# Patient Record
Sex: Female | Born: 1955 | Race: Black or African American | Hispanic: No | Smoking: Former smoker
Health system: Southern US, Community
[De-identification: ages and names within clinical notes are randomized; demographics above are authoritative.]

## PROBLEM LIST (undated history)

## (undated) DIAGNOSIS — G250 Essential tremor: Secondary | ICD-10-CM

## (undated) DIAGNOSIS — M797 Fibromyalgia: Secondary | ICD-10-CM

## (undated) DIAGNOSIS — I10 Essential (primary) hypertension: Secondary | ICD-10-CM

## (undated) DIAGNOSIS — E119 Type 2 diabetes mellitus without complications: Secondary | ICD-10-CM

## (undated) HISTORY — PX: BREAST REDUCTION SURGERY: SHX8

## (undated) HISTORY — DX: Essential (primary) hypertension: I10

## (undated) HISTORY — DX: Fibromyalgia: M79.7

## (undated) HISTORY — DX: Type 2 diabetes mellitus without complications: E11.9

## (undated) HISTORY — DX: Essential tremor: G25.0

---

## 2002-08-27 ENCOUNTER — Encounter: Admission: RE | Admit: 2002-08-27 | Discharge: 2002-11-25 | Payer: Self-pay | Admitting: Family Medicine

## 2003-07-19 ENCOUNTER — Emergency Department (HOSPITAL_COMMUNITY): Admission: EM | Admit: 2003-07-19 | Discharge: 2003-07-20 | Payer: Self-pay | Admitting: Emergency Medicine

## 2003-10-24 ENCOUNTER — Emergency Department (HOSPITAL_COMMUNITY): Admission: EM | Admit: 2003-10-24 | Discharge: 2003-10-24 | Payer: Self-pay

## 2003-11-11 ENCOUNTER — Ambulatory Visit: Payer: Self-pay | Admitting: Family Medicine

## 2003-11-18 ENCOUNTER — Ambulatory Visit: Payer: Self-pay | Admitting: *Deleted

## 2003-11-18 ENCOUNTER — Ambulatory Visit: Payer: Self-pay | Admitting: Family Medicine

## 2003-12-30 ENCOUNTER — Ambulatory Visit: Payer: Self-pay | Admitting: Family Medicine

## 2004-01-08 ENCOUNTER — Ambulatory Visit: Payer: Self-pay | Admitting: Family Medicine

## 2004-01-13 ENCOUNTER — Ambulatory Visit (HOSPITAL_COMMUNITY): Admission: RE | Admit: 2004-01-13 | Discharge: 2004-01-13 | Payer: Self-pay | Admitting: Family Medicine

## 2004-01-28 ENCOUNTER — Ambulatory Visit: Payer: Self-pay | Admitting: Family Medicine

## 2004-02-11 ENCOUNTER — Ambulatory Visit: Payer: Self-pay | Admitting: Family Medicine

## 2004-02-24 ENCOUNTER — Emergency Department (HOSPITAL_COMMUNITY): Admission: EM | Admit: 2004-02-24 | Discharge: 2004-02-24 | Payer: Self-pay | Admitting: Family Medicine

## 2004-03-19 ENCOUNTER — Ambulatory Visit: Payer: Self-pay | Admitting: Family Medicine

## 2004-03-29 ENCOUNTER — Ambulatory Visit: Payer: Self-pay | Admitting: Family Medicine

## 2004-04-22 ENCOUNTER — Ambulatory Visit: Payer: Self-pay | Admitting: Family Medicine

## 2004-04-29 ENCOUNTER — Ambulatory Visit (HOSPITAL_COMMUNITY): Admission: RE | Admit: 2004-04-29 | Discharge: 2004-04-29 | Payer: Self-pay | Admitting: Family Medicine

## 2004-07-13 ENCOUNTER — Emergency Department (HOSPITAL_COMMUNITY): Admission: EM | Admit: 2004-07-13 | Discharge: 2004-07-13 | Payer: Self-pay | Admitting: Family Medicine

## 2004-08-17 ENCOUNTER — Ambulatory Visit: Payer: Self-pay | Admitting: Family Medicine

## 2004-09-28 ENCOUNTER — Ambulatory Visit: Payer: Self-pay | Admitting: Family Medicine

## 2004-12-03 ENCOUNTER — Emergency Department (HOSPITAL_COMMUNITY): Admission: EM | Admit: 2004-12-03 | Discharge: 2004-12-03 | Payer: Self-pay | Admitting: Emergency Medicine

## 2004-12-06 ENCOUNTER — Ambulatory Visit: Payer: Self-pay | Admitting: Family Medicine

## 2006-05-17 ENCOUNTER — Other Ambulatory Visit: Admission: RE | Admit: 2006-05-17 | Discharge: 2006-05-17 | Payer: Self-pay | Admitting: Family Medicine

## 2006-06-01 ENCOUNTER — Ambulatory Visit (HOSPITAL_COMMUNITY): Admission: RE | Admit: 2006-06-01 | Discharge: 2006-06-01 | Payer: Self-pay | Admitting: Gastroenterology

## 2006-06-06 ENCOUNTER — Ambulatory Visit (HOSPITAL_COMMUNITY): Admission: RE | Admit: 2006-06-06 | Discharge: 2006-06-06 | Payer: Self-pay | Admitting: Family Medicine

## 2007-08-29 ENCOUNTER — Inpatient Hospital Stay (HOSPITAL_BASED_OUTPATIENT_CLINIC_OR_DEPARTMENT_OTHER): Admission: RE | Admit: 2007-08-29 | Discharge: 2007-08-29 | Payer: Self-pay | Admitting: Interventional Cardiology

## 2007-09-11 ENCOUNTER — Ambulatory Visit (HOSPITAL_COMMUNITY): Admission: RE | Admit: 2007-09-11 | Discharge: 2007-09-11 | Payer: Self-pay | Admitting: Family Medicine

## 2008-01-01 ENCOUNTER — Other Ambulatory Visit: Admission: RE | Admit: 2008-01-01 | Discharge: 2008-01-01 | Payer: Self-pay | Admitting: Family Medicine

## 2010-03-14 ENCOUNTER — Encounter: Payer: Self-pay | Admitting: Gastroenterology

## 2010-04-07 ENCOUNTER — Other Ambulatory Visit: Payer: Self-pay | Admitting: Obstetrics and Gynecology

## 2010-04-07 ENCOUNTER — Other Ambulatory Visit (HOSPITAL_COMMUNITY): Payer: Self-pay | Admitting: Family Medicine

## 2010-04-07 ENCOUNTER — Other Ambulatory Visit (HOSPITAL_COMMUNITY)
Admission: RE | Admit: 2010-04-07 | Discharge: 2010-04-07 | Disposition: A | Discharge status: Home / Self Care | Payer: Medicare Other | Source: Ambulatory Visit | Attending: Obstetrics and Gynecology | Admitting: Obstetrics and Gynecology

## 2010-04-07 DIAGNOSIS — Z01419 Encounter for gynecological examination (general) (routine) without abnormal findings: Secondary | ICD-10-CM | POA: Insufficient documentation

## 2010-04-07 DIAGNOSIS — Z1231 Encounter for screening mammogram for malignant neoplasm of breast: Secondary | ICD-10-CM

## 2010-04-07 DIAGNOSIS — Z113 Encounter for screening for infections with a predominantly sexual mode of transmission: Secondary | ICD-10-CM | POA: Insufficient documentation

## 2010-04-12 ENCOUNTER — Ambulatory Visit (HOSPITAL_COMMUNITY)
Admission: RE | Admit: 2010-04-12 | Discharge: 2010-04-12 | Disposition: A | Discharge status: Home / Self Care | Payer: Medicare Other | Source: Ambulatory Visit | Attending: Family Medicine | Admitting: Family Medicine

## 2010-04-12 ENCOUNTER — Encounter (HOSPITAL_COMMUNITY): Payer: Self-pay

## 2010-04-12 DIAGNOSIS — Z1231 Encounter for screening mammogram for malignant neoplasm of breast: Secondary | ICD-10-CM | POA: Insufficient documentation

## 2010-07-06 NOTE — Cardiovascular Report (Signed)
Dominique Russell, Dominique Russell              ACCOUNT NO.:  [id number]   MEDICAL RECORD NO.:  [id number]          PATIENT TYPE:  OIB   LOCATION:  1963                         FACILITY:  [facility]   PHYSICIAN:  Corky Crafts, MDDATE OF BIRTH:  25-Feb-1955   DATE OF PROCEDURE:  08/29/2007  DATE OF DISCHARGE:  08/29/2007                            CARDIAC CATHETERIZATION   REFERRING PHYSICIAN:  Tally Joe, MD   REASON FOR VISIT:  Abnormal Cardiolite, diabetes, and chest pain.   PROCEDURES PERFORMED:  Left heart catheterization, left ventriculogram,  coronary angiogram, and abdominal aortogram.   PROCEDURE IN DETAIL:  The risks and benefits of cardiac catheterization  were explained to the patient and informed consent was obtained.  She  was brought to the cardiac cath lab and prepped and draped in the usual  sterile fashion.  Her right groin was infiltrated with 1% lidocaine.  A  4-French sheath was placed into her right femoral artery using the  modified Seldinger technique.  Left coronary artery angiography was  performed using a JL4.0 catheter.  The catheter was advanced to the  vessel ostium under fluoroscopic guidance.  Digital angiography was  performed in multiple projections using hand injection of contrast.  Right coronary artery angiography was performed using a 3DRC catheter.  Digital angiography was performed in multiple projections using hand  injection of contrast.  Pigtail catheter was then advanced to the  ascending aorta and across the aortic valve under fluoroscopic guidance.  Power injection of contrast was performed in the 30-degree RAO position  to image the left ventricle.  The catheter was pulled back under  continuous hemodynamic pressure monitoring.  The catheter was then  withdrawn to the abdominal aorta and power injection of contrast was  performed in the AP projection to image the infrarenal aorta.  The  sheath was removed using manual compression.   FINDINGS:   The left main was a long vessel and widely patent.  The left  circumflex was a large vessel proximally.  There is a medium-sized  branching OM1, which appeared angiographically normal.  The left  anterior descending was a large vessel.  There are mild irregularities  in the mid vessel.  The first and second diagonals were medium-sized and  both widely patent.  The right coronary artery was a large dominant  vessel and appeared angiographically normal.  This vessel branched into  a posterolateral artery and posterior descending artery, both of which  appeared angiographically normal.  The left ventriculogram showed normal  left ventricular function.  No significant mitral regurgitation.  Estimated ejection fraction of 55%.  The abdominal aortogram showed no  abdominal aortic aneurysm.  There were bilateral single renal arteries,  both of which were widely patent.   HEMODYNAMICS:  Left ventricular pressure of 121/24 with an LVEDP of 20  mmHg.  Aortic pressure of 122/77 with a mean aortic pressure of 98 mmHg.   IMPRESSION:  1. No significant coronary artery disease.  2. Normal left ventricular function.  3. No renal artery stenosis.  4. No abdominal aortic aneurysm.  5. Mildly increased left ventricular end-diastolic pressure.  RECOMMENDATIONS:  The patient continue aggressive risk factor  modification including continued diabetes management.  We will hold her  Avandamet for the next 48 hours given the catheterization.  I will  follow up with her in about a week.      Corky Crafts, MD  Electronically Signed     JSV/MEDQ  D:  08/29/2007  T:  08/29/2007  Job:  [id number]

## 2010-07-09 NOTE — Op Note (Signed)
Dominique Russell, Dominique Russell              ACCOUNT NO.:  [id number]   MEDICAL RECORD NO.:  [id number]          PATIENT TYPE:  AMB   LOCATION:  ENDO                         FACILITY:  [facility]   PHYSICIAN:  Shirley Friar, MDDATE OF BIRTH:  [birth year]   DATE OF PROCEDURE:  06/01/2006  DATE OF DISCHARGE:                               OPERATIVE REPORT   PROCEDURE:  Screening colonoscopy.   INDICATIONS:  Screening.   MEDICATIONS:  Fentanyl 50 mcg IV, Versed 2.5 mg IV, propofol infusion  180 mcg per anesthesia.   FINDINGS:  Rectal exam was normal.  Adult Pentax colonoscope was  inserted into a fair prepped colon and advanced to the cecum.  The cecum  had a large amount of semi-formed stool that prevented visualization of  appendiceal orifice.  The ileocecal valve was identified.  The cecum and  right side of the colon, mainly proximal ascending colon was poor prep  and unable to identify any polyps due to the poor prep.  The remaining  prep of the colon was fair and no mucosal abnormalities were seen.  Retroflexion was unremarkable.   ASSESSMENT:  1. Poor prep of cecum and the ascending colon, otherwise normal      colonoscopy.   PLAN:  Air contrast barium enema to view right side of colon.      Shirley Friar, MD  Electronically Signed     VCS/MEDQ  D:  06/01/2006  T:  06/01/2006  Job:  [id number]   cc:   Deatra James, M.D.  Fax: [phone]

## 2011-03-02 DIAGNOSIS — M797 Fibromyalgia: Secondary | ICD-10-CM | POA: Insufficient documentation

## 2011-03-02 DIAGNOSIS — M159 Polyosteoarthritis, unspecified: Secondary | ICD-10-CM | POA: Insufficient documentation

## 2011-03-02 DIAGNOSIS — M069 Rheumatoid arthritis, unspecified: Secondary | ICD-10-CM | POA: Insufficient documentation

## 2011-03-29 ENCOUNTER — Ambulatory Visit (HOSPITAL_COMMUNITY)
Admission: RE | Admit: 2011-03-29 | Discharge: 2011-03-29 | Disposition: A | Discharge status: Home / Self Care | Payer: Medicare Other | Source: Ambulatory Visit | Attending: Rheumatology | Admitting: Rheumatology

## 2011-03-29 ENCOUNTER — Other Ambulatory Visit (HOSPITAL_COMMUNITY): Payer: Self-pay | Admitting: Rheumatology

## 2011-03-29 DIAGNOSIS — E119 Type 2 diabetes mellitus without complications: Secondary | ICD-10-CM | POA: Insufficient documentation

## 2011-03-29 DIAGNOSIS — Z01818 Encounter for other preprocedural examination: Secondary | ICD-10-CM | POA: Insufficient documentation

## 2011-08-31 ENCOUNTER — Other Ambulatory Visit: Payer: Self-pay | Admitting: Diagnostic Neuroimaging

## 2011-08-31 DIAGNOSIS — R259 Unspecified abnormal involuntary movements: Secondary | ICD-10-CM

## 2011-09-07 ENCOUNTER — Ambulatory Visit
Admission: RE | Admit: 2011-09-07 | Discharge: 2011-09-07 | Disposition: A | Discharge status: Home / Self Care | Payer: Medicare Other | Source: Ambulatory Visit | Attending: Diagnostic Neuroimaging | Admitting: Diagnostic Neuroimaging

## 2011-09-07 DIAGNOSIS — R259 Unspecified abnormal involuntary movements: Secondary | ICD-10-CM

## 2012-08-21 ENCOUNTER — Other Ambulatory Visit: Payer: Self-pay | Admitting: Diagnostic Neuroimaging

## 2012-09-26 ENCOUNTER — Ambulatory Visit (INDEPENDENT_AMBULATORY_CARE_PROVIDER_SITE_OTHER): Payer: Medicare Other | Admitting: Diagnostic Neuroimaging

## 2012-09-26 ENCOUNTER — Encounter: Payer: Self-pay | Admitting: Diagnostic Neuroimaging

## 2012-09-26 VITALS — BP 135/83 | HR 95 | Ht 64.0 in | Wt 287.0 lb

## 2012-09-26 DIAGNOSIS — G250 Essential tremor: Secondary | ICD-10-CM | POA: Insufficient documentation

## 2012-09-26 DIAGNOSIS — G252 Other specified forms of tremor: Secondary | ICD-10-CM

## 2012-09-26 DIAGNOSIS — R51 Headache: Secondary | ICD-10-CM

## 2012-09-26 NOTE — Patient Instructions (Signed)
Try increasing primidone to 100mg  every morning and 50mg  every evening.  I will check MRI and labs for headache evaluation.  Use tylenol or ibuprofen as needed for headaches.

## 2012-09-26 NOTE — Progress Notes (Signed)
[facility]  PATIENT: Dominique Russell DOB: 01-08-1956  REFERRING CLINICIAN:  HISTORY FROM: patient and daughter REASON FOR VISIT: follow up    HISTORICAL  CHIEF COMPLAINT:  Chief Complaint  Patient presents with  . Neurologic Problem    RV # 7    HISTORY OF PRESENT ILLNESS:   UPDATE 09/26/12: Tremor slightly progressing, esp with eating food using a fork. Also with new onset severe left sided headache (09/19/12) and went to [facility] for evaluation. CT head unremarkable. Headache is not present today, but is intermittent. Using aspirin and tylenol at home. No N/V/photo/phonophobia.  No similar prior HA.  UPDATE 03/14/12: Doing well, tremor much better with primidone. Taking 50mg  BID. No side effects. Satisfied with current dosing.  UPDATE 10/31/11: Doing better, now that depakote has been stopped. Also on lower gabapentin. Still with action tremor and anxiety. Still with insomnia.   PRIOR HPI: 57 year old right-handed female with history of hypertension, diabetes, fibromyalgia, depression, anxiety, rheumatoid arthritis, here for evaluation of tremor.  In August 2012, patient was visiting out of town, forgetting to take her gabapentin with her and ran out of this medicine 3 days. She thinks that this led to her developing bilateral hand tremor. When she came back home and resume gabapentin her tremor continued. She notices tremor when she drinks from a glass, uses utensils or right with a pen. Tremor has been progressively getting worse over time. She thinks worse when she is more anxious.  REVIEW OF SYSTEMS: Full 14 system review of systems performed and notable only for memory loss headache numbness slurred speech sleepiness anxiety not asleep decreased energy.  ALLERGIES: No Known Allergies  HOME MEDICATIONS: Outpatient Prescriptions Prior to Visit  Medication Sig Dispense Refill  . atorvastatin (LIPITOR) 20 MG tablet Take 20 mg by mouth daily. 1/2  tab qd      . BUPROPION HCL ER, XL, PO Take 300 mg by mouth daily.       . Cholecalciferol (VITAMIN D-3) 1000 UNITS CAPS Take by mouth.      . citalopram (CELEXA) 20 MG tablet Take 20 mg by mouth daily.      . cyclobenzaprine (FLEXERIL) 5 MG tablet Take 5 mg by mouth at bedtime as needed for muscle spasms.      . folic acid (FOLVITE) 800 MCG tablet Take 400 mcg by mouth daily.      Marland Kitchen gabapentin (NEURONTIN) 600 MG tablet Take 600 mg by mouth 4 (four) times daily.      . Glucosamine-Chondroit-Vit C-Mn (GLUCOSAMINE 1500 COMPLEX) CAPS Take by mouth.      . hydrochlorothiazide (HYDRODIURIL) 25 MG tablet Take 25 mg by mouth daily.      Marland Kitchen HYDROcodone-acetaminophen (LORCET) 10-650 MG per tablet Take 1 tablet by mouth 3 (three) times daily.      . hydroxychloroquine (PLAQUENIL) 200 MG tablet Take 200 mg by mouth 2 (two) times daily.      . insulin NPH-regular (NOVOLIN 70/30) (70-30) 100 UNIT/ML injection Inject into the skin. 30 units      . losartan (COZAAR) 100 MG tablet Take 100 mg by mouth daily.      . mirtazapine (REMERON) 15 MG tablet Take 15 mg by mouth at bedtime.      . Omega-3 Fatty Acids (FISH OIL PEARLS) 300 MG CAPS Take 1 tablet by mouth 2 (two) times daily.      . pioglitazone-metformin (ACTOPLUS MET) 15-850 MG per tablet Take 1 tablet by mouth 2 (  two) times daily with a meal.      . potassium gluconate 595 MG TABS tablet Take 595 mg by mouth.      . primidone (MYSOLINE) 50 MG tablet Take 1 tablet (50 mg total) by mouth 2 (two) times daily.  180 tablet  2  . vitamin E 400 UNIT capsule Take 400 Units by mouth daily.       No facility-administered medications prior to visit.    PAST MEDICAL HISTORY: Past Medical History  Diagnosis Date  . Hypertension   . Diabetes   . Fibromyalgia   . Essential tremor     PAST SURGICAL HISTORY: No past surgical history on file.  FAMILY HISTORY: Family History  Problem Relation Age of Onset  . Tremor Neg Hx     SOCIAL HISTORY:  History     Social History  . Marital Status: Legally Separated    Spouse Name: N/A    Number of Children: N/A  . Years of Education: N/A   Occupational History  . Not on file.   Social History Main Topics  . Smoking status: Former Smoker    Quit date: 02/22/1995  . Smokeless tobacco: Never Used  . Alcohol Use: No  . Drug Use: No  . Sexually Active: Not on file   Other Topics Concern  . Not on file   Social History Narrative   Patient lives at home with her daughter.   Caffeine Use: drinks regurlarly     PHYSICAL EXAM  Filed Vitals:   09/26/12 1340  BP: 135/83  Pulse: 95  Height: 5\' 4"  (1.626 m)  Weight: 287 lb (130.182 kg)    Not recorded    Body mass index is 49.24 kg/(m^2).  GENERAL EXAM: General: Patient is awake. No acute distress.  Well developed and groomed. Neck: Neck is supple. Cardiovascular: No carotid artery bruits.  Heart is regular rate and rhythm with no murmurs.  Neurologic Exam  Mental Status: Awake, alert. Language is fluent and comprehension intact. Cranial Nerves: Pupils are equal and reactive to light.  Visual fields are full to confrontation.  Conjugate eye movements are full and symmetric.  Facial sensation and strength are symmetric.  Hearing is intact.  Palate elevated symmetrically and uvula is midline.  Shoulder shrug is symmetric.  Tongue is midline. Motor: NO POSTURAL OR REST TREMOR. MILD ACTION TREMOR (L > R; ESP WITH HAND WRITING). Normal bulk and tone.  Full strength in the upper and lower extremities.  No pronator drift. Sensory: Intact and symmetric to light touch. Coordination: No ataxia or dysmetria on finger-nose or rapid alternating movement testing. Gait and Station: CAUTIOUS GAIT. Romberg is negative. Reflexes: Deep tendon reflexes in the upper and lower extremity are TRACE and symmetric; ABSENT AT ANKLES.   DIAGNOSTIC DATA (LABS, IMAGING, TESTING) - I reviewed patient records, labs, notes, testing and imaging myself where  available.  No results found for this basename: WBC,  HGB,  HCT,  MCV,  PLT   No results found for this basename: na,  k,  cl,  co2,  glucose,  bun,  creatinine,  calcium,  prot,  albumin,  ast,  alt,  alkphos,  bilitot,  gfrnonaa,  gfraa   No results found for this basename: CHOL,  HDL,  LDLCALC,  LDLDIRECT,  TRIG,  CHOLHDL   No results found for this basename: HGBA1C   No results found for this basename: VITAMINB12   No results found for this basename: TSH  ASSESSMENT AND PLAN  57 y.o. female with progressive postural and action tremor. Tremor has improved somewhat since stoping depakote and reducing gabapentin. She likely has underlying essential tremor as well.  TSH 1.18  Ddx tremor: essential tremor + polypharmacy  Ddx headache: migraine, temporal arteritis, secondary headache   PLAN: 1. increase primidone to 100mg  qAM and 50mg  qPM 2. MRI brain , ESR, CRP for headache eval   Orders Placed This Encounter  Procedures  . MR Brain W Wo Contrast  . Sedimentation Rate  . C-reactive Protein     Suanne Marker, MD 09/26/2012, 2:07 PM Certified in Neurology, Neurophysiology and Neuroimaging  [facility] [address] [place], [place] [address] [phone]

## 2012-10-26 DIAGNOSIS — R51 Headache: Secondary | ICD-10-CM

## 2012-11-02 ENCOUNTER — Other Ambulatory Visit: Payer: Self-pay | Admitting: Diagnostic Neuroimaging

## 2012-11-02 DIAGNOSIS — G250 Essential tremor: Secondary | ICD-10-CM

## 2012-11-02 DIAGNOSIS — R51 Headache: Secondary | ICD-10-CM

## 2012-12-26 DIAGNOSIS — M25511 Pain in right shoulder: Secondary | ICD-10-CM | POA: Insufficient documentation

## 2012-12-26 DIAGNOSIS — M25561 Pain in right knee: Secondary | ICD-10-CM | POA: Insufficient documentation

## 2012-12-28 ENCOUNTER — Ambulatory Visit: Payer: Medicare Other | Admitting: Diagnostic Neuroimaging

## 2013-01-23 DIAGNOSIS — M545 Low back pain, unspecified: Secondary | ICD-10-CM | POA: Insufficient documentation

## 2013-01-23 DIAGNOSIS — E1142 Type 2 diabetes mellitus with diabetic polyneuropathy: Secondary | ICD-10-CM | POA: Insufficient documentation

## 2013-01-23 DIAGNOSIS — M25519 Pain in unspecified shoulder: Secondary | ICD-10-CM | POA: Insufficient documentation

## 2013-01-23 DIAGNOSIS — E119 Type 2 diabetes mellitus without complications: Secondary | ICD-10-CM | POA: Insufficient documentation

## 2013-01-31 ENCOUNTER — Ambulatory Visit (INDEPENDENT_AMBULATORY_CARE_PROVIDER_SITE_OTHER): Payer: Medicare Other | Admitting: Diagnostic Neuroimaging

## 2013-01-31 ENCOUNTER — Encounter: Payer: Self-pay | Admitting: Diagnostic Neuroimaging

## 2013-01-31 ENCOUNTER — Encounter (INDEPENDENT_AMBULATORY_CARE_PROVIDER_SITE_OTHER): Payer: Self-pay

## 2013-01-31 VITALS — BP 137/82 | HR 95 | Temp 98.0°F | Ht 64.0 in | Wt 287.0 lb

## 2013-01-31 DIAGNOSIS — G250 Essential tremor: Secondary | ICD-10-CM

## 2013-01-31 MED ORDER — PRIMIDONE 50 MG PO TABS: 100.0000 mg | ORAL_TABLET | Freq: Two times a day (BID) | ORAL | Status: DC

## 2013-01-31 NOTE — Patient Instructions (Signed)
-    Increase primidone to 100mg twice a day 

## 2013-01-31 NOTE — Progress Notes (Signed)
[facility]  PATIENT: Dominique Russell DOB: [birth year]  REFERRING CLINICIAN:  HISTORY FROM: patient and daughter REASON FOR VISIT: follow up    HISTORICAL  CHIEF COMPLAINT:  Chief Complaint  Patient presents with  . Follow-up    essential tremor    HISTORY OF PRESENT ILLNESS:   UPDATE 01/31/13: Patient's headache has resolved. MRI of the brain comment ESR and CRP were unremarkable. Patient's tremor has continued. She has particular difficulty holding up today, holding a cup of coffee a spoon or fork. Patient is taking primidone 50 mg morning 100 mg a day. Patient also complaining of acid reflux symptoms.  UPDATE 09/26/12: Tremor slightly progressing, esp with eating food using a fork. Also with new onset severe left sided headache (09/19/12) and went to [facility] for evaluation. CT head unremarkable. Headache is not present today, but is intermittent. Using aspirin and tylenol at home. No N/V/photo/phonophobia.  No similar prior HA.  UPDATE 03/14/12: Doing well, tremor much better with primidone. Taking 50mg  BID. No side effects. Satisfied with current dosing.  UPDATE 10/31/11: Doing better, now that depakote has been stopped. Also on lower gabapentin. Still with action tremor and anxiety. Still with insomnia.   PRIOR HPI: 57 year old right-handed female with history of hypertension, diabetes, fibromyalgia, depression, anxiety, rheumatoid arthritis, here for evaluation of tremor.  In August 2012, patient was visiting out of town, forgetting to take her gabapentin with her and ran out of this medicine 3 days. She thinks that this led to her developing bilateral hand tremor. When she came back home and resume gabapentin her tremor continued. She notices tremor when she drinks from a glass, uses utensils or right with a pen. Tremor has been progressively getting worse over time. She thinks worse when she is more anxious.  REVIEW OF SYSTEMS: Full 14 system  review of systems performed and notable only for aching muscles depression nervousness anxiety.   ALLERGIES: No Known Allergies  HOME MEDICATIONS: Outpatient Prescriptions Prior to Visit  Medication Sig Dispense Refill  . ALPRAZolam (XANAX) 0.5 MG tablet       . atorvastatin (LIPITOR) 20 MG tablet Take 20 mg by mouth daily. 1/2 tab qd      . BUPROPION HCL ER, XL, PO Take 300 mg by mouth daily.       . Cholecalciferol (VITAMIN D-3) 1000 UNITS CAPS Take by mouth.      . citalopram (CELEXA) 20 MG tablet Take 20 mg by mouth daily.      . cyclobenzaprine (FLEXERIL) 5 MG tablet Take 5 mg by mouth at bedtime as needed for muscle spasms.      . folic acid (FOLVITE) 800 MCG tablet Take 400 mcg by mouth daily.      Marland Kitchen gabapentin (NEURONTIN) 600 MG tablet Take 600 mg by mouth 4 (four) times daily.      . Glucosamine-Chondroit-Vit C-Mn (GLUCOSAMINE 1500 COMPLEX) CAPS Take by mouth.      . hydrochlorothiazide (HYDRODIURIL) 25 MG tablet Take 25 mg by mouth daily.      . insulin NPH-regular (NOVOLIN 70/30) (70-30) 100 UNIT/ML injection Inject into the skin. 30 units      . losartan (COZAAR) 100 MG tablet Take 100 mg by mouth daily.      . mirtazapine (REMERON) 15 MG tablet Take 15 mg by mouth at bedtime.      . Omega-3 Fatty Acids (FISH OIL PEARLS) 300 MG CAPS Take 1 tablet by mouth 2 (two) times  daily.      . pioglitazone-metformin (ACTOPLUS MET) 15-850 MG per tablet Take 1 tablet by mouth 2 (two) times daily with a meal.      . potassium gluconate 595 MG TABS tablet Take 595 mg by mouth.      . vitamin E 400 UNIT capsule Take 400 Units by mouth daily.      Marland Kitchen HYDROcodone-acetaminophen (LORCET) 10-650 MG per tablet Take 1 tablet by mouth 3 (three) times daily.      . primidone (MYSOLINE) 50 MG tablet Take 1 tablet (50 mg total) by mouth 2 (two) times daily.  180 tablet  2  . hydroxychloroquine (PLAQUENIL) 200 MG tablet Take 200 mg by mouth 2 (two) times daily.       No facility-administered medications  prior to visit.    PAST MEDICAL HISTORY: Past Medical History  Diagnosis Date  . Hypertension   . Diabetes   . Fibromyalgia   . Essential tremor     PAST SURGICAL HISTORY: History reviewed. No pertinent past surgical history.  FAMILY HISTORY: Family History  Problem Relation Age of Onset  . Tremor Neg Hx     SOCIAL HISTORY:  History   Social History  . Marital Status: Legally Separated    Spouse Name: N/A    Number of Children: 2  . Years of Education: HS   Occupational History  . Not on file.   Social History Main Topics  . Smoking status: Former Smoker    Quit date: 02/22/1995  . Smokeless tobacco: Never Used  . Alcohol Use: No  . Drug Use: No  . Sexual Activity: Not on file   Other Topics Concern  . Not on file   Social History Narrative   Patient lives at home with her daughter.   Caffeine Use: drinks regurlarly     PHYSICAL EXAM  Filed Vitals:   01/31/13 1444  BP: 137/82  Pulse: 95  Temp: 98 F (36.7 C)  TempSrc: Oral  Height: 5\' 4"  (1.626 m)  Weight: 287 lb (130.182 kg)    Not recorded    Body mass index is 49.24 kg/(m^2).  GENERAL EXAM: General: Patient is awake. No acute distress.  Well developed and groomed. Neck: Neck is supple. Cardiovascular: No carotid artery bruits.  Heart is regular rate and rhythm with no murmurs.  Neurologic Exam  Mental Status: Awake, alert. Language is fluent and comprehension intact. Cranial Nerves: Pupils are equal and reactive to light.  Visual fields are full to confrontation.  Conjugate eye movements are full and symmetric.  Facial sensation and strength are symmetric.  Hearing is intact.  Palate elevated symmetrically and uvula is midline.  Shoulder shrug is symmetric.  Tongue is midline. Motor: NO POSTURAL OR REST TREMOR. Normal bulk and tone.  Full strength in the upper and lower extremities.  No pronator drift. Sensory: Intact and symmetric to light touch. Coordination: MILD ACTION TREMOR (L >  R; ESP WITH HAND WRITING). No ataxia or dysmetria on finger-nose or rapid alternating movement testing. Gait and Station: CAUTIOUS GAIT. Romberg is negative. Reflexes: Deep tendon reflexes in the upper and lower extremity are TRACE and symmetric; ABSENT AT ANKLES.   DIAGNOSTIC DATA (LABS, IMAGING, TESTING) - I reviewed patient records, labs, notes, testing and imaging myself where available.  No results found for this basename: WBC,  HGB,  HCT,  MCV,  PLT   No results found for this basename: na,  k,  cl,  co2,  glucose,  bun,  creatinine,  calcium,  prot,  albumin,  ast,  alt,  alkphos,  bilitot,  gfrnonaa,  gfraa   No results found for this basename: CHOL,  HDL,  LDLCALC,  LDLDIRECT,  TRIG,  CHOLHDL   No results found for this basename: HGBA1C   No results found for this basename: VITAMINB12   No results found for this basename: TSH    Sed Rate  Date Value Range Status  09/26/2012 27  0 - 40 mm/hr Final   CRP  Date Value Range Status  09/26/2012 2.3  0.0 - 4.9 mg/L Final    I reviewed images myself.   11/02/12 MRI brain - normal   ASSESSMENT AND PLAN  57 y.o. female with progressive postural and action tremor. Tremor has improved somewhat since stoping depakote and reducing gabapentin. She likely has underlying essential tremor as well.  Dx tremor: essential tremor (worsening)  Dx headache: migraine vs tension HA (resolved)  PLAN: 1. increase primidone to 100mg  BID  Return in about 3 months (around 05/01/2013) for with Heide Guile or Penumalli.    Suanne Marker, MD 01/31/2013, 3:23 PM Certified in Neurology, Neurophysiology and Neuroimaging  [facility] [address] [place], [place] [address] [phone]

## 2013-03-06 ENCOUNTER — Telehealth: Payer: Self-pay | Admitting: Diagnostic Neuroimaging

## 2013-03-06 MED ORDER — PRIMIDONE 50 MG PO TABS: 100.0000 mg | ORAL_TABLET | Freq: Two times a day (BID) | ORAL | Status: DC

## 2013-03-06 NOTE — Telephone Encounter (Signed)
NEEDS RX CALLED IN FOR PREMIDONE--[facility] [address]-NEEDS 3 MONTHS SUPPLY

## 2013-03-06 NOTE — Telephone Encounter (Signed)
Rx has been sent  

## 2013-03-06 NOTE — Telephone Encounter (Signed)
NEEDS REFILL FOR PREMIDONE-[facility]-NEEDS 3 MONTHS SUPPLY

## 2013-05-01 ENCOUNTER — Telehealth: Payer: Self-pay | Admitting: Nurse Practitioner

## 2013-05-01 ENCOUNTER — Ambulatory Visit: Payer: Medicare Other | Admitting: Nurse Practitioner

## 2013-05-01 NOTE — Telephone Encounter (Signed)
Patient was no show for appointment

## 2013-07-31 ENCOUNTER — Telehealth: Payer: Self-pay | Admitting: Diagnostic Neuroimaging

## 2013-07-31 ENCOUNTER — Other Ambulatory Visit: Payer: Self-pay | Admitting: Diagnostic Neuroimaging

## 2013-07-31 MED ORDER — PRIMIDONE 50 MG PO TABS: 100.0000 mg | ORAL_TABLET | Freq: Two times a day (BID) | ORAL | Status: DC

## 2013-07-31 NOTE — Telephone Encounter (Signed)
Called pt and left message informing her that her Rx was sent over to her Pharmacy and if she has any other problems, questions or concerns to call the office.

## 2013-07-31 NOTE — Telephone Encounter (Signed)
Patient requesting refill for primidone (MYSOLINE) 50 MG tablet.  She stated Dr Marjory Lies instructed her to take 2 tabs in am and pm, and she has run out before next refill is due.  Please call and advise

## 2013-09-26 NOTE — Telephone Encounter (Signed)
Noted  

## 2014-01-03 ENCOUNTER — Other Ambulatory Visit: Payer: Self-pay | Admitting: Diagnostic Neuroimaging

## 2014-01-06 ENCOUNTER — Other Ambulatory Visit (HOSPITAL_COMMUNITY): Payer: Self-pay | Admitting: Family Medicine

## 2014-01-06 DIAGNOSIS — Z1231 Encounter for screening mammogram for malignant neoplasm of breast: Secondary | ICD-10-CM

## 2014-01-14 ENCOUNTER — Ambulatory Visit (HOSPITAL_COMMUNITY)
Admission: RE | Admit: 2014-01-14 | Discharge: 2014-01-14 | Disposition: A | Discharge status: Home / Self Care | Payer: Medicare Other | Source: Ambulatory Visit | Attending: Family Medicine | Admitting: Family Medicine

## 2014-01-14 DIAGNOSIS — Z1231 Encounter for screening mammogram for malignant neoplasm of breast: Secondary | ICD-10-CM

## 2014-01-27 ENCOUNTER — Other Ambulatory Visit: Payer: Self-pay | Admitting: Diagnostic Neuroimaging

## 2014-01-27 NOTE — Telephone Encounter (Signed)
Patient no showed last appt  

## 2014-01-28 ENCOUNTER — Other Ambulatory Visit: Payer: Self-pay | Admitting: Obstetrics & Gynecology

## 2014-01-28 ENCOUNTER — Other Ambulatory Visit (HOSPITAL_COMMUNITY)
Admission: RE | Admit: 2014-01-28 | Discharge: 2014-01-28 | Disposition: A | Discharge status: Home / Self Care | Payer: Medicare Other | Source: Ambulatory Visit | Attending: Obstetrics & Gynecology | Admitting: Obstetrics & Gynecology

## 2014-01-28 DIAGNOSIS — Z1151 Encounter for screening for human papillomavirus (HPV): Secondary | ICD-10-CM | POA: Insufficient documentation

## 2014-01-28 DIAGNOSIS — Z124 Encounter for screening for malignant neoplasm of cervix: Secondary | ICD-10-CM | POA: Insufficient documentation

## 2014-01-28 NOTE — Telephone Encounter (Signed)
I called th epatient, got no answer.  Left message.  

## 2014-01-29 ENCOUNTER — Telehealth: Payer: Self-pay | Admitting: Diagnostic Neuroimaging

## 2014-01-29 NOTE — Telephone Encounter (Signed)
Pharmacist, Annabelle Harman with [facility] @ [phone], checking status of Rx refill request for primidone (MYSOLINE) 50 MG tablet.  Please call and advise.

## 2014-01-29 NOTE — Telephone Encounter (Signed)
We have called the patient and left a message.  I called the pharmacy back.  They will try to reach out to the patient as well.

## 2014-01-30 ENCOUNTER — Encounter: Payer: Self-pay | Admitting: Diagnostic Neuroimaging

## 2014-01-30 ENCOUNTER — Ambulatory Visit (INDEPENDENT_AMBULATORY_CARE_PROVIDER_SITE_OTHER): Payer: Medicare Other | Admitting: Diagnostic Neuroimaging

## 2014-01-30 VITALS — BP 130/86 | HR 83 | Temp 97.8°F | Ht 64.0 in | Wt 270.4 lb

## 2014-01-30 DIAGNOSIS — G250 Essential tremor: Secondary | ICD-10-CM

## 2014-01-30 LAB — CYTOLOGY - PAP

## 2014-01-30 MED ORDER — TOPIRAMATE 50 MG PO TABS: 50.0000 mg | ORAL_TABLET | Freq: Two times a day (BID) | ORAL | Status: DC

## 2014-01-30 NOTE — Patient Instructions (Signed)
Continue primidone.  Add topiramate 50mg  twice a day.

## 2014-01-30 NOTE — Progress Notes (Signed)
[facility]  PATIENT: Dominique Russell DOB: 1955-11-01  REFERRING CLINICIAN:  HISTORY FROM: patient and daughter REASON FOR VISIT: follow up    HISTORICAL  CHIEF COMPLAINT:  Chief Complaint  Patient presents with  . Follow-up    tremor    HISTORY OF PRESENT ILLNESS:   UPDATE 12/101/15: Since last visit, doing about the same. Tremor stable, but not better since increasing primidone last year. Some sleepiness with primidone.  UPDATE 01/31/13: Patient's headache has resolved. MRI of the brain comment ESR and CRP were unremarkable. Patient's tremor has continued. She has particular difficulty holding up today, holding a cup of coffee a spoon or fork. Patient is taking primidone 50 mg morning 100 mg a day. Patient also complaining of acid reflux symptoms.  UPDATE 09/26/12: Tremor slightly progressing, esp with eating food using a fork. Also with new onset severe left sided headache (09/19/12) and went to [place] for evaluation. CT head unremarkable. Headache is not present today, but is intermittent. Using aspirin and tylenol at home. No N/V/photo/phonophobia.  No similar prior HA.  UPDATE 03/14/12: Doing well, tremor much better with primidone. Taking 30m BID. No side effects. Satisfied with current dosing.  UPDATE 10/31/11: Doing better, now that depakote has been stopped. Also on lower gabapentin. Still with action tremor and anxiety. Still with insomnia.   PRIOR HPI: 58year old right-handed female with history of hypertension, diabetes, fibromyalgia, depression, anxiety, rheumatoid arthritis, here for evaluation of tremor.  In August 2012, patient was visiting out of town, forgetting to take her gabapentin with her and ran out of this medicine 3 days. She thinks that this led to her developing bilateral hand tremor. When she came back home and resume gabapentin her tremor continued. She notices tremor when she drinks from a glass, uses utensils or right  with a pen. Tremor has been progressively getting worse over time. She thinks worse when she is more anxious.  REVIEW OF SYSTEMS: Full 14 system review of systems performed and notable only for headache numbness depression anxiety cramps joint pain back pain restless legs.   ALLERGIES: No Known Allergies  HOME MEDICATIONS: Outpatient Prescriptions Prior to Visit  Medication Sig Dispense Refill  . ALPRAZolam (XANAX) 0.5 MG tablet     . atorvastatin (LIPITOR) 20 MG tablet Take 20 mg by mouth daily. 1/2 tab qd    . BUPROPION HCL ER, XL, PO Take 300 mg by mouth daily.     . Cholecalciferol (VITAMIN D-3) 1000 UNITS CAPS Take by mouth.    . citalopram (CELEXA) 20 MG tablet Take 20 mg by mouth daily.    . cyclobenzaprine (FLEXERIL) 5 MG tablet Take 5 mg by mouth at bedtime as needed for muscle spasms.    . folic acid (FOLVITE) 8245MCG tablet Take 400 mcg by mouth daily.    .Marland Kitchengabapentin (NEURONTIN) 600 MG tablet Take 600 mg by mouth 4 (four) times daily.    . Glucosamine-Chondroit-Vit C-Mn (GLUCOSAMINE 1500 COMPLEX) CAPS Take by mouth.    . hydrochlorothiazide (HYDRODIURIL) 25 MG tablet Take 25 mg by mouth daily.    . insulin NPH-regular (NOVOLIN 70/30) (70-30) 100 UNIT/ML injection Inject into the skin. 30 units    . losartan (COZAAR) 100 MG tablet Take 100 mg by mouth daily.    . mirtazapine (REMERON) 15 MG tablet Take 15 mg by mouth at bedtime.    . Omega-3 Fatty Acids (FISH OIL PEARLS) 300 MG CAPS Take 1 tablet by mouth  2 (two) times daily.    . pioglitazone-metformin (ACTOPLUS MET) 15-850 MG per tablet Take 1 tablet by mouth 2 (two) times daily with a meal.    . potassium gluconate 595 MG TABS tablet Take 595 mg by mouth.    . primidone (MYSOLINE) 50 MG tablet Take 2 tablets (100 mg total) by mouth 2 (two) times daily. 60 tablet 0  . vitamin E 400 UNIT capsule Take 400 Units by mouth daily.     No facility-administered medications prior to visit.    PAST MEDICAL HISTORY: Past Medical  History  Diagnosis Date  . Hypertension   . Diabetes   . Fibromyalgia   . Essential tremor     PAST SURGICAL HISTORY: No past surgical history on file.  FAMILY HISTORY: Family History  Problem Relation Age of Onset  . Tremor Neg Hx     SOCIAL HISTORY:  History   Social History  . Marital Status: Legally Separated    Spouse Name: N/A    Number of Children: 2  . Years of Education: HS   Occupational History  . Not on file.   Social History Main Topics  . Smoking status: Former Smoker    Quit date: 02/22/1995  . Smokeless tobacco: Never Used  . Alcohol Use: No  . Drug Use: No  . Sexual Activity: Not on file   Other Topics Concern  . Not on file   Social History Narrative   Patient lives at home with her daughter.   Caffeine Use: drinks regurlarly     PHYSICAL EXAM  Filed Vitals:   01/30/14 1530  BP: 130/86  Pulse: 83  Temp: 97.8 F (36.6 C)  TempSrc: Oral  Height: 5' 4" (1.626 m)  Weight: 270 lb 6.4 oz (122.653 kg)    Not recorded      Body mass index is 46.39 kg/(m^2).  GENERAL EXAM: General: Patient is awake. No acute distress.  Well developed and groomed. Neck: Neck is supple. Cardiovascular: No carotid artery bruits.  Heart is regular rate and rhythm with no murmurs.  Neurologic Exam  Mental Status: Awake, alert. Language is fluent and comprehension intact. Cranial Nerves: Pupils are equal and reactive to light.  Visual fields are full to confrontation.  Conjugate eye movements are full and symmetric.  Facial sensation and strength are symmetric.  Hearing is intact.  Palate elevated symmetrically and uvula is midline.  Shoulder shrug is symmetric.  Tongue is midline. Motor: NO POSTURAL OR REST TREMOR. Normal bulk and tone.  Full strength in the upper and lower extremities.  No pronator drift. Sensory: Intact and symmetric to light touch. Coordination: MILD ACTION TREMOR (L > R; ESP WITH HAND WRITING). No ataxia or dysmetria on finger-nose or  rapid alternating movement testing. Gait and Station: CAUTIOUS GAIT. Romberg is negative. Reflexes: Deep tendon reflexes in the upper and lower extremity are TRACE and symmetric; ABSENT AT ANKLES.   DIAGNOSTIC DATA (LABS, IMAGING, TESTING) - I reviewed patient records, labs, notes, testing and imaging myself where available.  No results found for: WBC No results found for: NA No results found for: CHOL No results found for: HGBA1C No results found for: VITAMINB12 No results found for: TSH  SED RATE  Date Value Ref Range Status  09/26/2012 27 0 - 40 mm/hr Final   CRP  Date Value Ref Range Status  09/26/2012 2.3 0.0 - 4.9 mg/L Final    I reviewed images myself.   11/02/12 MRI brain - normal  ASSESSMENT AND PLAN  58 y.o. female with progressive postural and action tremor. Tremor has improved somewhat since stoping depakote and reducing gabapentin. She likely has underlying essential tremor as well.  Dx tremor: essential tremor (worsening)  Dx headache: migraine vs tension HA (resolved)  PLAN: 1. Continue primidone to 119m BID 2. Add on topiramate 568mBID for tremor and migraine control  Meds ordered this encounter  Medications  . topiramate (TOPAMAX) 50 MG tablet    Sig: Take 1 tablet (50 mg total) by mouth 2 (two) times daily.    Dispense:  60 tablet    Refill:  12   Return in about 6 months (around 08/01/2014).    VIPenni BombardMD [id number]:2:95M Certified in Neurology, Neurophysiology and Neuroimaging  [facility] [address]SuPrairie CityrWoodlawn[place] [id number]

## 2014-05-16 DIAGNOSIS — Z6841 Body Mass Index (BMI) 40.0 and over, adult: Secondary | ICD-10-CM

## 2014-07-01 ENCOUNTER — Telehealth: Payer: Self-pay

## 2014-07-01 NOTE — Telephone Encounter (Signed)
Pt was contacted to reschedule appointment from bump list. VM was left informing pt to call back.  When rescheduling please schedule with Dr. Marjory Lies in a 20 min slot.  Thanks

## 2014-07-31 ENCOUNTER — Ambulatory Visit: Payer: Medicare Other | Admitting: Diagnostic Neuroimaging

## 2014-08-06 ENCOUNTER — Ambulatory Visit (INDEPENDENT_AMBULATORY_CARE_PROVIDER_SITE_OTHER): Payer: Medicare Other | Admitting: Diagnostic Neuroimaging

## 2014-08-06 ENCOUNTER — Encounter: Payer: Self-pay | Admitting: Diagnostic Neuroimaging

## 2014-08-06 ENCOUNTER — Telehealth: Payer: Self-pay | Admitting: *Deleted

## 2014-08-06 VITALS — BP 126/88 | HR 92 | Ht 64.0 in | Wt 258.2 lb

## 2014-08-06 DIAGNOSIS — G250 Essential tremor: Secondary | ICD-10-CM | POA: Diagnosis not present

## 2014-08-06 DIAGNOSIS — R51 Headache: Secondary | ICD-10-CM

## 2014-08-06 DIAGNOSIS — R519 Headache, unspecified: Secondary | ICD-10-CM

## 2014-08-06 MED ORDER — TOPIRAMATE 100 MG PO TABS: 100.0000 mg | ORAL_TABLET | Freq: Two times a day (BID) | ORAL | Status: DC

## 2014-08-06 NOTE — Patient Instructions (Signed)
Increase topiramate to 100mg twice a day. 

## 2014-08-06 NOTE — Progress Notes (Signed)
[facility]  PATIENT: Dominique Russell DOB: [birth year]  REFERRING CLINICIAN:  HISTORY FROM: patient and daughter REASON FOR VISIT: follow up    HISTORICAL  CHIEF COMPLAINT:  Chief Complaint  Patient presents with  . Tremors    rm 7, daughter, "temors not getting better"  . Follow-up    HISTORY OF PRESENT ILLNESS:   UPDATE 08/06/14: Since last visit, tremor stable to slightly worse. HA much better. Having diff with ADLs, transportation.   UPDATE 12/101/15: Since last visit, doing about the same. Tremor stable, but not better since increasing primidone last year. Some sleepiness with primidone.  UPDATE 01/31/13: Patient's headache has resolved. MRI of the brain comment ESR and CRP were unremarkable. Patient's tremor has continued. She has particular difficulty holding up today, holding a cup of coffee a spoon or fork. Patient is taking primidone 50 mg morning 100 mg a day. Patient also complaining of acid reflux symptoms.  UPDATE 09/26/12: Tremor slightly progressing, esp with eating food using a fork. Also with new onset severe left sided headache (09/19/12) and went to [place] for evaluation. CT head unremarkable. Headache is not present today, but is intermittent. Using aspirin and tylenol at home. No N/V/photo/phonophobia.  No similar prior HA.  UPDATE 03/14/12: Doing well, tremor much better with primidone. Taking 57m BID. No side effects. Satisfied with current dosing.  UPDATE 10/31/11: Doing better, now that depakote has been stopped. Also on lower gabapentin. Still with action tremor and anxiety. Still with insomnia.   PRIOR HPI: 59year old right-handed female with history of hypertension, diabetes, fibromyalgia, depression, anxiety, rheumatoid arthritis, here for evaluation of tremor. In August 2012, patient was visiting out of town, forgetting to take her gabapentin with her and ran out of this medicine 3 days. She thinks that this led to her  developing bilateral hand tremor. When she came back home and resume gabapentin her tremor continued. She notices tremor when she drinks from a glass, uses utensils or right with a pen. Tremor has been progressively getting worse over time. She thinks worse when she is more anxious.  REVIEW OF SYSTEMS: Full 14 system review of systems performed and notable only for headache numbness depression anxiety cramps joint pain back pain restless legs.   ALLERGIES: No Known Allergies  HOME MEDICATIONS: Outpatient Prescriptions Prior to Visit  Medication Sig Dispense Refill  . ALPRAZolam (XANAX) 0.5 MG tablet     . atorvastatin (LIPITOR) 20 MG tablet Take 20 mg by mouth daily. 1/2 tab qd    . BUPROPION HCL ER, XL, PO Take 300 mg by mouth daily.     . Cholecalciferol (VITAMIN D-3) 1000 UNITS CAPS Take by mouth.    . citalopram (CELEXA) 20 MG tablet Take 20 mg by mouth daily.    . cyclobenzaprine (FLEXERIL) 5 MG tablet Take 5 mg by mouth at bedtime as needed for muscle spasms.    . folic acid (FOLVITE) 8329MCG tablet Take 400 mcg by mouth daily.    .Marland Kitchengabapentin (NEURONTIN) 600 MG tablet Take 600 mg by mouth 4 (four) times daily.    . Glucosamine-Chondroit-Vit C-Mn (GLUCOSAMINE 1500 COMPLEX) CAPS Take by mouth.    . hydrochlorothiazide (HYDRODIURIL) 25 MG tablet Take 25 mg by mouth daily.    .Marland Kitchenlosartan (COZAAR) 100 MG tablet Take 100 mg by mouth daily.    .Marland KitchenLYRICA 150 MG capsule Take 1 tablet by mouth 3 (three) times daily.    . mirtazapine (REMERON) 15  MG tablet Take 15 mg by mouth at bedtime.    . Omega-3 Fatty Acids (FISH OIL PEARLS) 300 MG CAPS Take 1 tablet by mouth 2 (two) times daily.    Marland Kitchen omeprazole (PRILOSEC) 40 MG capsule Take 1 capsule by mouth daily.    . pioglitazone-metformin (ACTOPLUS MET) 15-850 MG per tablet Take 1 tablet by mouth 2 (two) times daily with a meal.    . potassium gluconate 595 MG TABS tablet Take 595 mg by mouth.    . primidone (MYSOLINE) 50 MG tablet TAKE 2 TABLETS  (100 MG TOTAL) BY MOUTH 2 (TWO) TIMES DAILY. 60 tablet 12  . vitamin E 400 UNIT capsule Take 400 Units by mouth daily.    Marland Kitchen topiramate (TOPAMAX) 50 MG tablet Take 1 tablet (50 mg total) by mouth 2 (two) times daily. 60 tablet 12  . insulin NPH-regular (NOVOLIN 70/30) (70-30) 100 UNIT/ML injection Inject into the skin. 30 units     No facility-administered medications prior to visit.    PAST MEDICAL HISTORY: Past Medical History  Diagnosis Date  . Hypertension   . Diabetes   . Fibromyalgia   . Essential tremor     PAST SURGICAL HISTORY: No past surgical history on file.  FAMILY HISTORY: Family History  Problem Relation Age of Onset  . Tremor Neg Hx   . Depression Mother   . Hypertension Mother   . Kidney failure Brother     SOCIAL HISTORY:  History   Social History  . Marital Status: Legally Separated    Spouse Name: N/A  . Number of Children: 2  . Years of Education: HS   Occupational History  . Not on file.   Social History Main Topics  . Smoking status: Former Smoker    Quit date: 02/22/1995  . Smokeless tobacco: Never Used  . Alcohol Use: No  . Drug Use: No  . Sexual Activity: Not on file   Other Topics Concern  . Not on file   Social History Narrative   Patient lives at home with her daughter.   Caffeine Use: drinks regurlarly     PHYSICAL EXAM  Filed Vitals:   08/06/14 1507  BP: 126/88  Pulse: 92  Height: _0  (1.626 m)  Weight: 258 lb 3.2 oz (117.119 kg)    Not recorded      Body mass index is 44.3 kg/(m^2).  GENERAL EXAM: General: Patient is awake. No acute distress.  Well developed and groomed. HEAD AND VOICE TREMOR Neck: Neck is supple. Cardiovascular: No carotid artery bruits.  Heart is regular rate and rhythm with no murmurs.  Neurologic Exam  Mental Status: Awake, alert. Language is fluent and comprehension intact. Cranial Nerves: Pupils are equal and reactive to light.  Visual fields are full to confrontation.  Conjugate  eye movements are full and symmetric.  Facial sensation and strength are symmetric.  Hearing is intact.  Palate elevated symmetrically and uvula is midline.  Shoulder shrug is symmetric.  Tongue is midline. Motor: NO POSTURAL OR REST TREMOR. Normal bulk and tone.  Full strength in the upper and lower extremities.  No pronator drift. Sensory: Intact and symmetric to light touch. Coordination: MILD ACTION TREMOR (L > R; ESP WITH HAND WRITING). No ataxia or dysmetria on finger-nose or rapid alternating movement testing. Gait and Station: CAUTIOUS GAIT. Romberg is negative. Reflexes: Deep tendon reflexes in the upper and lower extremity are TRACE and symmetric; ABSENT AT ANKLES.    DIAGNOSTIC DATA (LABS, IMAGING, TESTING) -  I reviewed patient records, labs, notes, testing and imaging myself where available.  No results found for: WBC No results found for: NA No results found for: CHOL No results found for: HGBA1C No results found for: VITAMINB12 No results found for: TSH  SED RATE  Date Value Ref Range Status  09/26/2012 27 0 - 40 mm/hr Final   CRP  Date Value Ref Range Status  09/26/2012 2.3 0.0 - 4.9 mg/L Final    11/02/12 MRI brain - normal    ASSESSMENT AND PLAN  59 y.o. female with progressive postural and action tremor. Tremor has improved somewhat since stoping depakote and reducing gabapentin. She likely has underlying essential tremor as well.  Dx tremor: essential tremor (worsening)  Dx headache: migraine vs tension HA (resolved)  PLAN: I spent 25 minutes of face to face time with patient. Greater than 50% of time was spent in counseling and coordination of care with patient. In summary we discussed:  1. Continue primidone to 169m BID 2. Increase topiramate to 1028mBID for tremor and migraine control 3. I think patient would benefit from home health aid and home health agency evaluation; she will discuss with PCP 4. In [facility], [facility] for essential tremor  Return in about 1 year (around 08/06/2015).     VIPenni BombardMD 07/25/51/61444:3:15M Certified in Neurology, Neurophysiology and Neuroimaging  [facility] [address] [place] [phone]

## 2014-08-06 NOTE — Telephone Encounter (Signed)
error 

## 2014-09-01 ENCOUNTER — Other Ambulatory Visit: Payer: Self-pay | Admitting: Family Medicine

## 2014-09-01 ENCOUNTER — Ambulatory Visit
Admission: RE | Admit: 2014-09-01 | Discharge: 2014-09-01 | Disposition: A | Discharge status: Home / Self Care | Payer: Medicare Other | Source: Ambulatory Visit | Attending: Family Medicine | Admitting: Family Medicine

## 2014-09-01 DIAGNOSIS — J069 Acute upper respiratory infection, unspecified: Secondary | ICD-10-CM

## 2014-10-13 ENCOUNTER — Other Ambulatory Visit: Payer: Self-pay | Admitting: Diagnostic Neuroimaging

## 2015-02-09 DIAGNOSIS — Z794 Long term (current) use of insulin: Secondary | ICD-10-CM

## 2015-02-09 DIAGNOSIS — E119 Type 2 diabetes mellitus without complications: Secondary | ICD-10-CM | POA: Insufficient documentation

## 2015-04-27 ENCOUNTER — Telehealth: Payer: Self-pay | Admitting: *Deleted

## 2015-04-27 MED ORDER — PRIMIDONE 50 MG PO TABS: ORAL_TABLET | ORAL | Status: DC

## 2015-04-27 NOTE — Telephone Encounter (Signed)
Spoke with patient and informed her she has refills at [facility] [address] [address] through [facility]. She stated she is changing to [facility] to get 3 mos at a time. She requested this refill be sent there. She stated she would not call [facility] for further refills there. Informed her would refill to get her through her FU in Aug 2017. She verbalized understanding, appreciation.  [facility] for [facility] [address] [address] informing pharmacist pt is now getting primidone thru [facility] and refill has been sent there at her request. Left this caller's name, number for questions.

## 2015-06-16 ENCOUNTER — Other Ambulatory Visit: Payer: Self-pay | Admitting: Diagnostic Neuroimaging

## 2015-08-10 ENCOUNTER — Encounter: Payer: Self-pay | Admitting: Diagnostic Neuroimaging

## 2015-08-10 ENCOUNTER — Ambulatory Visit (INDEPENDENT_AMBULATORY_CARE_PROVIDER_SITE_OTHER): Payer: BLUE CROSS/BLUE SHIELD | Admitting: Diagnostic Neuroimaging

## 2015-08-10 VITALS — BP 126/76 | HR 99 | Ht 64.0 in | Wt 251.0 lb

## 2015-08-10 DIAGNOSIS — G250 Essential tremor: Secondary | ICD-10-CM

## 2015-08-10 MED ORDER — PRIMIDONE 50 MG PO TABS: 50.0000 mg | ORAL_TABLET | Freq: Two times a day (BID) | ORAL | Status: DC

## 2015-08-10 NOTE — Progress Notes (Signed)
[facility]  PATIENT: Dominique Russell DOB: [birth year]  REFERRING CLINICIAN:  HISTORY FROM: patient and grand-daughter REASON FOR VISIT: follow up    HISTORICAL  CHIEF COMPLAINT:  Chief Complaint  Patient presents with  . Essential tremor    rm 4, grand daughter- [place], "tremors are no better, seems like it gets worse sometimes; stopped Topiramate because it messed with my thought process, cut back on Primidone for the same reason""  . Follow-up    1 year    HISTORY OF PRESENT ILLNESS:   UPDATE 08/10/15: Since last visit, now having more memory loss problems. She thought that it was being caused by TPX and primidone, so therefore she self-discontinued TPX and reduced primidone without letting me know.   UPDATE 08/06/14: Since last visit, tremor stable to slightly worse. HA much better. Having diff with ADLs, transportation.   UPDATE 12/101/15: Since last visit, doing about the same. Tremor stable, but not better since increasing primidone last year. Some sleepiness with primidone.  UPDATE 01/31/13: Patient's headache has resolved. MRI of the brain comment ESR and CRP were unremarkable. Patient's tremor has continued. She has particular difficulty holding up today, holding a cup of coffee a spoon or fork. Patient is taking primidone 50 mg morning 100 mg a day. Patient also complaining of acid reflux symptoms.  UPDATE 09/26/12: Tremor slightly progressing, esp with eating food using a fork. Also with new onset severe left sided headache (09/19/12) and went to [place] for evaluation. CT head unremarkable. Headache is not present today, but is intermittent. Using aspirin and tylenol at home. No N/V/photo/phonophobia.  No similar prior HA.  UPDATE 03/14/12: Doing well, tremor much better with primidone. Taking 62m BID. No side effects. Satisfied with current dosing.  UPDATE 10/31/11: Doing better, now that depakote has been stopped. Also on lower  gabapentin. Still with action tremor and anxiety. Still with insomnia.   PRIOR HPI: 60year old right-handed female with history of hypertension, diabetes, fibromyalgia, depression, anxiety, rheumatoid arthritis, here for evaluation of tremor. In August 2012, patient was visiting out of town, forgetting to take her gabapentin with her and ran out of this medicine 3 days. She thinks that this led to her developing bilateral hand tremor. When she came back home and resume gabapentin her tremor continued. She notices tremor when she drinks from a glass, uses utensils or right with a pen. Tremor has been progressively getting worse over time. She thinks worse when she is more anxious.   REVIEW OF SYSTEMS: Full 14 system review of systems performed and negative except as per HPI.   ALLERGIES: No Known Allergies  HOME MEDICATIONS: Outpatient Prescriptions Prior to Visit  Medication Sig Dispense Refill  . atorvastatin (LIPITOR) 20 MG tablet Take 20 mg by mouth daily. 1/2 tab qd    . BUPROPION HCL ER, XL, PO Take 300 mg by mouth daily.     . Cholecalciferol (VITAMIN D-3) 1000 UNITS CAPS Take by mouth.    . citalopram (CELEXA) 20 MG tablet Take 20 mg by mouth daily.    . diclofenac sodium (VOLTAREN) 1 % GEL Place onto the skin.    .Marland KitchendiphenhydrAMINE (BENADRYL) 25 mg capsule Take by mouth.    . folic acid (FOLVITE) 8704MCG tablet Take 400 mcg by mouth daily.    . Glucosamine-Chondroit-Vit C-Mn (GLUCOSAMINE 1500 COMPLEX) CAPS Take by mouth.    .Marland KitchenHUMULIN 70/30 KWIKPEN (70-30) 100 UNIT/ML PEN     . hydrochlorothiazide (HYDRODIURIL)  25 MG tablet Take 25 mg by mouth daily.    Marland Kitchen losartan (COZAAR) 100 MG tablet Take 100 mg by mouth daily.    Marland Kitchen LYRICA 150 MG capsule Take 1 tablet by mouth 3 (three) times daily.    . mirtazapine (REMERON) 15 MG tablet Take 15 mg by mouth at bedtime.    . Omega-3 Fatty Acids (FISH OIL PEARLS) 300 MG CAPS Take 1 tablet by mouth 2 (two) times daily.    Marland Kitchen omeprazole (PRILOSEC)  40 MG capsule Take 1 capsule by mouth daily.    . pioglitazone-metformin (ACTOPLUS MET) 15-850 MG per tablet Take 1 tablet by mouth 2 (two) times daily with a meal.    . potassium gluconate 595 MG TABS tablet Take 595 mg by mouth.    . primidone (MYSOLINE) 50 MG tablet Take 2 tablets by mouth two times daily 360 tablet 0  . topiramate (TOPAMAX) 100 MG tablet Take 1 tablet (100 mg total) by mouth 2 (two) times daily. 60 tablet 12  . vitamin E 400 UNIT capsule Take 400 Units by mouth daily.    Marland Kitchen ALPRAZolam (XANAX) 0.5 MG tablet Reported on 08/10/2015    . cyclobenzaprine (FLEXERIL) 5 MG tablet Take 5 mg by mouth at bedtime as needed for muscle spasms. Reported on 08/10/2015    . DULoxetine (CYMBALTA) 60 MG capsule Take 60 mg by mouth.     No facility-administered medications prior to visit.    PAST MEDICAL HISTORY: Past Medical History  Diagnosis Date  . Hypertension   . Diabetes ([place])   . Fibromyalgia   . Essential tremor     PAST SURGICAL HISTORY: No past surgical history on file.  FAMILY HISTORY: Family History  Problem Relation Age of Onset  . Tremor Neg Hx   . Depression Mother   . Hypertension Mother   . Kidney failure Brother     SOCIAL HISTORY:  Social History   Social History  . Marital Status: Legally Separated    Spouse Name: N/A  . Number of Children: 2  . Years of Education: HS   Occupational History  . Not on file.   Social History Main Topics  . Smoking status: Former Smoker    Quit date: 02/22/1995  . Smokeless tobacco: Never Used  . Alcohol Use: No  . Drug Use: No  . Sexual Activity: Not on file   Other Topics Concern  . Not on file   Social History Narrative   Patient lives at home with her daughter.   Caffeine Use: drinks regurlarly     PHYSICAL EXAM  Filed Vitals:   08/10/15 1452  BP: 126/76  Pulse: 99  Height: 5' 4"  (1.626 m)  Weight: 251 lb (113.853 kg)    Not recorded      Body mass index is 43.06 kg/(m^2).  GENERAL  EXAM: General: Patient is awake. No acute distress.  Well developed and groomed. MILD HEAD AND VOICE TREMOR Neck: Neck is supple. Cardiovascular: No carotid artery bruits.  Heart is regular rate and rhythm with no murmurs.  Neurologic Exam  Mental Status: Awake, alert. Language is fluent and comprehension intact. Cranial Nerves: Pupils are equal and reactive to light.  Visual fields are full to confrontation.  Conjugate eye movements are full and symmetric.  Facial sensation and strength are symmetric.  Hearing is intact.  Palate elevated symmetrically and uvula is midline.  Shoulder shrug is symmetric.  Tongue is midline. Motor: NO POSTURAL OR REST TREMOR. ABLE TO  DRINK WATER FROM CUP WITHOUT SPILLING. Normal bulk and tone.  Full strength in the upper and lower extremities.  No pronator drift. Sensory: Intact and symmetric to light touch. Coordination: MILD ACTION TREMOR (L > R; ESP WITH HAND WRITING). No ataxia or dysmetria on finger-nose or rapid alternating movement testing. Gait and Station: CAUTIOUS GAIT. Romberg is negative. Reflexes: Deep tendon reflexes in the upper and lower extremity are TRACE and symmetric; ABSENT AT ANKLES.    DIAGNOSTIC DATA (LABS, IMAGING, TESTING) - I reviewed patient records, labs, notes, testing and imaging myself where available.  No results found for: WBC No results found for: NA No results found for: CHOL No results found for: HGBA1C No results found for: VITAMINB12 No results found for: TSH  SED RATE  Date Value Ref Range Status  09/26/2012 27 0 - 40 mm/hr Final   CRP  Date Value Ref Range Status  09/26/2012 2.3 0.0 - 4.9 mg/L Final    11/02/12 MRI brain - normal    ASSESSMENT AND PLAN  60 y.o. female with progressive postural and action tremor. Tremor has improved somewhat since stoping depakote and reducing gabapentin. She likely has underlying essential tremor as well. Also with mild memory loss issues, likely related to underlying  insomnia, fibromyalgia, depression issues.   Dx: essential tremor (stable)  Essential tremor     PLAN: I spent 15 minutes of face to face time with patient. Greater than 50% of time was spent in counseling and coordination of care with patient. In summary we discussed:  - continue primidone to 90m BID - encouraged patient to follow up with psychiatry  Meds ordered this encounter  Medications  . primidone (MYSOLINE) 50 MG tablet    Sig: Take 1 tablet (50 mg total) by mouth 2 (two) times daily.    Dispense:  180 tablet    Refill:  4   Return in about 1 year (around 08/09/2016).     VPenni Bombard MD 63/58/2518 39:84PM Certified in Neurology, Neurophysiology and Neuroimaging  [facility] [address] SCustarGJulian [place] [phone]

## 2015-08-10 NOTE — Patient Instructions (Signed)
-   stop topiramate  - continue primidone 50mg  twice a day

## 2016-03-09 ENCOUNTER — Ambulatory Visit: Payer: Medicare Other | Admitting: Osteopathic Medicine

## 2016-03-16 ENCOUNTER — Ambulatory Visit (INDEPENDENT_AMBULATORY_CARE_PROVIDER_SITE_OTHER): Payer: BLUE CROSS/BLUE SHIELD | Admitting: Osteopathic Medicine

## 2016-03-16 ENCOUNTER — Encounter: Payer: Self-pay | Admitting: Osteopathic Medicine

## 2016-03-16 VITALS — BP 135/85 | HR 82 | Ht 62.0 in | Wt 249.0 lb

## 2016-03-16 DIAGNOSIS — L409 Psoriasis, unspecified: Secondary | ICD-10-CM

## 2016-03-16 DIAGNOSIS — G250 Essential tremor: Secondary | ICD-10-CM | POA: Diagnosis not present

## 2016-03-16 DIAGNOSIS — M797 Fibromyalgia: Secondary | ICD-10-CM | POA: Diagnosis not present

## 2016-03-16 DIAGNOSIS — Z794 Long term (current) use of insulin: Secondary | ICD-10-CM

## 2016-03-16 DIAGNOSIS — M069 Rheumatoid arthritis, unspecified: Secondary | ICD-10-CM

## 2016-03-16 DIAGNOSIS — E118 Type 2 diabetes mellitus with unspecified complications: Secondary | ICD-10-CM | POA: Diagnosis not present

## 2016-03-16 DIAGNOSIS — L309 Dermatitis, unspecified: Secondary | ICD-10-CM

## 2016-03-16 DIAGNOSIS — I10 Essential (primary) hypertension: Secondary | ICD-10-CM

## 2016-03-16 MED ORDER — CLOBETASOL PROPIONATE 0.05 % EX OINT: 1.0000 "application " | TOPICAL_OINTMENT | Freq: Two times a day (BID) | CUTANEOUS | 1 refills | Status: AC

## 2016-03-16 NOTE — Progress Notes (Signed)
HPI: Dominique Russell is a 61 y.o. female  who presents to [facility] today, 03/16/16,  for chief complaint of:  Chief Complaint  Patient presents with  . Establish Care     CARDIOVASCULAR - HTN, on Losartan and HCT, is also on potasium. Also on atorvastatin.   RESPIRATORY - Flonase for seasonal allergies, stable. No Hc  NEUROLOGICAL - seeing Neuro for tremor, thinks may have been due to missed doses of Gabapentin awhile ago. Primidone currently. Also on Lyrica.   ENDOCRINE - seeing Endocrinologist for DM2 - we will not be checking A1C or managing meds.   MUSCULOSKELETAL/RHEUM - seeing pain management for RA and FM. They are managing Lyrica, Mobic, Duloxetine, Voltaren gel. Also taking Tylenol OTC. Stable problem at this time.   SKIN - new problem:  patch on L elbow, itching, few months, no patches anywhere else. Has tried Vaseline on it. No Rx. Other skin problem: very dry and cracking skin on palms/fingers. Same treatment has not helped.     Past medical, surgical, social and family history reviewed: Patient Active Problem List   Diagnosis Date Noted  . Essential tremor 09/26/2012  . Headache 09/26/2012   History reviewed. No pertinent surgical history. Social History  Substance Use Topics  . Smoking status: Former Smoker    Quit date: 02/22/1995  . Smokeless tobacco: Never Used  . Alcohol use No   Family History  Problem Relation Age of Onset  . Depression Mother   . Hypertension Mother   . Kidney failure Brother   . Tremor Neg Hx      Current medication list and allergy/intolerance information reviewed:   Current Outpatient Prescriptions  Medication Sig Dispense Refill  . acetaminophen (TYLENOL) 650 MG CR tablet Take 650 mg by mouth every 8 (eight) hours as needed for pain.    Marland Kitchen atorvastatin (LIPITOR) 20 MG tablet Take 20 mg by mouth daily. 1/2 tab qd    . Cholecalciferol (VITAMIN D-3) 1000 UNITS CAPS Take by mouth.    .  diclofenac sodium (VOLTAREN) 1 % GEL Place onto the skin.    . fluticasone (FLONASE) 50 MCG/ACT nasal spray     . folic acid (FOLVITE) 161 MCG tablet Take 400 mcg by mouth daily.    Marland Kitchen HUMULIN 70/30 KWIKPEN (70-30) 100 UNIT/ML PEN     . hydrochlorothiazide (HYDRODIURIL) 25 MG tablet Take 25 mg by mouth daily.    Marland Kitchen liraglutide (VICTOZA) 18 MG/3ML SOPN Inject 1.8 mg into the skin.    Marland Kitchen losartan (COZAAR) 100 MG tablet Take 100 mg by mouth daily.    Marland Kitchen LYRICA 150 MG capsule Take 1 tablet by mouth 3 (three) times daily.    . metFORMIN (GLUCOPHAGE) 850 MG tablet Take 850 mg by mouth.    . Omega-3 Fatty Acids (FISH OIL PEARLS) 300 MG CAPS Take 1 tablet by mouth 2 (two) times daily.    . pioglitazone-metformin (ACTOPLUS MET) 15-850 MG per tablet Take 1 tablet by mouth 2 (two) times daily with a meal.    . potassium gluconate 595 MG TABS tablet Take 595 mg by mouth.    . primidone (MYSOLINE) 50 MG tablet Take 1 tablet (50 mg total) by mouth 2 (two) times daily. 180 tablet 4  . vitamin E 400 UNIT capsule Take 400 Units by mouth daily.    Marland Kitchen aspirin (GOODSENSE ASPIRIN) 325 MG tablet Take 325 mg by mouth.    . B-D UF III MINI PEN NEEDLES  31G X 5 MM MISC USE AS DIRECTED WITH INSULIN AND VICTOZA.  11  . Cholecalciferol (VITAMIN D-3) 1000 units CAPS Take by mouth.    . clobetasol ointment (TEMOVATE) 6.22 % Apply 1 application topically 2 (two) times daily. To affected area(s) as needed, max 2-3 weeks to avoid whitening/thinning skin 45 g 1  . DULoxetine (CYMBALTA) 60 MG capsule Take 60 mg by mouth.    . meloxicam (MOBIC) 15 MG tablet     . Omega-3 Fatty Acids (FISH OIL) 1000 MG CAPS Take by mouth.    . ONE TOUCH ULTRA TEST test strip     . vitamin B-12 (CYANOCOBALAMIN) 1000 MCG tablet Take by mouth.     No current facility-administered medications for this visit.    No Known Allergies    Review of Systems:  Constitutional:  No  fever, no chills, No recent illness, No unintentional weight changes. No  significant fatigue.   HEENT: No  headache, no vision change, no hearing change, No sore throat, No  sinus pressure  Cardiac: No  chest pain, No  pressure, No palpitations, No  Orthopnea  Respiratory:  No  shortness of breath. No  Cough  Gastrointestinal: No  abdominal pain, No  nausea, No  vomiting,  No  blood in stool, No  diarrhea, No  constipation   Musculoskeletal: No new myalgia/arthralgia  Genitourinary: No  incontinence, No  abnormal genital bleeding, No abnormal genital discharge  Skin: +Rash, No other wounds/concerning lesions  Hem/Onc: No  easy bruising/bleeding, No  abnormal lymph node  Endocrine: No cold intolerance,  No heat intolerance. No polyuria/polydipsia/polyphagia   Neurologic: No  weakness, No  dizziness, No  slurred speech/focal weakness/facial droop  Psychiatric: No  concerns with depression, No  concerns with anxiety, No sleep problems, No mood problems  Exam:  BP 135/85   Pulse 82   Ht 5' 2" (1.575 m)   Wt 249 lb (112.9 kg)   BMI 45.54 kg/m   Constitutional: VS see above. General Appearance: alert, well-developed, well-nourished, NAD  Eyes: Normal lids and conjunctive, non-icteric sclera  Ears, Nose, Mouth, Throat: MMM, Normal external inspection ears/nares/mouth/lips/gums. TM normal bilaterally. Pharynx/tonsils no erythema, no exudate. Nasal mucosa normal.   Neck: No masses, trachea midline. No thyroid enlargement. No tenderness/mass appreciated. No lymphadenopathy  Respiratory: Normal respiratory effort. no wheeze, no rhonchi, no rales  Cardiovascular: S1/S2 normal, no murmur, no rub/gallop auscultated. RRR. No lower extremity edema. Pedal pulse II/IV bilaterally DP and PT. No carotid bruit or JVD. No abdominal aortic bruit.  Musculoskeletal: Gait normal. No clubbing/cyanosis of digits.   Neurological: Normal balance/coordination. No tremor. No cranial nerve deficit on limited exam. Motor and sensation intact and symmetric. Cerebellar reflexes  intact.   Skin: warm, dry, intact. (+)dry cracking skin on palmar surfaces hands. Hyperpigmented scaling patch on L elbow, no erythema/drainage  Psychiatric: Normal judgment/insight. Normal mood and affect. Oriented x3.    Results for orders placed or performed in visit on 03/16/16 (from the past 72 hour(s))  CBC with Differential/Platelet     Status: Abnormal   Collection Time: 03/16/16  4:13 PM  Result Value Ref Range   WBC 4.8 3.8 - 10.8 K/uL   RBC 4.16 3.80 - 5.10 MIL/uL   Hemoglobin 11.4 (L) 11.7 - 15.5 g/dL   HCT 36.3 35.0 - 45.0 %   MCV 87.3 80.0 - 100.0 fL   MCH 27.4 27.0 - 33.0 pg   MCHC 31.4 (L) 32.0 - 36.0 g/dL   RDW  13.4 11.0 - 15.0 %   Platelets 210 140 - 400 K/uL   MPV 10.7 7.5 - 12.5 fL   Neutro Abs 1,680 1,500 - 7,800 cells/uL   Lymphs Abs 2,544 850 - 3,900 cells/uL   Monocytes Absolute 432 200 - 950 cells/uL   Eosinophils Absolute 144 15 - 500 cells/uL   Basophils Absolute 0 0 - 200 cells/uL   Neutrophils Relative % 35 %   Lymphocytes Relative 53 %   Monocytes Relative 9 %   Eosinophils Relative 3 %   Basophils Relative 0 %   Smear Review Criteria for review not met   COMPLETE METABOLIC PANEL WITH GFR     Status: None   Collection Time: 03/16/16  4:13 PM  Result Value Ref Range   Sodium 142 135 - 146 mmol/L   Potassium 4.2 3.5 - 5.3 mmol/L   Chloride 103 98 - 110 mmol/L   CO2 30 20 - 31 mmol/L   Glucose, Bld 65 65 - 99 mg/dL   BUN 22 7 - 25 mg/dL   Creat 0.89 0.50 - 0.99 mg/dL    Comment:   For patients > or = 63 years of age: The upper reference limit for Creatinine is approximately 13% higher for people identified as African-American.      Total Bilirubin 0.3 0.2 - 1.2 mg/dL   Alkaline Phosphatase 52 33 - 130 U/L   AST 30 10 - 35 U/L   ALT 24 6 - 29 U/L   Total Protein 6.4 6.1 - 8.1 g/dL   Albumin 3.8 3.6 - 5.1 g/dL   Calcium 8.9 8.6 - 10.4 mg/dL   GFR, Est African American 80 >=60 mL/min   GFR, Est Non African American 70 >=60 mL/min  Lipid  panel     Status: Abnormal   Collection Time: 03/16/16  4:13 PM  Result Value Ref Range   Cholesterol 142 <200 mg/dL   Triglycerides 121 <150 mg/dL   HDL 48 (L) >50 mg/dL   Total CHOL/HDL Ratio 3.0 <5.0 Ratio   VLDL 24 <30 mg/dL   LDL Cholesterol 70 <100 mg/dL  TSH     Status: None   Collection Time: 03/16/16  4:13 PM  Result Value Ref Range   TSH 0.52 mIU/L    Comment:   Reference Range   > or = 20 Years  0.40-4.50   Pregnancy Range First trimester  0.26-2.66 Second trimester 0.55-2.73 Third trimester  0.43-2.91       No results found.   ASSESSMENT/PLAN:   Psoriasis - Patch on alone were consistent with psoriasis, trial hypodensity steroids, consider decreased potency if improved. Biopsy if no better - Plan: clobetasol ointment (TEMOVATE) 0.05 %  Type 2 diabetes mellitus with complication, with long-term current use of insulin (HCC) - Following with endocrinology, we will not be monitoring A1c or doing refills - Plan: CBC with Differential/Platelet, COMPLETE METABOLIC PANEL WITH GFR, Lipid panel, TSH  Benign essential tremor - Following with neurology.  Fibromyalgia - Alling with pain management  Rheumatoid arthritis, involving unspecified site, unspecified rheumatoid factor presence ([place]) - Following with pain management  Essential hypertension - Controlled on current medication regimen.  Eczema of both hands - high potency steroid to try      Visit summary with medication list and pertinent instructions was printed for patient to review. All questions at time of visit were answered - patient instructed to contact office with any additional concerns. ER/RTC precautions were reviewed with the patient. Follow-up plan:  Return in about 6 months (around 09/13/2016) for [place], sooner if needed, 1 month if rash no better or not gone .

## 2016-03-17 DIAGNOSIS — L409 Psoriasis, unspecified: Secondary | ICD-10-CM | POA: Insufficient documentation

## 2016-03-17 DIAGNOSIS — I10 Essential (primary) hypertension: Secondary | ICD-10-CM | POA: Insufficient documentation

## 2016-03-17 LAB — TSH: TSH: 0.52 m[IU]/L

## 2016-03-17 LAB — COMPLETE METABOLIC PANEL WITH GFR
ALBUMIN: 3.8 g/dL (ref 3.6–5.1)
ALK PHOS: 52 U/L (ref 33–130)
ALT: 24 U/L (ref 6–29)
AST: 30 U/L (ref 10–35)
BUN: 22 mg/dL (ref 7–25)
CO2: 30 mmol/L (ref 20–31)
Calcium: 8.9 mg/dL (ref 8.6–10.4)
Chloride: 103 mmol/L (ref 98–110)
Creat: 0.89 mg/dL (ref 0.50–0.99)
GFR, Est African American: 80 mL/min (ref >=60)
GFR, Est Non African American: 70 mL/min (ref >=60)
GLUCOSE: 65 mg/dL (ref 65–99)
Potassium: 4.2 mmol/L (ref 3.5–5.3)
SODIUM: 142 mmol/L (ref 135–146)
Total Bilirubin: 0.3 mg/dL (ref 0.2–1.2)
Total Protein: 6.4 g/dL (ref 6.1–8.1)

## 2016-03-17 LAB — CBC WITH DIFFERENTIAL/PLATELET
Basophils Absolute: 0 cells/uL (ref 0–200)
Basophils Relative: 0 %
EOS PCT: 3 %
Eosinophils Absolute: 144 cells/uL (ref 15–500)
HCT: 36.3 % (ref 35.0–45.0)
HEMOGLOBIN: 11.4 g/dL — AB (ref 11.7–15.5)
LYMPHS ABS: 2544 {cells}/uL (ref 850–3900)
Lymphocytes Relative: 53 %
MCH: 27.4 pg (ref 27.0–33.0)
MCHC: 31.4 g/dL — AB (ref 32.0–36.0)
MCV: 87.3 fL (ref 80.0–100.0)
MPV: 10.7 fL (ref 7.5–12.5)
Monocytes Absolute: 432 cells/uL (ref 200–950)
Monocytes Relative: 9 %
NEUTROS ABS: 1680 {cells}/uL (ref 1500–7800)
Neutrophils Relative %: 35 %
Platelets: 210 10*3/uL (ref 140–400)
RBC: 4.16 MIL/uL (ref 3.80–5.10)
RDW: 13.4 % (ref 11.0–15.0)
WBC: 4.8 10*3/uL (ref 3.8–10.8)

## 2016-03-17 LAB — LIPID PANEL
CHOL/HDL RATIO: 3 ratio (ref <5.0)
Cholesterol: 142 mg/dL (ref <200)
HDL: 48 mg/dL — ABNORMAL LOW (ref >50)
LDL CALC: 70 mg/dL (ref <100)
Triglycerides: 121 mg/dL (ref <150)
VLDL: 24 mg/dL (ref <30)

## 2016-03-28 ENCOUNTER — Telehealth: Payer: Self-pay

## 2016-03-28 MED ORDER — LOSARTAN POTASSIUM 100 MG PO TABS: 100.0000 mg | ORAL_TABLET | Freq: Every day | ORAL | 0 refills | Status: DC

## 2016-03-28 MED ORDER — HYDROCHLOROTHIAZIDE 25 MG PO TABS: 25.0000 mg | ORAL_TABLET | Freq: Every day | ORAL | 0 refills | Status: DC

## 2016-03-28 NOTE — Telephone Encounter (Signed)
Patient request refill for blood pressure medication.

## 2016-03-31 ENCOUNTER — Telehealth: Payer: Self-pay | Admitting: Osteopathic Medicine

## 2016-03-31 MED ORDER — ATORVASTATIN CALCIUM 20 MG PO TABS: 10.0000 mg | ORAL_TABLET | Freq: Every day | ORAL | 3 refills | Status: DC

## 2016-03-31 MED ORDER — HYDROCHLOROTHIAZIDE 25 MG PO TABS: 25.0000 mg | ORAL_TABLET | Freq: Every day | ORAL | 1 refills | Status: DC

## 2016-03-31 MED ORDER — LOSARTAN POTASSIUM 100 MG PO TABS: 100.0000 mg | ORAL_TABLET | Freq: Every day | ORAL | 1 refills | Status: DC

## 2016-03-31 NOTE — Telephone Encounter (Signed)
Needed refills, sent

## 2016-04-05 DIAGNOSIS — G894 Chronic pain syndrome: Secondary | ICD-10-CM | POA: Diagnosis not present

## 2016-04-05 DIAGNOSIS — M797 Fibromyalgia: Secondary | ICD-10-CM | POA: Diagnosis not present

## 2016-04-05 DIAGNOSIS — M170 Bilateral primary osteoarthritis of knee: Secondary | ICD-10-CM | POA: Diagnosis not present

## 2016-04-14 ENCOUNTER — Other Ambulatory Visit: Payer: Self-pay | Admitting: *Deleted

## 2016-04-14 MED ORDER — PRIMIDONE 50 MG PO TABS: 50.0000 mg | ORAL_TABLET | Freq: Two times a day (BID) | ORAL | 4 refills | Status: DC

## 2016-04-20 DIAGNOSIS — Z2821 Immunization not carried out because of patient refusal: Secondary | ICD-10-CM | POA: Diagnosis not present

## 2016-04-20 DIAGNOSIS — I10 Essential (primary) hypertension: Secondary | ICD-10-CM | POA: Diagnosis not present

## 2016-04-20 DIAGNOSIS — Z794 Long term (current) use of insulin: Secondary | ICD-10-CM | POA: Diagnosis not present

## 2016-04-20 DIAGNOSIS — E1142 Type 2 diabetes mellitus with diabetic polyneuropathy: Secondary | ICD-10-CM | POA: Diagnosis not present

## 2016-06-28 DIAGNOSIS — G894 Chronic pain syndrome: Secondary | ICD-10-CM | POA: Diagnosis not present

## 2016-06-28 DIAGNOSIS — M170 Bilateral primary osteoarthritis of knee: Secondary | ICD-10-CM | POA: Diagnosis not present

## 2016-06-28 DIAGNOSIS — M545 Low back pain: Secondary | ICD-10-CM | POA: Diagnosis not present

## 2016-07-08 ENCOUNTER — Encounter: Payer: Self-pay | Admitting: Osteopathic Medicine

## 2016-07-08 ENCOUNTER — Ambulatory Visit (INDEPENDENT_AMBULATORY_CARE_PROVIDER_SITE_OTHER): Payer: BLUE CROSS/BLUE SHIELD | Admitting: Osteopathic Medicine

## 2016-07-08 VITALS — BP 94/66 | HR 93 | Ht 64.0 in | Wt 246.0 lb

## 2016-07-08 DIAGNOSIS — I959 Hypotension, unspecified: Secondary | ICD-10-CM

## 2016-07-08 DIAGNOSIS — K219 Gastro-esophageal reflux disease without esophagitis: Secondary | ICD-10-CM

## 2016-07-08 DIAGNOSIS — R1013 Epigastric pain: Secondary | ICD-10-CM

## 2016-07-08 DIAGNOSIS — D72829 Elevated white blood cell count, unspecified: Secondary | ICD-10-CM | POA: Diagnosis not present

## 2016-07-08 DIAGNOSIS — I10 Essential (primary) hypertension: Secondary | ICD-10-CM

## 2016-07-08 DIAGNOSIS — R300 Dysuria: Secondary | ICD-10-CM | POA: Diagnosis not present

## 2016-07-08 DIAGNOSIS — Z79899 Other long term (current) drug therapy: Secondary | ICD-10-CM | POA: Diagnosis not present

## 2016-07-08 LAB — POCT URINALYSIS DIPSTICK
BILIRUBIN UA: NEGATIVE
Blood, UA: NEGATIVE
Glucose, UA: NEGATIVE
KETONES UA: NEGATIVE
Nitrite, UA: NEGATIVE
PH UA: 8 (ref 5.0–8.0)
Protein, UA: NEGATIVE
Spec Grav, UA: 1.015 (ref 1.010–1.025)
Urobilinogen, UA: 0.2 E.U./dL

## 2016-07-08 LAB — COMPLETE METABOLIC PANEL WITH GFR
ALK PHOS: 52 U/L (ref 33–130)
ALT: 34 U/L — ABNORMAL HIGH (ref 6–29)
AST: 35 U/L (ref 10–35)
Albumin: 4.1 g/dL (ref 3.6–5.1)
BUN: 19 mg/dL (ref 7–25)
CO2: 23 mmol/L (ref 20–31)
Calcium: 9.4 mg/dL (ref 8.6–10.4)
Chloride: 102 mmol/L (ref 98–110)
Creat: 1.08 mg/dL — ABNORMAL HIGH (ref 0.50–0.99)
GFR, EST AFRICAN AMERICAN: 64 mL/min (ref >=60)
GFR, Est Non African American: 55 mL/min — ABNORMAL LOW (ref >=60)
Glucose, Bld: 113 mg/dL — ABNORMAL HIGH (ref 65–99)
Potassium: 4.4 mmol/L (ref 3.5–5.3)
Sodium: 141 mmol/L (ref 135–146)
Total Bilirubin: 0.3 mg/dL (ref 0.2–1.2)
Total Protein: 7 g/dL (ref 6.1–8.1)

## 2016-07-08 LAB — CBC
HCT: 37.6 % (ref 35.0–45.0)
Hemoglobin: 12.1 g/dL (ref 11.7–15.5)
MCH: 28.5 pg (ref 27.0–33.0)
MCHC: 32.2 g/dL (ref 32.0–36.0)
MCV: 88.5 fL (ref 80.0–100.0)
MPV: 10 fL (ref 7.5–12.5)
PLATELETS: 210 10*3/uL (ref 140–400)
RBC: 4.25 MIL/uL (ref 3.80–5.10)
RDW: 14.2 % (ref 11.0–15.0)
WBC: 5.1 10*3/uL (ref 3.8–10.8)

## 2016-07-08 MED ORDER — OMEPRAZOLE 20 MG PO CPDR: 20.0000 mg | DELAYED_RELEASE_CAPSULE | Freq: Every day | ORAL | 0 refills | Status: DC

## 2016-07-08 NOTE — Patient Instructions (Signed)
Food Choices for Gastroesophageal Reflux Disease, Adult When you have gastroesophageal reflux disease (GERD), the foods you eat and your eating habits are very important. Choosing the right foods can help ease your discomfort. What guidelines do I need to follow?  Choose fruits, vegetables, whole grains, and low-fat dairy products.  Choose low-fat meat, fish, and poultry.  Limit fats such as oils, salad dressings, butter, nuts, and avocado.  Keep a food diary. This helps you identify foods that cause symptoms.  Avoid foods that cause symptoms. These may be different for everyone.  Eat small meals often instead of 3 large meals a day.  Eat your meals slowly, in a place where you are relaxed.  Limit fried foods.  Cook foods using methods other than frying.  Avoid drinking alcohol.  Avoid drinking large amounts of liquids with your meals.  Avoid bending over or lying down until 2-3 hours after eating. What foods are not recommended? These are some foods and drinks that may make your symptoms worse: Vegetables  Tomatoes. Tomato juice. Tomato and spaghetti sauce. Chili peppers. Onion and garlic. Horseradish. Fruits  Oranges, grapefruit, and lemon (fruit and juice). Meats  High-fat meats, fish, and poultry. This includes hot dogs, ribs, ham, sausage, salami, and bacon. Dairy  Whole milk and chocolate milk. Sour cream. Cream. Butter. Ice cream. Cream cheese. Drinks  Coffee and tea. Bubbly (carbonated) drinks or energy drinks. Condiments  Hot sauce. Barbecue sauce. Sweets/Desserts  Chocolate and cocoa. Donuts. Peppermint and spearmint. Fats and Oils  High-fat foods. This includes French fries and potato chips. Other  Vinegar. Strong spices. This includes black pepper, white pepper, red pepper, cayenne, curry powder, cloves, ginger, and chili powder. The items listed above may not be a complete list of foods and drinks to avoid. Contact your dietitian for more information.    This information is not intended to replace advice given to you by your health care provider. Make sure you discuss any questions you have with your health care provider. Document Released: 08/09/2011 Document Revised: 07/16/2015 Document Reviewed: 12/12/2012 Elsevier Interactive Patient Education  2017 [employer]  

## 2016-07-08 NOTE — Progress Notes (Signed)
HPI: Dominique Russell is a 61 y.o. female  who presents to [facility] today, 07/08/16,  for chief complaint of:  Chief Complaint  Patient presents with  . Abdominal Pain    Abd pain . Context: Hx acid reflux, no meds, hasn't bothered her in some time, hx gastric bypass . Location: upper abdomen/epigastric . Quality: burning . Duration: started few weeks ago after alcohol consumption on vacation . Timing: burning pain with carbonated drinks, heavy foods . Modifying factors: [place], aspirin, alka-seltzer  . Assoc signs/symptoms: no nausea/vomiting, no diarrhea/constipation/blood in stool, no chest pain/SOB  HTN: meds as below. BP low today, no dizziness/weakness, eating/drinking normally, no CP/SOB  Patient is accompanied by family member who assists with history-taking.   Past medical history, surgical history, social history and family history reviewed.  Patient Active Problem List   Diagnosis Date Noted  . Essential hypertension 03/17/2016  . Psoriasis 03/17/2016  . Long-term insulin use in type 2 diabetes ([place]) 02/09/2015  . Morbid obesity with BMI of 45.0-49.9, adult ([place]) 05/16/2014  . DM (diabetes mellitus) ([place]) 01/23/2013  . Low back pain 01/23/2013  . Shoulder pain 01/23/2013  . Bilateral knee pain 12/26/2012  . Bilateral shoulder pain 12/26/2012  . Essential tremor 09/26/2012  . Headache 09/26/2012  . Benign essential tremor 09/26/2012  . Fibromyalgia 03/02/2011  . Generalized osteoarthritis of multiple sites 03/02/2011  . Rheumatoid arthritis ([place]) 03/02/2011    Current medication list and allergy/intolerance information reviewed.   Current Outpatient Prescriptions on File Prior to Visit  Medication Sig Dispense Refill  . acetaminophen (TYLENOL) 650 MG CR tablet Take 650 mg by mouth every 8 (eight) hours as needed for pain.    Marland Kitchen aspirin (GOODSENSE ASPIRIN) 325 MG tablet Take 325 mg by mouth.    Marland Kitchen atorvastatin (LIPITOR) 20  MG tablet Take 0.5 tablets (10 mg total) by mouth daily. 45 tablet 3  . B-D UF III MINI PEN NEEDLES 31G X 5 MM MISC USE AS DIRECTED WITH INSULIN AND VICTOZA.  11  . Cholecalciferol (VITAMIN D-3) 1000 units CAPS Take by mouth.    . clobetasol ointment (TEMOVATE) 8.75 % Apply 1 application topically 2 (two) times daily. To affected area(s) as needed, max 2-3 weeks to avoid whitening/thinning skin 45 g 1  . diclofenac sodium (VOLTAREN) 1 % GEL Place onto the skin.    . DULoxetine (CYMBALTA) 60 MG capsule Take 60 mg by mouth.    . fluticasone (FLONASE) 50 MCG/ACT nasal spray     . folic acid (FOLVITE) 643 MCG tablet Take 400 mcg by mouth daily.    Marland Kitchen HUMULIN 70/30 KWIKPEN (70-30) 100 UNIT/ML PEN     . hydrochlorothiazide (HYDRODIURIL) 25 MG tablet Take 1 tablet (25 mg total) by mouth daily. 90 tablet 1  . liraglutide (VICTOZA) 18 MG/3ML SOPN Inject 1.8 mg into the skin.    Marland Kitchen losartan (COZAAR) 100 MG tablet Take 1 tablet (100 mg total) by mouth daily. 90 tablet 1  . LYRICA 150 MG capsule Take 1 tablet by mouth 3 (three) times daily.    . meloxicam (MOBIC) 15 MG tablet     . metFORMIN (GLUCOPHAGE) 850 MG tablet Take 850 mg by mouth.    . Omega-3 Fatty Acids (FISH OIL PEARLS) 300 MG CAPS Take 1 tablet by mouth 2 (two) times daily.    . Omega-3 Fatty Acids (FISH OIL) 1000 MG CAPS Take by mouth.    . ONE TOUCH ULTRA TEST test strip     .  pioglitazone-metformin (ACTOPLUS MET) 15-850 MG per tablet Take 1 tablet by mouth 2 (two) times daily with a meal.    . potassium gluconate 595 MG TABS tablet Take 595 mg by mouth.    . primidone (MYSOLINE) 50 MG tablet Take 1 tablet (50 mg total) by mouth 2 (two) times daily. 180 tablet 4  . vitamin B-12 (CYANOCOBALAMIN) 1000 MCG tablet Take by mouth.    . vitamin E 400 UNIT capsule Take 400 Units by mouth daily.     No current facility-administered medications on file prior to visit.    No Known Allergies    Review of Systems:  Constitutional: No recent  illness  Cardiac: No  chest pain, No  pressure,   Respiratory:  No  shortness of breath.   Gastrointestinal: +abdominal pain sa per HPI, no change on bowel habits  Musculoskeletal: No new myalgia/arthralgia  Neurologic: No  weakness, No  Dizziness   Exam:  BP 114/72   Pulse 96   Ht 5' 4"  (1.626 m)   Wt 246 lb (111.6 kg)   BMI 42.23 kg/m   Constitutional: VS see above. General Appearance: alert, well-developed, well-nourished, NAD  Ears, Nose, Mouth, Throat: MMM, Normal external inspection ears/nares/mouth/lips/gums.  Neck: No masses, trachea midline.   Respiratory: Normal respiratory effort. no wheeze, no rhonchi, no rales  Cardiovascular: S1/S2 normal, no murmur, no rub/gallop auscultated. RRR.   Musculoskeletal: Gait normal. Symmetric and independent movement of all extremities  GI: nontender, obesity limits exam, normal BS x4  Neurological: Normal balance/coordination. No tremor.  Skin: warm, dry, intact.   Psychiatric: Normal judgment/insight. Normal mood and affect.    Recent Results (from the past 2160 hour(s))  POCT Urinalysis Dipstick     Status: Abnormal   Collection Time: 07/08/16  9:30 AM  Result Value Ref Range   Color, UA YELLOW    Clarity, UA CLEAR    Glucose, UA NEGATIVE    Bilirubin, UA NEGATIVE    Ketones, UA NEGATIVE    Spec Grav, UA 1.015 1.010 - 1.025   Blood, UA NEGATIVE    pH, UA 8.0 5.0 - 8.0   Protein, UA NEGATIVE    Urobilinogen, UA 0.2 0.2 or 1.0 E.U./dL   Nitrite, UA NEGATIVE    Leukocytes, UA Small (1+) (A) Negative    ASSESSMENT/PLAN:   8 weeks PPI & lifestyle modifications then reevaluate   Call if no better or if worse despite medications, or if any other changes/concerns  Gastroesophageal reflux disease, esophagitis presence not specified - Plan: H. pylori breath test  Epigastric pain - Plan: POCT Urinalysis Dipstick  Leukocytosis, unspecified type - Plan: Urine Culture  Essential hypertension - BP on the low side  but asymptomatic, recheck next visit   Hypotension, unspecified hypotension type - hold HCTZ, recheck BP next week, no dizziness/SOB, no weakness - Plan: CBC, COMPLETE METABOLIC PANEL WITH GFR, TSH      Follow-up plan: Return in about 3 days (around 07/11/2016) for nurse visit - recheck blood pressure, and in 2 months to recheck stomach issues w/ Dr.  Visit summary with medication list and pertinent instructions was printed for patient to review, alert [place] if any changes needed. All questions at time of visit were answered - patient instructed to contact office with any additional concerns. ER/RTC precautions were reviewed with the patient and understanding verbalized.

## 2016-07-09 LAB — URINE CULTURE

## 2016-07-09 LAB — TSH: TSH: 0.96 mIU/L

## 2016-07-11 ENCOUNTER — Ambulatory Visit (INDEPENDENT_AMBULATORY_CARE_PROVIDER_SITE_OTHER): Payer: BLUE CROSS/BLUE SHIELD | Admitting: Osteopathic Medicine

## 2016-07-11 VITALS — BP 124/74 | HR 76

## 2016-07-11 DIAGNOSIS — I959 Hypotension, unspecified: Secondary | ICD-10-CM | POA: Diagnosis not present

## 2016-07-11 DIAGNOSIS — I10 Essential (primary) hypertension: Secondary | ICD-10-CM | POA: Diagnosis not present

## 2016-07-11 LAB — H. PYLORI BREATH TEST: H. PYLORI BREATH TEST: NOT DETECTED

## 2016-07-11 NOTE — Progress Notes (Signed)
Patient came into clinic today for repeat BP check. At last OV, her BP was low. PCP discontinued HCTZ and advised Pt to continue taking only the Losartan 100mg . Pt's BP in office today was at goal. Will route to PCP for review. Pt advised I would contact her if there were going to be any changes. Verbalized understanding. No further questions.

## 2016-07-11 NOTE — Progress Notes (Signed)
BP 124/74   Pulse 76   BP ok, continue current medications, continue to hold HCTZ

## 2016-07-12 NOTE — Progress Notes (Signed)
Left VM advising Pt of PCP recommendation.

## 2016-07-20 DIAGNOSIS — K219 Gastro-esophageal reflux disease without esophagitis: Secondary | ICD-10-CM | POA: Diagnosis not present

## 2016-07-20 DIAGNOSIS — M797 Fibromyalgia: Secondary | ICD-10-CM | POA: Diagnosis not present

## 2016-07-20 DIAGNOSIS — I10 Essential (primary) hypertension: Secondary | ICD-10-CM | POA: Diagnosis not present

## 2016-07-20 DIAGNOSIS — M25561 Pain in right knee: Secondary | ICD-10-CM | POA: Diagnosis not present

## 2016-07-20 DIAGNOSIS — E785 Hyperlipidemia, unspecified: Secondary | ICD-10-CM | POA: Diagnosis not present

## 2016-07-20 DIAGNOSIS — E11618 Type 2 diabetes mellitus with other diabetic arthropathy: Secondary | ICD-10-CM | POA: Diagnosis not present

## 2016-07-20 DIAGNOSIS — Z87891 Personal history of nicotine dependence: Secondary | ICD-10-CM | POA: Diagnosis not present

## 2016-07-20 DIAGNOSIS — M1711 Unilateral primary osteoarthritis, right knee: Secondary | ICD-10-CM | POA: Diagnosis not present

## 2016-07-25 ENCOUNTER — Ambulatory Visit: Payer: Self-pay | Admitting: Diagnostic Neuroimaging

## 2016-07-26 ENCOUNTER — Encounter: Payer: Self-pay | Admitting: Diagnostic Neuroimaging

## 2016-08-03 DIAGNOSIS — M25561 Pain in right knee: Secondary | ICD-10-CM | POA: Diagnosis not present

## 2016-08-03 DIAGNOSIS — Z79899 Other long term (current) drug therapy: Secondary | ICD-10-CM | POA: Diagnosis not present

## 2016-08-03 DIAGNOSIS — Z87891 Personal history of nicotine dependence: Secondary | ICD-10-CM | POA: Diagnosis not present

## 2016-08-03 DIAGNOSIS — E11618 Type 2 diabetes mellitus with other diabetic arthropathy: Secondary | ICD-10-CM | POA: Diagnosis not present

## 2016-08-03 DIAGNOSIS — Z7982 Long term (current) use of aspirin: Secondary | ICD-10-CM | POA: Diagnosis not present

## 2016-08-03 DIAGNOSIS — E785 Hyperlipidemia, unspecified: Secondary | ICD-10-CM | POA: Diagnosis not present

## 2016-08-03 DIAGNOSIS — M170 Bilateral primary osteoarthritis of knee: Secondary | ICD-10-CM | POA: Diagnosis not present

## 2016-08-03 DIAGNOSIS — Z794 Long term (current) use of insulin: Secondary | ICD-10-CM | POA: Diagnosis not present

## 2016-08-03 DIAGNOSIS — G8929 Other chronic pain: Secondary | ICD-10-CM | POA: Diagnosis not present

## 2016-08-03 DIAGNOSIS — I10 Essential (primary) hypertension: Secondary | ICD-10-CM | POA: Diagnosis not present

## 2016-08-03 DIAGNOSIS — Z791 Long term (current) use of non-steroidal anti-inflammatories (NSAID): Secondary | ICD-10-CM | POA: Diagnosis not present

## 2016-08-03 DIAGNOSIS — M1712 Unilateral primary osteoarthritis, left knee: Secondary | ICD-10-CM | POA: Diagnosis not present

## 2016-08-03 DIAGNOSIS — M25562 Pain in left knee: Secondary | ICD-10-CM | POA: Diagnosis not present

## 2016-08-17 DIAGNOSIS — Z791 Long term (current) use of non-steroidal anti-inflammatories (NSAID): Secondary | ICD-10-CM | POA: Diagnosis not present

## 2016-08-17 DIAGNOSIS — E119 Type 2 diabetes mellitus without complications: Secondary | ICD-10-CM | POA: Diagnosis not present

## 2016-08-17 DIAGNOSIS — M797 Fibromyalgia: Secondary | ICD-10-CM | POA: Diagnosis not present

## 2016-08-17 DIAGNOSIS — Z87891 Personal history of nicotine dependence: Secondary | ICD-10-CM | POA: Diagnosis not present

## 2016-08-17 DIAGNOSIS — K219 Gastro-esophageal reflux disease without esophagitis: Secondary | ICD-10-CM | POA: Diagnosis not present

## 2016-08-17 DIAGNOSIS — E785 Hyperlipidemia, unspecified: Secondary | ICD-10-CM | POA: Diagnosis not present

## 2016-08-17 DIAGNOSIS — Z7982 Long term (current) use of aspirin: Secondary | ICD-10-CM | POA: Diagnosis not present

## 2016-08-17 DIAGNOSIS — Z794 Long term (current) use of insulin: Secondary | ICD-10-CM | POA: Diagnosis not present

## 2016-08-17 DIAGNOSIS — M79604 Pain in right leg: Secondary | ICD-10-CM | POA: Diagnosis not present

## 2016-08-17 DIAGNOSIS — S76311A Strain of muscle, fascia and tendon of the posterior muscle group at thigh level, right thigh, initial encounter: Secondary | ICD-10-CM | POA: Diagnosis not present

## 2016-08-17 DIAGNOSIS — I10 Essential (primary) hypertension: Secondary | ICD-10-CM | POA: Diagnosis not present

## 2016-08-17 DIAGNOSIS — Z79899 Other long term (current) drug therapy: Secondary | ICD-10-CM | POA: Diagnosis not present

## 2016-08-22 ENCOUNTER — Encounter: Payer: Self-pay | Admitting: Osteopathic Medicine

## 2016-08-22 ENCOUNTER — Ambulatory Visit (INDEPENDENT_AMBULATORY_CARE_PROVIDER_SITE_OTHER): Payer: BLUE CROSS/BLUE SHIELD | Admitting: Osteopathic Medicine

## 2016-08-22 VITALS — BP 128/65 | HR 107 | Wt 246.0 lb

## 2016-08-22 DIAGNOSIS — M797 Fibromyalgia: Secondary | ICD-10-CM

## 2016-08-22 DIAGNOSIS — S76301A Unspecified injury of muscle, fascia and tendon of the posterior muscle group at thigh level, right thigh, initial encounter: Secondary | ICD-10-CM

## 2016-08-22 MED ORDER — TIZANIDINE HCL 2 MG PO CAPS: 2.0000 mg | ORAL_CAPSULE | Freq: Three times a day (TID) | ORAL | 0 refills | Status: DC | PRN

## 2016-08-22 NOTE — Progress Notes (Signed)
HPI: Dominique Russell is a 61 y.o. female  who presents to [place] today, 08/22/16,  for chief complaint of:  Chief Complaint  Patient presents with  . Follow-up    Hospital follow up. Pulled muscle trying to get out of a large/tall truck last Thursday. Right thigh pain. She states the pain is an 8/10. It is affecting her sleep.      . Context: pulled muscles at back of leg getting out of a tall truck  . Location: hamstring on R  . Quality: severe soreness, cramping pain  . Duration: 5 days ago went to ER for RLE pain x6 days, today makes day 11 without much improvement . Modifying factors: Tizanidine Rx by ER hsa been helpful but she is running low, pt is seeing pain management for other issues. Hamstring was wrapped in ER but patient states this made things worse.    Pain Mgt records not able to be viewed at this time for some reason (?Care Everywhere for [facility]?)    ER records reviewed - no concerns on labs, [place] as below r/o DVT  US Venous Lower Extremity Right6/27/2018 [facility] Result Impression  IMPRESSION:No evidence of deep venous thrombosis.  Result Narrative  TECHNIQUE: The veins of right lower extremity were interrogated from the visible common femoral vein to the distal popliteal vein.The junction of the greater saphenous with the common femoral vein as well as the posterior tibial veins were evaluated. Gray scale, color, spectral, and doppler sonography was utilized. COMPARISON: None. INDICATION: Pain in Limb  FINDINGS: Flow is present in all interrogated vessels and there are no internal echoes.Normal venous compressibility is demonstrated and there is augmentation of flow with appropriate maneuvers.  INCIDENTAL FINDINGS:None      Past medical history, surgical history, social history and family history reviewed.  Patient Active Problem List   Diagnosis Date Noted  . Essential hypertension 03/17/2016    . Psoriasis 03/17/2016  . Long-term insulin use in type 2 diabetes ([place]) 02/09/2015  . Morbid obesity with BMI of 45.0-49.9, adult ([place]) 05/16/2014  . DM (diabetes mellitus) ([place]) 01/23/2013  . Low back pain 01/23/2013  . Shoulder pain 01/23/2013  . Bilateral knee pain 12/26/2012  . Bilateral shoulder pain 12/26/2012  . Essential tremor 09/26/2012  . Headache 09/26/2012  . Benign essential tremor 09/26/2012  . Fibromyalgia 03/02/2011  . Generalized osteoarthritis of multiple sites 03/02/2011  . Rheumatoid arthritis ([place]) 03/02/2011    Current medication list and allergy/intolerance information reviewed.   Current Outpatient Prescriptions on File Prior to Visit  Medication Sig Dispense Refill  . acetaminophen (TYLENOL) 650 MG CR tablet Take 650 mg by mouth every 8 (eight) hours as needed for pain.    Marland Kitchen atorvastatin (LIPITOR) 20 MG tablet Take 0.5 tablets (10 mg total) by mouth daily. 45 tablet 3  . B-D UF III MINI PEN NEEDLES 31G X 5 MM MISC USE AS DIRECTED WITH INSULIN AND VICTOZA.  11  . Cholecalciferol (VITAMIN D-3) 1000 units CAPS Take by mouth.    . clobetasol ointment (TEMOVATE) 1.19 % Apply 1 application topically 2 (two) times daily. To affected area(s) as needed, max 2-3 weeks to avoid whitening/thinning skin 45 g 1  . diclofenac sodium (VOLTAREN) 1 % GEL Place onto the skin.    . fluticasone (FLONASE) 50 MCG/ACT nasal spray     . folic acid (FOLVITE) 147 MCG tablet Take 400 mcg by mouth daily.    Marland Kitchen HUMULIN 70/30 KWIKPEN (  70-30) 100 UNIT/ML PEN     . liraglutide (VICTOZA) 18 MG/3ML SOPN Inject 1.8 mg into the skin.    Marland Kitchen losartan (COZAAR) 100 MG tablet Take 1 tablet (100 mg total) by mouth daily. 90 tablet 1  . LYRICA 150 MG capsule Take 1 tablet by mouth 3 (three) times daily.    . meloxicam (MOBIC) 15 MG tablet     . metFORMIN (GLUCOPHAGE) 850 MG tablet Take 850 mg by mouth.    . Omega-3 Fatty Acids (FISH OIL PEARLS) 300 MG CAPS Take 1 tablet by mouth 2 (two) times daily.     . Omega-3 Fatty Acids (FISH OIL) 1000 MG CAPS Take by mouth.    Marland Kitchen omeprazole (PRILOSEC) 20 MG capsule Take 1 capsule (20 mg total) by mouth daily. For 2 months 60 capsule 0  . ONE TOUCH ULTRA TEST test strip     . pioglitazone-metformin (ACTOPLUS MET) 15-850 MG per tablet Take 1 tablet by mouth 2 (two) times daily with a meal.    . potassium gluconate 595 MG TABS tablet Take 595 mg by mouth.    . primidone (MYSOLINE) 50 MG tablet Take 1 tablet (50 mg total) by mouth 2 (two) times daily. 180 tablet 4  . vitamin B-12 (CYANOCOBALAMIN) 1000 MCG tablet Take by mouth.    . vitamin E 400 UNIT capsule Take 400 Units by mouth daily.    Marland Kitchen aspirin (GOODSENSE ASPIRIN) 325 MG tablet Take 325 mg by mouth.    . DULoxetine (CYMBALTA) 60 MG capsule Take 60 mg by mouth.     No current facility-administered medications on file prior to visit.    No Known Allergies    Review of Systems:  Constitutional: No recent illness  HEENT: No  headache, no vision change  Cardiac: No  chest pain, No  pressure, No palpitations  Respiratory:  No  shortness of breath. No  Cough  Gastrointestinal: No  abdominal pain, no change on bowel habits  Musculoskeletal: +new myalgia/arthralgia  Skin: No  Rash  Neurologic: No weakness- hurts to move leg b/c of pain, No  Dizziness  Psychiatric: No  concerns with depression, No  concerns with anxiety  Exam:  BP 128/65   Pulse (!) 107   Wt 246 lb (111.6 kg)   SpO2 98%   BMI 42.23 kg/m   Constitutional: VS see above. General Appearance: alert, well-developed, well-nourished, NAD  Eyes: Normal lids and conjunctive, non-icteric sclera  Ears, Nose, Mouth, Throat: MMM, Normal external inspection ears/nares/mouth/lips/gums.  Neck: No masses, trachea midline.   Respiratory: Normal respiratory effort. no wheeze, no rhonchi, no rales  Cardiovascular: S1/S2 normal, no murmur, no rub/gallop auscultated. RRR.   Musculoskeletal: Gait antalgic favoring RLE. Symmetric and  independent movement of all extremities but notably favoring RLE, strength 5/5 all extremities, sensation intact legs bilaterally, no LBP/tenderness   Neurological: Normal balance/coordination. No tremor.  Skin: warm, dry, intact.   Psychiatric: Normal judgment/insight. Normal mood and affect. Oriented x3.       ASSESSMENT/PLAN:   Right hamstring injury, initial encounter - movement limitations d/t pain not weakness, no neuro concerning signs, get PT on board and consider early f/u w/ pain mgt  - Plan: Ambulatory referral to Physical Therapy  Fibromyalgia - comorbid - continue Lyrica    Patient Instructions  Plan:  Physical therapy  Continue current medications with addition of muscle relaxers  If worse/change, may need Xray or MRI  May be a good idea to talk to your pain management  doctor if this isn't better     Follow-up plan: Return in about 4 weeks (around 09/19/2016) for follow-up hamstring pain, sooner if needed .  Visit summary with medication list and pertinent instructions was printed for patient to review, alert [place] if any changes needed. All questions at time of visit were answered - patient instructed to contact office with any additional concerns. ER/RTC precautions were reviewed with the patient and understanding verbalized.

## 2016-08-22 NOTE — Patient Instructions (Addendum)
Plan:  Physical therapy  Continue current medications with addition of muscle relaxers  If worse/change, may need Xray or MRI  May be a good idea to talk to your pain management doctor if this isn't better     Hamstring Strain Rehab Ask your health care provider which exercises are safe for you. Do exercises exactly as told by your health care provider and adjust them as directed. It is normal to feel mild stretching, pulling, tightness, or discomfort as you do these exercises, but you should stop right away if you feel sudden pain or your pain gets worse.Do not begin these exercises until told by your health care provider. Strengthening exercises These exercises build strength and endurance in your thighs. Endurance is the ability to use your muscles for a long time, even after your muscles get tired. Exercise A: Straight leg raises ( hip extensors) 1. Lie on your belly on a firm surface. 2. Tense the muscles in your buttocks to lift your left / right leg about 4 inches (10 cm). Keep your knee straight. 3. If you cannot lift your leg this high without arching your back, place a pillow under your hips. 4. Hold the position for 10-15 seconds. 5. Slowly lower your leg to the starting position and allow it to relax completely before you start the next repetition. Repeat 5 times. Complete this exercise 3-5 times a day. Exercise B: Bridge ( hip extensors) 1. Lie on your back on a firm surface with your knees bent and your feet flat on the floor. 2. Tighten your buttocks muscles and lift your bottom off the floor until your trunk is level with your thighs. ? You should feel the muscles working in your buttocks and the back of your thighs. ? Do not arch your back. 3. Hold this position for 5-10 seconds. 4. Slowly lower your hips to the starting position. 5. Let your buttocks muscles relax completely between repetitions. Repeat 5 times. Complete this exercise 3-5 times a day. Exercise C:  Lateral walking with band ( hip abductors) 1. Stand in a long hallway. 2. Wrap a loop of exercise band around your legs, just above your knees. 3. Bend your knees gently and drop your hips down and back so your weight is over your heels. 4. Step to the side to move down the length of the hallway, keeping your toes pointed ahead of you and keeping tension in the band. 5. Repeat, leading with your other leg. Repeat 3-5 times. Complete this exercise 1-2 times a day. Exercise D: Single leg stand with reaching ( eccentric hamstring) 1. Stand on your left / right foot. Keep your big toe down on the floor and try to keep your arch lifted. 2. Slowly reach down toward the floor as far as you can while keeping your balance. 3. Hold this position for 10-15 seconds. Repeat 5 times. Complete this exercise 3-5 times a day.  Document Released: 02/07/2005 Document Revised: 10/15/2015 Document Reviewed: 11/06/2014 Elsevier Interactive Patient Education  [employer].

## 2016-09-03 ENCOUNTER — Other Ambulatory Visit: Payer: Self-pay | Admitting: Osteopathic Medicine

## 2016-09-07 ENCOUNTER — Ambulatory Visit: Payer: Medicare Other | Admitting: Osteopathic Medicine

## 2016-09-07 DIAGNOSIS — Z0189 Encounter for other specified special examinations: Secondary | ICD-10-CM

## 2016-09-07 LAB — HM MAMMOGRAPHY

## 2016-09-14 ENCOUNTER — Ambulatory Visit (INDEPENDENT_AMBULATORY_CARE_PROVIDER_SITE_OTHER): Payer: BLUE CROSS/BLUE SHIELD | Admitting: Osteopathic Medicine

## 2016-09-14 ENCOUNTER — Encounter: Payer: Self-pay | Admitting: Osteopathic Medicine

## 2016-09-14 VITALS — BP 137/80 | HR 88 | Ht 64.0 in | Wt 252.0 lb

## 2016-09-14 DIAGNOSIS — Z0000 Encounter for general adult medical examination without abnormal findings: Secondary | ICD-10-CM

## 2016-09-14 DIAGNOSIS — F339 Major depressive disorder, recurrent, unspecified: Secondary | ICD-10-CM

## 2016-09-14 DIAGNOSIS — K219 Gastro-esophageal reflux disease without esophagitis: Secondary | ICD-10-CM | POA: Diagnosis not present

## 2016-09-14 DIAGNOSIS — Z23 Encounter for immunization: Secondary | ICD-10-CM | POA: Diagnosis not present

## 2016-09-14 DIAGNOSIS — M797 Fibromyalgia: Secondary | ICD-10-CM | POA: Diagnosis not present

## 2016-09-14 DIAGNOSIS — E118 Type 2 diabetes mellitus with unspecified complications: Secondary | ICD-10-CM

## 2016-09-14 DIAGNOSIS — Z794 Long term (current) use of insulin: Secondary | ICD-10-CM

## 2016-09-14 DIAGNOSIS — M069 Rheumatoid arthritis, unspecified: Secondary | ICD-10-CM

## 2016-09-14 DIAGNOSIS — I10 Essential (primary) hypertension: Secondary | ICD-10-CM

## 2016-09-14 MED ORDER — TRAZODONE HCL 50 MG PO TABS: 50.0000 mg | ORAL_TABLET | Freq: Every day | ORAL | 1 refills | Status: DC

## 2016-09-14 NOTE — Patient Instructions (Addendum)
Preventive care discussed:  Recent mammogram reviewed, plan to repeat in one year   Pap smear recommended in 2020  Advance directive recommended - see extra information printed  Recommend eye exam every year   Recommend Tetanus booster and Pneumonia shots   Will try new medication, Trazodone, for depression and sleep  Recommend Calcium 1300 mg daily and Vitamin D 1000 units daily  Will try to get colonoscopy results

## 2016-09-14 NOTE — Addendum Note (Signed)
Addended by: Donne Anon L on: 09/14/2016 02:21 PM   Modules accepted: Orders

## 2016-09-14 NOTE — Progress Notes (Signed)
HPI: Dominique Russell is a 61 y.o. female  who presents to [facility] today, 09/14/16,  for Medicare Annual Wellness Exam  Patient presents for annual physical/Medicare wellness exam. No complaints today.   Past medical, surgical, social and family history reviewed:  Patient Active Problem List   Diagnosis Date Noted  . Essential hypertension 03/17/2016  . Psoriasis 03/17/2016  . Long-term insulin use in type 2 diabetes ([place]) 02/09/2015  . Morbid obesity with BMI of 45.0-49.9, adult ([place]) 05/16/2014  . DM (diabetes mellitus) ([place]) 01/23/2013  . Low back pain 01/23/2013  . Shoulder pain 01/23/2013  . Bilateral knee pain 12/26/2012  . Bilateral shoulder pain 12/26/2012  . Essential tremor 09/26/2012  . Headache 09/26/2012  . Benign essential tremor 09/26/2012  . Fibromyalgia 03/02/2011  . Generalized osteoarthritis of multiple sites 03/02/2011  . Rheumatoid arthritis ([place]) 03/02/2011    No past surgical history on file.  Social History   Social History  . Marital status: Legally Separated    Spouse name: N/A  . Number of children: 2  . Years of education: HS   Occupational History  . Not on file.   Social History Main Topics  . Smoking status: Former Smoker    Quit date: 02/22/1995  . Smokeless tobacco: Never Used  . Alcohol use No  . Drug use: No  . Sexual activity: Not on file   Other Topics Concern  . Not on file   Social History Narrative   Patient lives at home with her daughter.   Caffeine Use: drinks regurlarly    Family History  Problem Relation Age of Onset  . Depression Mother   . Hypertension Mother   . Kidney failure Brother   . Tremor Neg Hx      Current medication list and allergy/intolerance information reviewed:    Outpatient Encounter Prescriptions as of 09/14/2016  Medication Sig Note  . acetaminophen (TYLENOL) 650 MG CR tablet Take 650 mg by mouth every 8 (eight) hours as needed for pain.   Marland Kitchen  aspirin (GOODSENSE ASPIRIN) 325 MG tablet Take 325 mg by mouth.   Marland Kitchen atorvastatin (LIPITOR) 20 MG tablet Take 0.5 tablets (10 mg total) by mouth daily.   . B-D UF III MINI PEN NEEDLES 31G X 5 MM MISC USE AS DIRECTED WITH INSULIN AND VICTOZA.   Marland Kitchen Cholecalciferol (VITAMIN D-3) 1000 units CAPS Take by mouth.   . clobetasol ointment (TEMOVATE) 1.01 % Apply 1 application topically 2 (two) times daily. To affected area(s) as needed, max 2-3 weeks to avoid whitening/thinning skin   . diclofenac sodium (VOLTAREN) 1 % GEL Place onto the skin. 08/06/2014: Received from: [facility]  . DULoxetine (CYMBALTA) 60 MG capsule Take 60 mg by mouth. 08/06/2014: Received from: [facility]  . fluticasone (FLONASE) 50 MCG/ACT nasal spray  08/10/2015: As needed  . folic acid (FOLVITE) 751 MCG tablet Take 400 mcg by mouth daily.   Marland Kitchen HUMULIN 70/30 KWIKPEN (70-30) 100 UNIT/ML PEN  08/06/2014: Received from: External Pharmacy  . liraglutide (VICTOZA) 18 MG/3ML SOPN Inject 1.8 mg into the skin.   Marland Kitchen losartan (COZAAR) 100 MG tablet Take 1 tablet (100 mg total) by mouth daily.   Marland Kitchen LYRICA 150 MG capsule Take 1 tablet by mouth 3 (three) times daily. 01/30/2014: Received Sig:   . meloxicam (MOBIC) 15 MG tablet    . metFORMIN (GLUCOPHAGE) 850 MG tablet Take 850 mg by mouth. 08/10/2015: Received from: [facility]  .  Omega-3 Fatty Acids (FISH OIL PEARLS) 300 MG CAPS Take 1 tablet by mouth 2 (two) times daily.   . Omega-3 Fatty Acids (FISH OIL) 1000 MG CAPS Take by mouth.   Marland Kitchen omeprazole (PRILOSEC) 20 MG capsule Take 1 capsule (20 mg total) by mouth daily. For 2 months   . ONE TOUCH ULTRA TEST test strip    . pioglitazone-metformin (ACTOPLUS MET) 15-850 MG per tablet Take 1 tablet by mouth 2 (two) times daily with a meal.   . potassium gluconate 595 MG TABS tablet Take 595 mg by mouth.   . primidone (MYSOLINE) 50 MG tablet Take 1 tablet (50 mg total) by mouth 2 (two) times daily.   . tizanidine (ZANAFLEX) 2 MG  capsule Take 1 capsule (2 mg total) by mouth 3 (three) times daily as needed.   . vitamin B-12 (CYANOCOBALAMIN) 1000 MCG tablet Take by mouth.   . vitamin E 400 UNIT capsule Take 400 Units by mouth daily.   . [DISCONTINUED] omeprazole (PRILOSEC) 20 MG capsule TAKE 1 CAPSULE (20 MG TOTAL) BY MOUTH DAILY. FOR 2 MONTHS    No facility-administered encounter medications on file as of 09/14/2016.     No Known Allergies     Review of Systems: Review of Systems - General ROS: negative Psychological ROS: positive for - depression Respiratory ROS: no cough, shortness of breath, or wheezing Cardiovascular ROS: no chest pain or dyspnea on exertion Gastrointestinal ROS: no abdominal pain, change in bowel habits, or black or bloody stools   Medicare Wellness Questionnaire  Are there smokers in your home (other than you)? no  Depression Screen (Note: if answer to either of the following is "Yes", a more complete depression screening is indicated)   Q1: Over the past two weeks, have you felt down, depressed or hopeless? yes  Q2: Over the past two weeks, have you felt little interest or pleasure in doing things? yes  Have you lost interest or pleasure in daily life? yes  Do you often feel hopeless? yes  Do you cry easily over simple problems? Yes (+)depression screen discussed: pt has been struggling with getting to see psychiatrist due to expense of dental costs. Used to be on Xanax few times a day and this helped with sleeping. On Cymbalta now. Hx fibromyalgia. Has bee non Elavil which didn't work.   Activities of Daily Living In your present state of health, do you have any difficulty performing the following activities?:  Driving? no Managing money?  yes Feeding yourself? no Getting from bed to chair? no Climbing a flight of stairs? no Preparing food and eating?: yes Bathing or showering? no Getting dressed: no Getting to the toilet? no Using the toilet: no Moving around from place to  place: yes In the past year have you fallen or had a near fall?: no  Hearing Difficulties:  Do you often ask people to speak up or repeat themselves? yes Do you experience ringing or noises in your ears? no  Do you have difficulty understanding soft or whispered voices? yes  Memory Difficulties:  Do you feel that you have a problem with memory? yes  Do you often misplace items? yes  Do you feel safe at home?  yes  Sexual Health:   Are you sexually active?  Yes  Do you have more than one partner?  No  Advanced Directives:   Advanced directives discussed: has NO advanced directive  - add't info requested. Referral to SW: no  Additional information provided: yes  Risk Factors  Current exercise habits: None  Dietary issues discussed:Low Carb  Cardiac risk factors: known cardiac disease, Diabetes Mellitus, hypertension, generalized debilitation, positive family history  Other physicians/providers:   Endocrine: Dr Ladonna Snide   Pain: Dr Darral Dash   Neurology: Dr [name]   Optomery: Dr. Diona Foley   Psychiatry - not sure name       Exam:  BP 137/80   Pulse 88   Ht 5' 4"  (1.626 m)   Wt 252 lb (114.3 kg)   BMI 43.26 kg/m   Constitutional: VS see above. General Appearance: alert, well-developed, well-nourished, NAD  Ears, Nose, Mouth, Throat: MMM  Neck: No masses, trachea midline.   Respiratory: Normal respiratory effort. no wheeze, no rhonchi, no rales  Cardiovascular:No lower extremity edema.   Musculoskeletal: Gait normal. No clubbing/cyanosis of digits.   Neurological: Normal balance/coordination. No tremor. Recalls 3 objects and able to read face of watch with correct time.   Skin: warm, dry, intact. No rash/ulcer.   Psychiatric: Normal judgment/insight. Normal mood and affect. Oriented x3.     ASSESSMENT/PLAN: The primary encounter diagnosis was Annual physical exam. Diagnoses of Essential hypertension, Type 2 diabetes mellitus with complication, with long-term current  use of insulin ([place]), Gastroesophageal reflux disease, esophagitis presence not specified, Fibromyalgia, Rheumatoid arthritis, involving unspecified site, unspecified rheumatoid factor presence ([place]), and Depression, recurrent ([place]) were also pertinent to this visit.    CANCER SCREENING  Lung - does not need  Colon - does not need - we need records though   Breast - does not need - recently done  Cervical - does not need - normal results 2015 OTHER DISEASE SCREENING  Lipid - does not need  DM2 - does not need  Osteoporosis - does not need INFECTIOUS DISEASE SCREENING  HIV - needs - declined  GC/CT - does not need  HepC -needs - declined  TB - does not need ADULT VACCINATION  Influenza - annual vaccine recommended  Td - booster every 10 years - needs  Zoster - option at 53, yes at 31+ - needs  PCV13 - does not need  PPSV23 - needs Immunization History  Administered Date(s) Administered  . Influenza-Unspecified 11/22/2015   OTHER  Fall - exercise and Vit D age 59+ - needs  Advanced Directives -  Discussed as above   During the course of the visit the patient was educated and counseled about appropriate screening and preventive services as noted above.   Patient Instructions (the written plan) was given to the patient.  Medicare Attestation I have personally reviewed: The patient's medical and social history Their use of alcohol, tobacco or illicit drugs Their current medications and supplements The patient's functional ability including ADLs,fall risks, home safety risks, cognitive, and hearing and visual impairment Diet and physical activities Evidence for depression or mood disorders  The patient's weight, height, BMI, and visual acuity have been recorded in the chart.  I have made referrals, counseling, and provided education to the patient based on review of the above and I have provided the patient with a written personalized care plan for preventive  services.    Patient Instructions  Preventive care discussed:  Recent mammogram reviewed, plan to repeat in one year   Pap smear recommended in 2020  Advance directive recommended - see extra information printed  Recommend eye exam every year   Recommend Tetanus booster and Pneumonia shots   Will try new medication, Trazodone, for depression and sleep  Recommend Calcium 1300 mg daily and  Vitamin D 1000 units daily  Will try to get colonoscopy results         [name], DO   09/14/16   Visit summary with medication list and pertinent instructions was printed for patient to review. All questions at time of visit were answered - patient instructed to contact office with any additional concerns. ER/RTC precautions were reviewed with the patient. Follow-up plan: No Follow-up on file.

## 2016-09-18 ENCOUNTER — Other Ambulatory Visit: Payer: Self-pay | Admitting: Osteopathic Medicine

## 2016-09-21 DIAGNOSIS — M170 Bilateral primary osteoarthritis of knee: Secondary | ICD-10-CM | POA: Diagnosis not present

## 2016-09-21 DIAGNOSIS — G894 Chronic pain syndrome: Secondary | ICD-10-CM | POA: Diagnosis not present

## 2016-09-21 DIAGNOSIS — M545 Low back pain: Secondary | ICD-10-CM | POA: Diagnosis not present

## 2016-09-21 DIAGNOSIS — M19011 Primary osteoarthritis, right shoulder: Secondary | ICD-10-CM | POA: Diagnosis not present

## 2016-10-19 ENCOUNTER — Other Ambulatory Visit: Payer: Self-pay | Admitting: Osteopathic Medicine

## 2016-10-21 ENCOUNTER — Other Ambulatory Visit: Payer: Self-pay | Admitting: Osteopathic Medicine

## 2016-10-22 ENCOUNTER — Other Ambulatory Visit: Payer: Self-pay | Admitting: Osteopathic Medicine

## 2016-10-22 DIAGNOSIS — F339 Major depressive disorder, recurrent, unspecified: Secondary | ICD-10-CM

## 2016-10-26 ENCOUNTER — Encounter: Payer: Self-pay | Admitting: Osteopathic Medicine

## 2016-10-26 ENCOUNTER — Ambulatory Visit (INDEPENDENT_AMBULATORY_CARE_PROVIDER_SITE_OTHER): Payer: Managed Care, Other (non HMO) | Admitting: Osteopathic Medicine

## 2016-10-26 VITALS — BP 141/83 | HR 92 | Ht 63.0 in | Wt 246.0 lb

## 2016-10-26 DIAGNOSIS — Z23 Encounter for immunization: Secondary | ICD-10-CM | POA: Diagnosis not present

## 2016-10-26 DIAGNOSIS — T753XXA Motion sickness, initial encounter: Secondary | ICD-10-CM | POA: Diagnosis not present

## 2016-10-26 DIAGNOSIS — B351 Tinea unguium: Secondary | ICD-10-CM

## 2016-10-26 DIAGNOSIS — E162 Hypoglycemia, unspecified: Secondary | ICD-10-CM | POA: Diagnosis not present

## 2016-10-26 DIAGNOSIS — F339 Major depressive disorder, recurrent, unspecified: Secondary | ICD-10-CM | POA: Diagnosis not present

## 2016-10-26 DIAGNOSIS — R251 Tremor, unspecified: Secondary | ICD-10-CM

## 2016-10-26 DIAGNOSIS — R350 Frequency of micturition: Secondary | ICD-10-CM | POA: Diagnosis not present

## 2016-10-26 DIAGNOSIS — R600 Localized edema: Secondary | ICD-10-CM | POA: Diagnosis not present

## 2016-10-26 MED ORDER — SCOPOLAMINE 1 MG/3DAYS TD PT72: 1.0000 | MEDICATED_PATCH | TRANSDERMAL | 1 refills | Status: DC

## 2016-10-26 NOTE — Patient Instructions (Signed)
If you decide you want to go up on the Trazodone, we can try 100 mg per night (2 tablets of the 50 mgs that you have been taking)

## 2016-10-26 NOTE — Progress Notes (Signed)
HPI: Dominique Russell is a 61 y.o. female  who presents to [facility] today, 10/26/16,  for chief complaint of:  Chief Complaint  Patient presents with  . Follow-up    depression  . Edema  . Urinary Frequency  . Tremors    Briefly discussed depression issues last visit at [facility]. We started Trazodone to help with this and with sleep. She found it hasn't made too much of a difference, she is reluctant to go up on the dose of the trazodone. She notes that depression/anxiety is not much different, she gets very nervous coming to doctor's appointments.  Urinary frequency: No associated dysuria or lower abdominal pain. No unusual vaginal discharge. Notes that she is going maybe a few more times in the night than she used to.  Requests medication for seasickness, will be going on cruise soon.  History of tremor: Requests referral to other neurologist for second opinion, she states previous neurologist told her that she had basically tried everything and there was nothing more that they could really offer her.  She is concerned about lower extremity edema. No history of heart failure, she is not wearing compression stockings. She notes that edema resolves after periods of leg elevation.  Requests referral to foot doctor for toenail concern    Past medical history, surgical history, social history and family history reviewed.  Patient Active Problem List   Diagnosis Date Noted  . Essential hypertension 03/17/2016  . Psoriasis 03/17/2016  . Long-term insulin use in type 2 diabetes ([place]) 02/09/2015  . Morbid obesity with BMI of 45.0-49.9, adult ([place]) 05/16/2014  . DM (diabetes mellitus) ([place]) 01/23/2013  . Low back pain 01/23/2013  . Shoulder pain 01/23/2013  . Bilateral knee pain 12/26/2012  . Bilateral shoulder pain 12/26/2012  . Essential tremor 09/26/2012  . Headache 09/26/2012  . Benign essential tremor 09/26/2012  . Fibromyalgia 03/02/2011  .  Generalized osteoarthritis of multiple sites 03/02/2011  . Rheumatoid arthritis ([place]) 03/02/2011    Current medication list and allergy/intolerance information reviewed.   Current Outpatient Prescriptions on File Prior to Visit  Medication Sig Dispense Refill  . acetaminophen (TYLENOL) 650 MG CR tablet Take 650 mg by mouth every 8 (eight) hours as needed for pain.    Marland Kitchen aspirin (GOODSENSE ASPIRIN) 325 MG tablet Take 325 mg by mouth.    Marland Kitchen atorvastatin (LIPITOR) 20 MG tablet Take 0.5 tablets (10 mg total) by mouth daily. 45 tablet 3  . B-D UF III MINI PEN NEEDLES 31G X 5 MM MISC USE AS DIRECTED WITH INSULIN AND VICTOZA.  11  . Cholecalciferol (VITAMIN D-3) 1000 units CAPS Take by mouth.    . clobetasol ointment (TEMOVATE) 4.08 % Apply 1 application topically 2 (two) times daily. To affected area(s) as needed, max 2-3 weeks to avoid whitening/thinning skin 45 g 1  . diclofenac sodium (VOLTAREN) 1 % GEL Place onto the skin.    . fluticasone (FLONASE) 50 MCG/ACT nasal spray     . folic acid (FOLVITE) 144 MCG tablet Take 400 mcg by mouth daily.    Marland Kitchen HUMULIN 70/30 KWIKPEN (70-30) 100 UNIT/ML PEN     . liraglutide (VICTOZA) 18 MG/3ML SOPN Inject 1.8 mg into the skin.    Marland Kitchen losartan (COZAAR) 100 MG tablet Take 1 tablet (100 mg total) by mouth daily. 90 tablet 1  . LYRICA 150 MG capsule Take 1 tablet by mouth 3 (three) times daily.    . meloxicam (MOBIC) 15 MG  tablet     . metFORMIN (GLUCOPHAGE) 850 MG tablet Take 850 mg by mouth.    . Omega-3 Fatty Acids (FISH OIL) 1000 MG CAPS Take by mouth.    Marland Kitchen omeprazole (PRILOSEC) 20 MG capsule TAKE 1 CAPSULE (20 MG TOTAL) BY MOUTH DAILY. FOR 2 MONTHS 60 capsule 0  . ONE TOUCH ULTRA TEST test strip     . pioglitazone-metformin (ACTOPLUS MET) 15-850 MG per tablet Take 1 tablet by mouth 2 (two) times daily with a meal.    . potassium gluconate 595 MG TABS tablet Take 595 mg by mouth.    . primidone (MYSOLINE) 50 MG tablet Take 1 tablet (50 mg total) by mouth 2  (two) times daily. 180 tablet 4  . tiZANidine (ZANAFLEX) 2 MG tablet TAKE 1 TABLET (2 MG TOTAL) BY MOUTH 3 (THREE) TIMES DAILY AS NEEDED-CAPSULES ARE NOT COVERED. 90 tablet 0  . traZODone (DESYREL) 50 MG tablet TAKE 1 TABLET BY MOUTH EVERYDAY AT BEDTIME 90 tablet 1  . vitamin B-12 (CYANOCOBALAMIN) 1000 MCG tablet Take by mouth.    . vitamin E 400 UNIT capsule Take 400 Units by mouth daily.    . DULoxetine (CYMBALTA) 60 MG capsule Take 60 mg by mouth.     No current facility-administered medications on file prior to visit.    No Known Allergies    Review of Systems:  Constitutional: No recent illness  HEENT: No  headache, no vision change  Cardiac: No  chest pain, No  pressure, No palpitations, +LE edema  Respiratory:  No  shortness of breath. No  Cough  Gastrointestinal: No  abdominal pain, no change on bowel habits  Musculoskeletal: No new myalgia/arthralgia  Skin: No  Rash, +toenail concern   Hem/Onc: No  easy bruising/bleeding, No  abnormal lumps/bumps  Neurologic: No  weakness, No  Dizziness  Psychiatric: +concerns with depression, +concerns with anxiety  Exam:  BP (!) 141/83   Pulse 92   Ht 5' 3" (1.6 m)   Wt 246 lb (111.6 kg)   BMI 43.58 kg/m   Constitutional: VS see above. General Appearance: alert, well-developed, well-nourished, NAD  Eyes: Normal lids and conjunctive, non-icteric sclera  Ears, Nose, Mouth, Throat: MMM, Normal external inspection ears/nares/mouth/lips/gums.  Neck: No masses, trachea midline.   Respiratory: Normal respiratory effort. no wheeze, no rhonchi, no rales  Cardiovascular: S1/S2 normal, no murmur, no rub/gallop auscultated. RRR. Trace LE edema  Musculoskeletal: Gait normal. Symmetric and independent movement of all extremities  Neurological: Normal balance/coordination. No tremor.  Skin: warm, dry, intact.   Psychiatric: Normal judgment/insight. Normal mood and affect. Oriented x3.    Recent Results (from the past 2160  hour(s))  CBC     Status: None   Collection Time: 10/26/16  2:06 PM  Result Value Ref Range   WBC 5.7 3.8 - 10.8 Thousand/uL   RBC 4.30 3.80 - 5.10 Million/uL   Hemoglobin 11.9 11.7 - 15.5 g/dL   HCT 36.6 35.0 - 45.0 %   MCV 85.1 80.0 - 100.0 fL   MCH 27.7 27.0 - 33.0 pg   MCHC 32.5 32.0 - 36.0 g/dL   RDW 12.8 11.0 - 15.0 %   Platelets 198 140 - 400 Thousand/uL   MPV 10.7 7.5 - 12.5 fL  COMPLETE METABOLIC PANEL WITH GFR     Status: Abnormal   Collection Time: 10/26/16  2:06 PM  Result Value Ref Range   Glucose, Bld 42 (L) 65 - 99 mg/dL    Comment: Verified by  repeat analysis. Marland Kitchen .            Fasting reference interval .    BUN 19 7 - 25 mg/dL   Creat 1.10 (H) 0.50 - 0.99 mg/dL    Comment: For patients >15 years of age, the reference limit for Creatinine is approximately 13% higher for people identified as African-American. .    GFR, Est Non African American 53 (L) > OR = 60 mL/min/1.19m   GFR, Est African American 62 > OR = 60 mL/min/1.718m  BUN/Creatinine Ratio 17 6 - 22 (calc)   Sodium 141 135 - 146 mmol/L   Potassium 4.2 3.5 - 5.3 mmol/L   Chloride 103 98 - 110 mmol/L   CO2 27 20 - 32 mmol/L   Calcium 9.4 8.6 - 10.4 mg/dL   Total Protein 7.2 6.1 - 8.1 g/dL   Albumin 4.2 3.6 - 5.1 g/dL   Globulin 3.0 1.9 - 3.7 g/dL (calc)   AG Ratio 1.4 1.0 - 2.5 (calc)   Total Bilirubin 0.3 0.2 - 1.2 mg/dL   Alkaline phosphatase (APISO) 57 33 - 130 U/L   AST 26 10 - 35 U/L   ALT 19 6 - 29 U/L  TSH     Status: None   Collection Time: 10/26/16  2:06 PM  Result Value Ref Range   TSH 1.02 0.40 - 4.50 mIU/L  B Nat Peptide     Status: None   Collection Time: 10/26/16  2:06 PM  Result Value Ref Range   Brain Natriuretic Peptide 6 <100 pg/mL    Comment: . BNP levels increase with age in the general population with the highest values seen in individuals greater than 7568ears of age. Reference: J. Am. [place] ArCardiol. 2002; ; 68:088-110. Marland Kitchen Urinalysis, Routine w reflex microscopic      Status: Abnormal   Collection Time: 10/26/16  2:08 PM  Result Value Ref Range   Color, Urine DARK YELLOW YELLOW   APPearance CLEAR CLEAR   Specific Gravity, Urine 1.022 1.001 - 1.03   pH 6.0 5.0 - 8.0   Glucose, UA NEGATIVE NEGATIVE   Bilirubin Urine NEGATIVE NEGATIVE   Ketones, ur NEGATIVE NEGATIVE   Hgb urine dipstick NEGATIVE NEGATIVE   Protein, ur NEGATIVE NEGATIVE   Nitrite NEGATIVE NEGATIVE   Leukocytes, UA TRACE (A) NEGATIVE   WBC, UA NONE SEEN 0 - 5 /HPF   RBC / HPF 0-2 0 - 2 /HPF   Squamous Epithelial / LPF 0-5 < OR = 5 /HPF   Bacteria, UA NONE SEEN NONE SEEN /HPF   Hyaline Cast NONE SEEN NONE SEEN /LPF     [facility] 10/26/2016 09/14/2016  Decreased Interest 2 2  Down, Depressed, Hopeless 3 3  PHQ - 2 Score 5 5  Altered sleeping 2 3  Tired, decreased energy 3 3  Change in appetite 3 3  Feeling bad or failure about yourself  2 1  Trouble concentrating 3 3  Moving slowly or fidgety/restless 2 3  Suicidal thoughts 0 0  PHQ-9 Score 20 21      ASSESSMENT/PLAN: Overall stable chronic medical issues, see below for details. She is following up with endocrinology later today or tomorrow.  Depression, recurrent ([place]- Patient would like to keep medications same for now, declines counseling referral, consider psychiatry - Plan: CBC, COMPLETE METABOLIC PANEL WITH GFR, TSH  Need for immunization against influenza - Plan: Flu Vaccine QUAD 36+ mos IM  Urinary frequency - Plan:  Urinalysis, Routine w reflex microscopic  Onychomycosis - Plan: Ambulatory referral to [facility] sickness, initial encounter - Plan: scopolamine (TRANSDERM-SCOP, 1.5 MG,) 1 MG/3DAYS  Tremor - Plan: Ambulatory referral to Neurology  Lower extremity edema - Plan: CBC, COMPLETE METABOLIC PANEL WITH GFR, TSH, B Nat Peptide  Hypoglycemia - Incidental finding on labs, patient is following with endocrinology, see lab result note    Patient Instructions  If you decide you want  to go up on the Trazodone, we can try 100 mg per night (2 tablets of the 50 mgs that you have been taking)     Follow-up plan: Return in about 3 months (around 01/25/2017) for recheck problems (feet, swelling, depression/anxiety), sooner if needed.  Visit summary with medication list and pertinent instructions was printed for patient to review, alert [place] if any changes needed. All questions at time of visit were answered - patient instructed to contact office with any additional concerns. ER/RTC precautions were reviewed with the patient and understanding verbalized.   Note: Total time spent 40 minutes, greater than 50% of the visit was spent face-to-face counseling and coordinating care for the following: The primary encounter diagnosis was Depression, recurrent ([place]). Diagnoses of Need for immunization against influenza, Urinary frequency, Onychomycosis, Sea sickness, initial encounter, Tremor, Lower extremity edema, and Hypoglycemia were also pertinent to this visit.Marland Kitchen

## 2016-10-27 LAB — URINALYSIS, ROUTINE W REFLEX MICROSCOPIC
BILIRUBIN URINE: NEGATIVE
Bacteria, UA: NONE SEEN /HPF
Glucose, UA: NEGATIVE
Hgb urine dipstick: NEGATIVE
Hyaline Cast: NONE SEEN /LPF
Ketones, ur: NEGATIVE
NITRITE: NEGATIVE
PROTEIN: NEGATIVE
Specific Gravity, Urine: 1.022 (ref 1.001–1.03)
WBC UA: NONE SEEN /HPF (ref 0–5)
pH: 6 (ref 5.0–8.0)

## 2016-10-27 LAB — COMPLETE METABOLIC PANEL WITH GFR
AG Ratio: 1.4 (calc) (ref 1.0–2.5)
ALT: 19 U/L (ref 6–29)
AST: 26 U/L (ref 10–35)
Albumin: 4.2 g/dL (ref 3.6–5.1)
Alkaline phosphatase (APISO): 57 U/L (ref 33–130)
BILIRUBIN TOTAL: 0.3 mg/dL (ref 0.2–1.2)
BUN/Creatinine Ratio: 17 (calc) (ref 6–22)
BUN: 19 mg/dL (ref 7–25)
CHLORIDE: 103 mmol/L (ref 98–110)
CO2: 27 mmol/L (ref 20–32)
Calcium: 9.4 mg/dL (ref 8.6–10.4)
Creat: 1.1 mg/dL — ABNORMAL HIGH (ref 0.50–0.99)
GFR, EST AFRICAN AMERICAN: 62 mL/min/{1.73_m2} (ref >=60)
GFR, Est Non African American: 53 mL/min/{1.73_m2} — ABNORMAL LOW (ref >=60)
GLUCOSE: 42 mg/dL — AB (ref 65–99)
Globulin: 3 g/dL (calc) (ref 1.9–3.7)
Potassium: 4.2 mmol/L (ref 3.5–5.3)
Sodium: 141 mmol/L (ref 135–146)
TOTAL PROTEIN: 7.2 g/dL (ref 6.1–8.1)

## 2016-10-27 LAB — CBC
HEMATOCRIT: 36.6 % (ref 35.0–45.0)
HEMOGLOBIN: 11.9 g/dL (ref 11.7–15.5)
MCH: 27.7 pg (ref 27.0–33.0)
MCHC: 32.5 g/dL (ref 32.0–36.0)
MCV: 85.1 fL (ref 80.0–100.0)
MPV: 10.7 fL (ref 7.5–12.5)
Platelets: 198 10*3/uL (ref 140–400)
RBC: 4.3 10*6/uL (ref 3.80–5.10)
RDW: 12.8 % (ref 11.0–15.0)
WBC: 5.7 10*3/uL (ref 3.8–10.8)

## 2016-10-27 LAB — TSH: TSH: 1.02 m[IU]/L (ref 0.40–4.50)

## 2016-10-27 LAB — BRAIN NATRIURETIC PEPTIDE: Brain Natriuretic Peptide: 6 pg/mL (ref <100)

## 2016-11-07 ENCOUNTER — Ambulatory Visit (INDEPENDENT_AMBULATORY_CARE_PROVIDER_SITE_OTHER): Payer: Managed Care, Other (non HMO) | Admitting: Podiatry

## 2016-11-07 ENCOUNTER — Encounter: Payer: Self-pay | Admitting: Podiatry

## 2016-11-07 VITALS — BP 130/83 | HR 87 | Ht 64.0 in | Wt 231.0 lb

## 2016-11-07 DIAGNOSIS — L600 Ingrowing nail: Secondary | ICD-10-CM | POA: Diagnosis not present

## 2016-11-07 DIAGNOSIS — M79674 Pain in right toe(s): Secondary | ICD-10-CM | POA: Diagnosis not present

## 2016-11-07 DIAGNOSIS — B351 Tinea unguium: Secondary | ICD-10-CM

## 2016-11-07 DIAGNOSIS — M79672 Pain in left foot: Secondary | ICD-10-CM | POA: Diagnosis not present

## 2016-11-07 DIAGNOSIS — M79671 Pain in right foot: Secondary | ICD-10-CM

## 2016-11-07 DIAGNOSIS — M7989 Other specified soft tissue disorders: Secondary | ICD-10-CM

## 2016-11-07 NOTE — Progress Notes (Signed)
SUBJECTIVE: 61 y.o. year old female presents for diabetic foot care.Patient is ambulatory without assistance.  Been having painful ingrown nails off and on for several years. Been treated by other podiatrist. Patient is referred by Dr. Lyn Hollingshead.  Goes to [facility] for Fibromyalgia and pinch nerve pain. Taking Lyrica also.  Blood sugar was 134 this morning.  REVIEW OF SYSTEMS: A comprehensive reviewed was positive for IDDM, Fibromyalgia, Psoriasis, bilateral knee pain, lower back pain, shoulder pain, Rheumatoid and Osteoarthritis, Hypertension.    OBJECTIVE: DERMATOLOGIC EXAMINATION: Nails: Thick dystrophic ingrown nail without active infection right great toe, painful with closed in shoes.  VASCULAR EXAMINATION OF LOWER LIMBS: All pedal pulses are palpable with normal pulsation.  Capillary Filling times within 3 seconds in all digits.  Left lower limb edema from knee problem. Temperature gradient from tibial crest to dorsum of foot is within normal bilateral.  NEUROLOGIC EXAMINATION OF THE LOWER LIMBS: Achilles DTR is present and within normal. Monofilament (Semmes-Weinstein 10-gm) sensory testing positive 6 out of 6, bilateral. Vibratory sensations(128Hz  turning fork) intact at medial and lateral forefoot bilateral.  Sharp and Dull discriminatory sensations at the plantar ball of hallux is intact bilateral.   MUSCULOSKELETAL EXAMINATION: Positive for hyperextended great toe bilateral.  ASSESSMENT: Onychomycosis with nail deformity and pain right great toe. Lower limb edema left. Pain in lower limb. IDDM.   PLAN: All nails debrided. May benefit from diabetic shoe to accommodate swollen left foot.  Return in 3 months for palliation.

## 2016-11-07 NOTE — Patient Instructions (Signed)
Seen for painful nails. Noted of thick dystrophic nail on right great toe. All nails debrided. May benefit from diabetic shoes.

## 2016-11-15 ENCOUNTER — Encounter: Payer: Self-pay | Admitting: Neurology

## 2016-11-16 ENCOUNTER — Other Ambulatory Visit: Payer: Self-pay | Admitting: Osteopathic Medicine

## 2016-12-06 ENCOUNTER — Other Ambulatory Visit: Payer: Self-pay | Admitting: Osteopathic Medicine

## 2016-12-26 ENCOUNTER — Ambulatory Visit: Payer: Managed Care, Other (non HMO) | Admitting: Neurology

## 2017-01-02 ENCOUNTER — Ambulatory Visit (INDEPENDENT_AMBULATORY_CARE_PROVIDER_SITE_OTHER): Payer: Managed Care, Other (non HMO) | Admitting: Podiatry

## 2017-01-02 ENCOUNTER — Ambulatory Visit: Payer: Managed Care, Other (non HMO) | Admitting: Physician Assistant

## 2017-01-02 ENCOUNTER — Encounter: Payer: Self-pay | Admitting: Podiatry

## 2017-01-02 DIAGNOSIS — M216X2 Other acquired deformities of left foot: Secondary | ICD-10-CM | POA: Diagnosis not present

## 2017-01-02 DIAGNOSIS — M79672 Pain in left foot: Secondary | ICD-10-CM

## 2017-01-02 DIAGNOSIS — M722 Plantar fascial fibromatosis: Secondary | ICD-10-CM | POA: Diagnosis not present

## 2017-01-02 NOTE — Progress Notes (Signed)
Subjective: 61 y.o. year old female patient presents complaining of left heel pain since yesterday. Hurting all night and could not sleep due to pain. Patient is using a cane due to extreme pain that is radiating to her spine. Patient is wearing well supported diabetic shoes. Blood sugar was 130 this morning. Pain is left heel plantar lateral.  Objective: Dermatologic: No abnormal open lesions. Vascular: Pedal pulses are all palpable. No edema or erythema noted. Orthopedic: Hyperextended great toe bilateral. Forefoot varus with elevated first ray left. Normal forefoot and rearfoot range of motion with STJ hyperpronation left. Slightly tight Achilles tendon left. Neurologic: All epicritic and tactile sensations grossly intact.  Assessment: Plantar fasciitis left lateral heel. STJ hyperpronation left. Achilles tendon contracture left. Pain in left foot.  Treatment: Reviewed findings and available treatment options.  Left plantar lateral heel injected with mixture of 4 mg Dexamethasone, 4 mg Triamcinolone, and 1 cc of 0.5% Marcaine plain. Patient tolerated well without difficulty.  Return in 2 weeks if pain continues.

## 2017-01-02 NOTE — Patient Instructions (Signed)
Seen for left heel pain. Cortisone injection given. Return in 2 weeks if pain continues.

## 2017-01-04 NOTE — Progress Notes (Signed)
Subjective:   Dominique Russell was seen in consultation in the movement disorder clinic at the request of Dominique Reeve, DO.  The evaluation is for tremor.  This patient is accompanied in the office by her granddaughter who supplements the history.  Patient has previously seen Dr. Leta Russell for the same and I have reviewed those records in detail.  Patient last saw him in 2017.  Tremor started in the bilateral upper extremities in approximately 2012.  She was on gabapentin at the time.  Records indicate that she had forgotten to take the medication and she thought that this was the source of the tremor initially.  However, when she got back on that, she still had tremor.  She then was started on primidone.  She got up to 100 mg twice daily of primidone and then Topamax was started in 2015.  Topamax was being used for the treatment of headache and tremor.  Topamax was progressively increased to 100 mg twice per day, but she thought that that, in combination with the primidone was causing memory change and she self decreased both of these medications, so that she ended up on primidone, 50 mg twice per day and stopped the Topamax altogether.  She states that she could just tell that it was the primidone that was messing with her memory.    Tremor is most noticeable when she goes to reach for something, especially a full glass of liquid.   There is no family hx of tremor.    Affected by caffeine:  Doesn't drink enough to know (1 cup coffee per day) Affected by alcohol:  No. (drinks 1-2 times per month) Affected by stress:  No. Affected by fatigue:  No. Spills soup if on spoon:  "it will spill all over if my blood sugar is low" Spills glass of liquid if full:  Yes.   Affects ADL's (tying shoes, brushing teeth, etc):  No.  Current/Previously tried tremor medications: primidone, topamax, gabapentin (was on it for neuropathy); now on lyrica  Current medications that may exacerbate tremor:   n/a  Outside reports reviewed: office notes and referral letter/letters.  MRI of the brain is done with and without gadolinium in 2014.  There are no linked images.  It was reported to be normal.  There was an MRI of the brain done in 2013 that I did have the opportunity to review.  This was normal.  No Known Allergies  Outpatient Encounter Medications as of 01/06/2017  Medication Sig  . acetaminophen (TYLENOL) 650 MG CR tablet Take 650 mg by mouth every 8 (eight) hours as needed for pain.  Marland Kitchen aspirin (GOODSENSE ASPIRIN) 325 MG tablet Take 325 mg by mouth.  Marland Kitchen atorvastatin (LIPITOR) 20 MG tablet Take 0.5 tablets (10 mg total) by mouth daily.  . B-D UF III MINI PEN NEEDLES 31G X 5 MM MISC USE AS DIRECTED WITH INSULIN AND VICTOZA.  Marland Kitchen Cholecalciferol (VITAMIN D-3) 1000 units CAPS Take by mouth.  . clobetasol ointment (TEMOVATE) 2.86 % Apply 1 application topically 2 (two) times daily. To affected area(s) as needed, max 2-3 weeks to avoid whitening/thinning skin  . diclofenac sodium (VOLTAREN) 1 % GEL Place onto the skin.  . DULoxetine (CYMBALTA) 60 MG capsule Take 60 mg by mouth.  . fluticasone (FLONASE) 50 MCG/ACT nasal spray   . folic acid (FOLVITE) 381 MCG tablet Take 400 mcg by mouth daily.  Marland Kitchen HUMULIN 70/30 KWIKPEN (70-30) 100 UNIT/ML PEN   . liraglutide (VICTOZA) 18 MG/3ML  SOPN Inject 1.8 mg into the skin.  Marland Kitchen losartan (COZAAR) 100 MG tablet TAKE 1 TABLET (100 MG TOTAL) BY MOUTH DAILY.  Marland Kitchen LYRICA 150 MG capsule Take 1 tablet by mouth 3 (three) times daily.  . meloxicam (MOBIC) 15 MG tablet   . Omega-3 Fatty Acids (FISH OIL) 1000 MG CAPS Take by mouth.  Marland Kitchen omeprazole (PRILOSEC) 20 MG capsule TAKE 1 CAPSULE (20 MG TOTAL) BY MOUTH DAILY. FOR 2 MONTHS  . ONE TOUCH ULTRA TEST test strip   . pioglitazone-metformin (ACTOPLUS MET) 15-850 MG per tablet Take 1 tablet by mouth 2 (two) times daily with a meal.  . potassium gluconate 595 MG TABS tablet Take 595 mg by mouth.  . primidone (MYSOLINE) 50 MG  tablet Take 1 tablet (50 mg total) by mouth 2 (two) times daily.  Marland Kitchen tiZANidine (ZANAFLEX) 2 MG tablet TAKE 1 TABLET (2 MG TOTAL) BY MOUTH 3 (THREE) TIMES DAILY AS NEEDED-CAPSULES ARE NOT COVERED.  Marland Kitchen traZODone (DESYREL) 50 MG tablet TAKE 1 TABLET BY MOUTH EVERYDAY AT BEDTIME  . vitamin B-12 (CYANOCOBALAMIN) 1000 MCG tablet Take by mouth.  . vitamin E 400 UNIT capsule Take 400 Units by mouth daily.  . [DISCONTINUED] metFORMIN (GLUCOPHAGE) 850 MG tablet Take 850 mg by mouth.  . [DISCONTINUED] scopolamine (TRANSDERM-SCOP, 1.5 MG,) 1 MG/3DAYS Place 1 patch (1.5 mg total) onto the skin every 3 (three) days.   No facility-administered encounter medications on file as of 01/06/2017.     Past Medical History:  Diagnosis Date  . Diabetes ([place])   . Essential tremor   . Fibromyalgia   . Hypertension     Past Surgical History:  Procedure Laterality Date  . BREAST REDUCTION SURGERY      Social History   Socioeconomic History  . Marital status: Legally Separated    Spouse name: Not on file  . Number of children: 2  . Years of education: HS  . Highest education level: Not on file  Social Needs  . Financial resource strain: Not on file  . Food insecurity - worry: Not on file  . Food insecurity - inability: Not on file  . Transportation needs - medical: Not on file  . Transportation needs - non-medical: Not on file  Occupational History  . Not on file  Tobacco Use  . Smoking status: Former Smoker    Last attempt to quit: 02/22/1995    Years since quitting: 21.8  . Smokeless tobacco: Never Used  Substance and Sexual Activity  . Alcohol use: No  . Drug use: No  . Sexual activity: Not on file  Other Topics Concern  . Not on file  Social History Narrative   Patient lives at home with her daughter.   Caffeine Use: drinks regurlarly    Family Status  Relation Name Status  . Mother  Deceased at age 38  . Father  Deceased  . Brother  (Not Specified)  . Sister  Deceased  . Daughter  x2 Alive  . Neg Hx  (Not Specified)    Review of Systems Has large joint pain.  Chronic muscle pain with fibromyalgia.  States that she has R arm and leg weakness x 2 years.  Has paresthesias in the feet - better with the lyrica.  A complete 10 system ROS was obtained and was negative apart from what is mentioned.   Objective:   VITALS:   Vitals:   01/06/17 1349  BP: 120/76  Pulse: (!) 104  SpO2: 93%  Weight:  246 lb (111.6 kg)  Height: 5' 4"  (1.626 m)   Gen:  Appears stated age and in NAD. HEENT:  Normocephalic, atraumatic. The mucous membranes are moist. The superficial temporal arteries are without ropiness or tenderness. Cardiovascular: tachycardic.  Regular.   Lungs: Clear to auscultation bilaterally. Neck: There are no carotid bruits noted bilaterally.  NEUROLOGICAL:  Orientation:  The patient is alert and oriented x 3.  Recent and remote memory are intact.  Attention span and concentration are normal.  Able to name objects and repeat without trouble.  Fund of knowledge is appropriate Cranial nerves: There is good facial symmetry. The pupils are equal round and reactive to light bilaterally. Fundoscopic exam reveals clear disc margins bilaterally. Extraocular muscles are intact and visual fields are full to confrontational testing. Speech is fluent and clear. Soft palate rises symmetrically and there is no tongue deviation. Hearing is intact to conversational tone. Tone: Tone is good throughout. Sensation: Sensation is intact to light touch and pinprick throughout (facial, trunk, extremities). Vibration is intact at the bilateral big toe. There is no extinction with double simultaneous stimulation. There is no sensory dermatomal level identified. Coordination:  The patient has no dysdiadichokinesia or dysmetria. Motor: Strength is 5/5 in the bilateral upper and lower extremities.  Shoulder shrug is equal bilaterally.  There is no pronator drift.  There are no fasciculations  noted. DTR's: Deep tendon reflexes are 2/4 at the bilateral biceps, triceps, brachioradialis, patella and achilles.  Plantar responses are downgoing bilaterally. Gait and Station: The patient is able to ambulate without difficulty. The patient is able to heel toe walk without any difficulty. The patient is able to ambulate in a tandem fashion. The patient is able to stand in the Romberg position.   MOVEMENT EXAM: Tremor:  There is no tremor in the UE with posture but there is with intention.  She has no tremor when given a weight.    The patient is not able to draw Archimedes spirals without significant difficulty; the L is worse than the right.  There is no tremor at rest.  The patient is not able to pour water from one glass to another without spilling it.  The water only spills a little when the water is in the L hand.     Assessment/Plan:   1.  Essential Tremor.  -This is evidenced by the symmetrical nature and longstanding hx of gradually getting worse.  We discussed nature and pathophysiology.  We discussed that this can continue to gradually get worse with time.  We discussed that some medications can worsen this, as can caffeine use.  We discussed medication therapy as well as surgical therapy.  Ultimately, the patient decided to add propranolol to her primidone.  We will start with 20 mg bid.  She is to monitor for any side effects, which we discussed in detail.  She is to keep a log of BP's.  Propranolol may have added benefit of helping her tachycardia.  Risks, benefits, side effects and alternative therapies were discussed.  The opportunity to ask questions was given and they were answered to the best of my ability.  The patient expressed understanding and willingness to follow the outlined treatment protocols.  2. Follow up is anticipated in the next few months, sooner should new neurologic issues arise.  This was a moderate complexity visit.    CC:  Dominique Reeve, DO

## 2017-01-06 ENCOUNTER — Encounter: Payer: Self-pay | Admitting: Neurology

## 2017-01-06 ENCOUNTER — Ambulatory Visit (INDEPENDENT_AMBULATORY_CARE_PROVIDER_SITE_OTHER): Payer: Managed Care, Other (non HMO) | Admitting: Neurology

## 2017-01-06 VITALS — BP 120/76 | HR 104 | Ht 64.0 in | Wt 246.0 lb

## 2017-01-06 DIAGNOSIS — G250 Essential tremor: Secondary | ICD-10-CM

## 2017-01-06 DIAGNOSIS — R000 Tachycardia, unspecified: Secondary | ICD-10-CM | POA: Diagnosis not present

## 2017-01-06 MED ORDER — PROPRANOLOL HCL 20 MG PO TABS: 20.0000 mg | ORAL_TABLET | Freq: Two times a day (BID) | ORAL | 1 refills | Status: DC

## 2017-01-06 NOTE — Patient Instructions (Signed)
1.  Start propranolol 20 mg twice per day 2.  Keep a log of blood pressures - record once or twice per week 3.  See you in 3-4 months 4.  Happy thanksgiving and merry christmas!  Good to see you!

## 2017-01-18 ENCOUNTER — Other Ambulatory Visit: Payer: Self-pay | Admitting: Osteopathic Medicine

## 2017-01-18 MED ORDER — HYDROCHLOROTHIAZIDE 25 MG PO TABS: 25.0000 mg | ORAL_TABLET | Freq: Every day | ORAL | 1 refills | Status: DC

## 2017-01-20 DIAGNOSIS — G894 Chronic pain syndrome: Secondary | ICD-10-CM | POA: Diagnosis not present

## 2017-01-20 DIAGNOSIS — M170 Bilateral primary osteoarthritis of knee: Secondary | ICD-10-CM | POA: Diagnosis not present

## 2017-01-20 DIAGNOSIS — M19011 Primary osteoarthritis, right shoulder: Secondary | ICD-10-CM | POA: Diagnosis not present

## 2017-01-20 DIAGNOSIS — M797 Fibromyalgia: Secondary | ICD-10-CM | POA: Diagnosis not present

## 2017-01-24 ENCOUNTER — Other Ambulatory Visit: Payer: Self-pay

## 2017-01-24 DIAGNOSIS — Z794 Long term (current) use of insulin: Secondary | ICD-10-CM | POA: Diagnosis not present

## 2017-01-24 DIAGNOSIS — I10 Essential (primary) hypertension: Secondary | ICD-10-CM | POA: Diagnosis not present

## 2017-01-24 DIAGNOSIS — E119 Type 2 diabetes mellitus without complications: Secondary | ICD-10-CM | POA: Diagnosis not present

## 2017-01-24 DIAGNOSIS — Z7982 Long term (current) use of aspirin: Secondary | ICD-10-CM | POA: Diagnosis not present

## 2017-01-24 DIAGNOSIS — Z79899 Other long term (current) drug therapy: Secondary | ICD-10-CM | POA: Diagnosis not present

## 2017-01-24 DIAGNOSIS — E11649 Type 2 diabetes mellitus with hypoglycemia without coma: Secondary | ICD-10-CM | POA: Diagnosis not present

## 2017-01-24 MED ORDER — ATORVASTATIN CALCIUM 20 MG PO TABS: 10.0000 mg | ORAL_TABLET | Freq: Every day | ORAL | 3 refills | Status: DC

## 2017-01-25 ENCOUNTER — Ambulatory Visit (INDEPENDENT_AMBULATORY_CARE_PROVIDER_SITE_OTHER): Payer: Managed Care, Other (non HMO) | Admitting: Osteopathic Medicine

## 2017-01-25 ENCOUNTER — Encounter: Payer: Self-pay | Admitting: Osteopathic Medicine

## 2017-01-25 VITALS — BP 132/81 | HR 82 | Temp 98.6°F | Resp 16 | Ht 64.0 in | Wt 250.1 lb

## 2017-01-25 DIAGNOSIS — R0789 Other chest pain: Secondary | ICD-10-CM | POA: Diagnosis not present

## 2017-01-25 DIAGNOSIS — F339 Major depressive disorder, recurrent, unspecified: Secondary | ICD-10-CM

## 2017-01-25 DIAGNOSIS — E118 Type 2 diabetes mellitus with unspecified complications: Secondary | ICD-10-CM | POA: Diagnosis not present

## 2017-01-25 DIAGNOSIS — Z794 Long term (current) use of insulin: Secondary | ICD-10-CM

## 2017-01-25 DIAGNOSIS — G4700 Insomnia, unspecified: Secondary | ICD-10-CM

## 2017-01-25 DIAGNOSIS — R600 Localized edema: Secondary | ICD-10-CM | POA: Diagnosis not present

## 2017-01-25 MED ORDER — TRAZODONE HCL 100 MG PO TABS: 100.0000 mg | ORAL_TABLET | Freq: Every day | ORAL | 1 refills | Status: DC

## 2017-01-25 NOTE — Patient Instructions (Signed)
Insomnia Insomnia is a sleep disorder that makes it difficult to fall asleep or to stay asleep. Insomnia can cause tiredness (fatigue), low energy, difficulty concentrating, mood swings, and poor performance at work or school. There are three different ways to classify insomnia:  Difficulty falling asleep.  Difficulty staying asleep.  Waking up too early in the morning.  Any type of insomnia can be long-term (chronic) or short-term (acute). Both are common. Short-term insomnia usually lasts for three months or less. Chronic insomnia occurs at least three times a week for longer than three months. What are the causes? Insomnia may be caused by another condition, situation, or substance, such as:  Anxiety.  Certain medicines.  Gastroesophageal reflux disease (GERD) or other gastrointestinal conditions.  Asthma or other breathing conditions.  Restless legs syndrome, sleep apnea, or other sleep disorders.  Chronic pain.  Menopause. This may include hot flashes.  Stroke.  Abuse of alcohol, tobacco, or illegal drugs.  Depression.  Caffeine.  Neurological disorders, such as Alzheimer disease.  An overactive thyroid (hyperthyroidism).  The cause of insomnia may not be known. What increases the risk? Risk factors for insomnia include:  Gender. Women are more commonly affected than men.  Age. Insomnia is more common as you get older.  Stress. This may involve your professional or personal life.  Income. Insomnia is more common in people with lower income.  Lack of exercise.  Irregular work schedule or night shifts.  Traveling between different time zones.  What are the signs or symptoms? If you have insomnia, trouble falling asleep or trouble staying asleep is the main symptom. This may lead to other symptoms, such as:  Feeling fatigued.  Feeling nervous about going to sleep.  Not feeling rested in the morning.  Having trouble concentrating.  Feeling  irritable, anxious, or depressed.  How is this treated? Treatment for insomnia depends on the cause. If your insomnia is caused by an underlying condition, treatment will focus on addressing the condition. Treatment may also include:  Medicines to help you sleep.  Counseling or therapy.  Lifestyle adjustments.  Follow these instructions at home:  Take medicines only as directed by your health care provider.  Keep regular sleeping and waking hours. Avoid naps.  Keep a sleep diary to help you and your health care provider figure out what could be causing your insomnia. Include: ? When you sleep. ? When you wake up during the night. ? How well you sleep. ? How rested you feel the next day. ? Any side effects of medicines you are taking. ? What you eat and drink.  Make your bedroom a comfortable place where it is easy to fall asleep: ? Put up shades or special blackout curtains to block light from outside. ? Use a white noise machine to block noise. ? Keep the temperature cool.  Exercise regularly as directed by your health care provider. Avoid exercising right before bedtime.  Use relaxation techniques to manage stress. Ask your health care provider to suggest some techniques that may work well for you. These may include: ? Breathing exercises. ? Routines to release muscle tension. ? Visualizing peaceful scenes.  Cut back on alcohol, caffeinated beverages, and cigarettes, especially close to bedtime. These can disrupt your sleep.  Do not overeat or eat spicy foods right before bedtime. This can lead to digestive discomfort that can make it hard for you to sleep.  Limit screen use before bedtime. This includes: ? Watching TV. ? Using your smartphone, tablet, and   computer.  Stick to a routine. This can help you fall asleep faster. Try to do a quiet activity, brush your teeth, and go to bed at the same time each night.  Get out of bed if you are still awake after 15 minutes  of trying to sleep. Keep the lights down, but try reading or doing a quiet activity. When you feel sleepy, go back to bed.  Make sure that you drive carefully. Avoid driving if you feel very sleepy.  Keep all follow-up appointments as directed by your health care provider. This is important. Contact a health care provider if:  You are tired throughout the day or have trouble in your daily routine due to sleepiness.  You continue to have sleep problems or your sleep problems get worse. Get help right away if:  You have serious thoughts about hurting yourself or someone else. This information is not intended to replace advice given to you by your health care provider. Make sure you discuss any questions you have with your health care provider. Document Released: 02/05/2000 Document Revised: 07/10/2015 Document Reviewed: 11/08/2013 Elsevier Interactive Patient Education  2018 [employer]    Chest Wall Pain Chest wall pain is pain in or around the bones and muscles of your chest. Sometimes, an injury causes this pain. Sometimes, the cause may not be known. This pain may take several weeks or longer to get better. Follow these instructions at home: Pay attention to any changes in your symptoms. Take these actions to help with your pain:  Rest as told by your health care provider.  Avoid activities that cause pain. These include any activities that use your chest muscles or your abdominal and side muscles to lift heavy items.  If directed, apply ice to the painful area: ? Put ice in a plastic bag. ? Place a towel between your skin and the bag. ? Leave the ice on for 20 minutes, 2-3 times per day.  Take over-the-counter and prescription medicines only as told by your health care provider.  Do not use tobacco products, including cigarettes, chewing tobacco, and e-cigarettes. If you need help quitting, ask your health care provider.  Keep all follow-up visits as told by your health  care provider. This is important.  Contact a health care provider if:  You have a fever.  Your chest pain becomes worse.  You have new symptoms. Get help right away if:  You have nausea or vomiting.  You feel sweaty or light-headed.  You have a cough with phlegm (sputum) or you cough up blood.  You develop shortness of breath. This information is not intended to replace advice given to you by your health care provider. Make sure you discuss any questions you have with your health care provider. Document Released: 02/07/2005 Document Revised: 06/18/2015 Document Reviewed: 05/05/2014 Elsevier Interactive Patient Education  2017 [employer].

## 2017-01-25 NOTE — Progress Notes (Signed)
HPI: Dominique Russell is a 61 y.o. female  who presents to [facility] today, 01/25/17,  for chief complaint of:  Chief Complaint  Patient presents with  . Follow-up    ANXIETY, DEPRESSION, INSOMNIA     Discussed depression issues last visit. We started Trazodone to help with this and with sleep few visits ago. She found it hasn't made too much of a difference, she is reluctant to go up on the dose of the trazodone. She notes that depression/anxiety is not much different, she gets very nervous coming to doctor's appointments.  Chest pains yesterday, sharp on L side, came and went, feeling ok today. Thinks may have been due to raking leaves day prior. No SOB.   She is less concerned today about about lower extremity edema. No history of heart failure, she is not wearing compression stockings. She notes that edema resolves after periods of leg elevation.       Past medical history, surgical history, social history and family history reviewed.  Patient Active Problem List   Diagnosis Date Noted  . Essential hypertension 03/17/2016  . Psoriasis 03/17/2016  . Long-term insulin use in type 2 diabetes ([place]) 02/09/2015  . Morbid obesity with BMI of 45.0-49.9, adult ([place]) 05/16/2014  . DM (diabetes mellitus) ([place]) 01/23/2013  . Low back pain 01/23/2013  . Shoulder pain 01/23/2013  . Bilateral knee pain 12/26/2012  . Bilateral shoulder pain 12/26/2012  . Essential tremor 09/26/2012  . Headache 09/26/2012  . Benign essential tremor 09/26/2012  . Fibromyalgia 03/02/2011  . Generalized osteoarthritis of multiple sites 03/02/2011  . Rheumatoid arthritis ([place]) 03/02/2011    Current medication list and allergy/intolerance information reviewed.   Current Outpatient Medications on File Prior to Visit  Medication Sig Dispense Refill  . acetaminophen (TYLENOL) 650 MG CR tablet Take 650 mg by mouth every 8 (eight) hours as needed for pain.    Marland Kitchen aspirin  (GOODSENSE ASPIRIN) 325 MG tablet Take 325 mg by mouth.    Marland Kitchen atorvastatin (LIPITOR) 20 MG tablet Take 0.5 tablets (10 mg total) by mouth daily. 45 tablet 3  . B-D UF III MINI PEN NEEDLES 31G X 5 MM MISC USE AS DIRECTED WITH INSULIN AND VICTOZA.  11  . Cholecalciferol (VITAMIN D-3) 1000 units CAPS Take by mouth.    . clobetasol ointment (TEMOVATE) 2.42 % Apply 1 application topically 2 (two) times daily. To affected area(s) as needed, max 2-3 weeks to avoid whitening/thinning skin 45 g 1  . diclofenac sodium (VOLTAREN) 1 % GEL Place onto the skin.    . DULoxetine (CYMBALTA) 60 MG capsule Take 60 mg by mouth.    . esomeprazole (NEXIUM) 40 MG capsule Take 40 mg daily at 12 noon by mouth.    . fluticasone (FLONASE) 50 MCG/ACT nasal spray     . folic acid (FOLVITE) 353 MCG tablet Take 400 mcg by mouth daily.    . hydrochlorothiazide (HYDRODIURIL) 25 MG tablet Take 1 tablet (25 mg total) by mouth daily. 90 tablet 1  . insulin aspart protamine- aspart (NOVOLOG MIX 70/30) (70-30) 100 UNIT/ML injection Inject into the skin.    Marland Kitchen liraglutide (VICTOZA) 18 MG/3ML SOPN Inject 1.8 mg into the skin.    Marland Kitchen losartan (COZAAR) 100 MG tablet TAKE 1 TABLET (100 MG TOTAL) BY MOUTH DAILY. 90 tablet 1  . LYRICA 150 MG capsule Take 1 tablet by mouth 3 (three) times daily.    . meloxicam (MOBIC) 15 MG tablet     .  Omega-3 Fatty Acids (FISH OIL) 1000 MG CAPS Take by mouth.    Marland Kitchen omeprazole (PRILOSEC) 20 MG capsule TAKE 1 CAPSULE (20 MG TOTAL) BY MOUTH DAILY. FOR 2 MONTHS 60 capsule 0  . ONE TOUCH ULTRA TEST test strip     . pioglitazone-metformin (ACTOPLUS MET) 15-850 MG per tablet Take 1 tablet by mouth 2 (two) times daily with a meal.    . potassium gluconate 595 MG TABS tablet Take 595 mg by mouth.    . primidone (MYSOLINE) 50 MG tablet Take 1 tablet (50 mg total) by mouth 2 (two) times daily. 180 tablet 4  . propranolol (INDERAL) 20 MG tablet Take 1 tablet (20 mg total) 2 (two) times daily by mouth. 180 tablet 1  .  simethicone (MYLICON) 80 MG chewable tablet Chew 80 mg every 6 (six) hours as needed by mouth for flatulence.    Marland Kitchen tiZANidine (ZANAFLEX) 2 MG tablet TAKE 1 TABLET (2 MG TOTAL) BY MOUTH 3 (THREE) TIMES DAILY AS NEEDED-CAPSULES ARE NOT COVERED. 90 tablet 0  . traZODone (DESYREL) 50 MG tablet TAKE 1 TABLET BY MOUTH EVERYDAY AT BEDTIME 90 tablet 1  . vitamin B-12 (CYANOCOBALAMIN) 1000 MCG tablet Take by mouth.    . vitamin E 400 UNIT capsule Take 400 Units by mouth daily.     No current facility-administered medications on file prior to visit.    No Known Allergies    Review of Systems:  Constitutional: No recent illness  HEENT: No  headache, no vision change  Cardiac: +chest pain as per HPI, No  pressure, No palpitations, +LE edema  Respiratory:  No  shortness of breath. No  Cough  Gastrointestinal: No  abdominal pain,  Musculoskeletal: No new myalgia/arthralgia  Skin: No  Rash,   Neurologic: No  weakness, No  Dizziness  Psychiatric: +concerns with depression, +concerns with anxiety, +insomnia  Exam:  BP 132/81   Pulse 82   Temp 98.6 F (37 C) (Oral)   Resp 16   Ht 5' 4"  (1.626 m)   Wt 250 lb 1.6 oz (113.4 kg)   SpO2 94%   BMI 42.93 kg/m   Constitutional: VS see above. General Appearance: alert, well-developed, well-nourished, NAD  Eyes: Normal lids and conjunctive, non-icteric sclera  Ears, Nose, Mouth, Throat: MMM, Normal external inspection ears/nares/mouth/lips/gums.  Neck: No masses, trachea midline.   Respiratory: Normal respiratory effort. no wheeze, no rhonchi, no rales  Cardiovascular: S1/S2 normal, no murmur, no rub/gallop auscultated. RRR. Trace LE edema  Musculoskeletal: Gait normal. Symmetric and independent movement of all extremities  Neurological: Normal balance/coordination. No tremor.  Skin: warm, dry, intact.   Psychiatric: Normal judgment/insight. Normal mood and affect. Oriented x3.    EKG interpretation: Rate: 73 Rhythm:  sinus Non-specific ST changes, no STEMI No ST/T changes concerning for acute ischemia/infarct  Previous EKG no tracings available.  Stress test w/ nucl med: no inducible ischemia, this was in 2015     ASSESSMENT/PLAN:   Overall stable chronic medical issues, see below for details.   Insomnia: increase trazodone. May hopefully help with depression/anxiety, she is also on Cymbalta.   Patient did not seek medical care yesterday for chest pain since she was not having any left arm pain she was under the impression that it could not be heart attack. She is advised that women and diabetics commonly will have heart attacks with atypical presentation. Any time she might be concerned about cardiac issues, do not wait, seek attention immediately.  Other chest pain -  Seems likely musculoskeletal/spasm. Patient is advised that any chest pain should be evaluated immediately. - Plan: EKG  Lower extremity edema - Likely dependent edema, no rales, advised she needs to be wearing her compression socks  Depression, recurrent (HCC) - On Cymbalta and trazodone. Increasing trazodone for insomnia issues. Consider psychiatry referral - Plan: traZODone (DESYREL) 100 MG tablet  Type 2 diabetes mellitus with complication, with long-term current use of insulin ([place]) - Following with endocrinology, looks like A1c is improved on record review  Insomnia, unspecified type  Depression, recurrent ([place]) - Plan: traZODone (DESYREL) 100 MG tablet       Follow-up plan: Return in about 3 months (around 04/25/2017) for recheck insomnia and blood pressure, sooner if needed.  Visit summary with medication list and pertinent instructions was printed for patient to review, alert [place] if any changes needed. All questions at time of visit were answered - patient instructed to contact office with any additional concerns. ER/RTC precautions were reviewed with the patient and understanding verbalized.   Note: Total time spent 25  minutes, greater than 50% of the visit was spent face-to-face counseling and coordinating care for the following: The primary encounter diagnosis was Other chest pain. Diagnoses of Lower extremity edema, Depression, recurrent ([place]), Type 2 diabetes mellitus with complication, with long-term current use of insulin ([place]), Insomnia, unspecified type, and Depression, recurrent ([place]) were also pertinent to this visit.Marland Kitchen

## 2017-03-03 DIAGNOSIS — Z01411 Encounter for gynecological examination (general) (routine) with abnormal findings: Secondary | ICD-10-CM | POA: Diagnosis not present

## 2017-03-03 DIAGNOSIS — I10 Essential (primary) hypertension: Secondary | ICD-10-CM | POA: Diagnosis not present

## 2017-03-03 DIAGNOSIS — E1149 Type 2 diabetes mellitus with other diabetic neurological complication: Secondary | ICD-10-CM | POA: Diagnosis not present

## 2017-04-04 DIAGNOSIS — M170 Bilateral primary osteoarthritis of knee: Secondary | ICD-10-CM | POA: Diagnosis not present

## 2017-04-04 DIAGNOSIS — G894 Chronic pain syndrome: Secondary | ICD-10-CM | POA: Diagnosis not present

## 2017-04-04 DIAGNOSIS — M19011 Primary osteoarthritis, right shoulder: Secondary | ICD-10-CM | POA: Diagnosis not present

## 2017-04-04 DIAGNOSIS — M797 Fibromyalgia: Secondary | ICD-10-CM | POA: Diagnosis not present

## 2017-04-25 ENCOUNTER — Ambulatory Visit: Payer: Managed Care, Other (non HMO) | Admitting: Osteopathic Medicine

## 2017-04-25 DIAGNOSIS — Z0189 Encounter for other specified special examinations: Secondary | ICD-10-CM

## 2017-05-04 NOTE — Progress Notes (Signed)
Subjective:   Dominique Russell was seen in consultation in the movement disorder clinic at the request of [name], DO.  The evaluation is for tremor.  This patient is accompanied in the office by her granddaughter who supplements the history.  Patient has previously seen Dr. [name] for the same and I have reviewed those records in detail.  Patient last saw him in 2017.  Tremor started in the bilateral upper extremities in approximately 2012.  She was on gabapentin at the time.  Records indicate that she had forgotten to take the medication and she thought that this was the source of the tremor initially.  However, when she got back on that, she still had tremor.  She then was started on primidone.  She got up to 100 mg twice daily of primidone and then Topamax was started in 2015.  Topamax was being used for the treatment of headache and tremor.  Topamax was progressively increased to 100 mg twice per day, but she thought that that, in combination with the primidone was causing memory change and she self decreased both of these medications, so that she ended up on primidone, 50 mg twice per day and stopped the Topamax altogether.  She states that she could just tell that it was the primidone that was messing with her memory.    Tremor is most noticeable when she goes to reach for something, especially a full glass of liquid.   There is no family hx of tremor.    Affected by caffeine:  Doesn't drink enough to know (1 cup coffee per day) Affected by alcohol:  No. (drinks 1-2 times per month) Affected by stress:  No. Affected by fatigue:  No. Spills soup if on spoon:  "it will spill all over if my blood sugar is low" Spills glass of liquid if full:  Yes.   Affects ADL's (tying shoes, brushing teeth, etc):  No.  Current/Previously tried tremor medications: primidone, topamax, gabapentin (was on it for neuropathy); now on lyrica  Current medications that may exacerbate tremor:   N/a  05/05/17 update: Patient is seen today in follow-up.  She is accompanied by her daughter who supplements the history.  Patient is on primidone, 50 mg twice a day and then, last visit propranolol, 20 mg twice per day was added for tremor control.  Today, she reports that she went off of the propranolol because it made tremor worse. She took it for about 3 weeks and then d/c.   She did see her primary care physician on January 25, 2017.  Those records are reviewed.  An increase in her trazodone was recommended to help with depression/anxiety along with her Cymbalta. She does not think that helped with her sleep.  Pt reports that memory is getting worse.  Daughter notes that it is short term memory.      No Known Allergies  Outpatient Encounter Medications as of 05/05/2017  Medication Sig  . acetaminophen (TYLENOL) 650 MG CR tablet Take 650 mg by mouth every 8 (eight) hours as needed for pain.  Marland Kitchen aspirin (GOODSENSE ASPIRIN) 325 MG tablet Take 325 mg by mouth.  Marland Kitchen atorvastatin (LIPITOR) 20 MG tablet Take 0.5 tablets (10 mg total) by mouth daily. (Patient taking differently: Take 20 mg by mouth every other day. )  . B-D UF III MINI PEN NEEDLES 31G X 5 MM MISC USE AS DIRECTED WITH INSULIN AND VICTOZA.  Marland Kitchen Cholecalciferol (VITAMIN D-3) 1000 units CAPS Take by mouth.  Marland Kitchen  clobetasol ointment (TEMOVATE) 0.05 % Apply 1 application topically 2 (two) times daily. To affected area(s) as needed, max 2-3 weeks to avoid whitening/thinning skin  . diclofenac sodium (VOLTAREN) 1 % GEL Place onto the skin.  . fluticasone (FLONASE) 50 MCG/ACT nasal spray   . folic acid (FOLVITE) 800 MCG tablet Take 400 mcg by mouth daily.  . hydrochlorothiazide (HYDRODIURIL) 25 MG tablet Take 1 tablet (25 mg total) by mouth daily.  . insulin aspart protamine- aspart (NOVOLOG MIX 70/30) (70-30) 100 UNIT/ML injection Inject into the skin.  . liraglutide (VICTOZA) 18 MG/3ML SOPN Inject 1.8 mg into the skin.  . losartan (COZAAR) 100 MG  tablet TAKE 1 TABLET (100 MG TOTAL) BY MOUTH DAILY.  . LYRICA 150 MG capsule Take 1 tablet by mouth 2 (two) times daily.   . meloxicam (MOBIC) 15 MG tablet   . Omega-3 Fatty Acids (FISH OIL) 1000 MG CAPS Take by mouth.  . omeprazole (PRILOSEC) 20 MG capsule TAKE 1 CAPSULE (20 MG TOTAL) BY MOUTH DAILY. FOR 2 MONTHS  . ONE TOUCH ULTRA TEST test strip   . pioglitazone-metformin (ACTOPLUS MET) 15-850 MG per tablet Take 1 tablet by mouth 2 (two) times daily with a meal.  . primidone (MYSOLINE) 50 MG tablet Take 1 tablet (50 mg total) by mouth 2 (two) times daily.  . simethicone (MYLICON) 80 MG chewable tablet Chew 80 mg every 6 (six) hours as needed by mouth for flatulence.  . tiZANidine (ZANAFLEX) 2 MG tablet TAKE 1 TABLET (2 MG TOTAL) BY MOUTH 3 (THREE) TIMES DAILY AS NEEDED-CAPSULES ARE NOT COVERED.  . traZODone (DESYREL) 100 MG tablet Take 1 tablet (100 mg total) by mouth at bedtime.  . vitamin B-12 (CYANOCOBALAMIN) 1000 MCG tablet Take by mouth.  . vitamin E 400 UNIT capsule Take 400 Units by mouth daily.  . DULoxetine (CYMBALTA) 60 MG capsule Take 60 mg by mouth.  . [DISCONTINUED] esomeprazole (NEXIUM) 40 MG capsule Take 40 mg daily at 12 noon by mouth.  . [DISCONTINUED] potassium gluconate 595 MG TABS tablet Take 595 mg by mouth.  . [DISCONTINUED] propranolol (INDERAL) 20 MG tablet Take 1 tablet (20 mg total) 2 (two) times daily by mouth.   No facility-administered encounter medications on file as of 05/05/2017.     Past Medical History:  Diagnosis Date  . Diabetes (HCC)   . Essential tremor   . Fibromyalgia   . Hypertension     Past Surgical History:  Procedure Laterality Date  . BREAST REDUCTION SURGERY      Social History   Socioeconomic History  . Marital status: Legally Separated    Spouse name: Not on file  . Number of children: 2  . Years of education: HS  . Highest education level: Not on file  Social Needs  . Financial resource strain: Not on file  . Food  insecurity - worry: Not on file  . Food insecurity - inability: Not on file  . Transportation needs - medical: Not on file  . Transportation needs - non-medical: Not on file  Occupational History  . Not on file  Tobacco Use  . Smoking status: Former Smoker    Last attempt to quit: 02/22/1995    Years since quitting: 22.2  . Smokeless tobacco: Never Used  Substance and Sexual Activity  . Alcohol use: No    Comment: 1-2 times per month  . Drug use: No  . Sexual activity: Not on file  Other Topics Concern  . Not on   file  Social History Narrative   Patient lives at home with her daughter.   Caffeine Use: drinks regurlarly    Family Status  Relation Name Status  . Mother  Deceased at age 42  . Father  Deceased  . Brother  Deceased  . Sister  Deceased       MVA  . Daughter x2 Alive  . Brother  Deceased       drowning  . Neg Hx  (Not Specified)    Review of Systems  A complete 10 system ROS was obtained and was negative apart from what is mentioned.   Objective:   VITALS:   Vitals:   05/05/17 1510  BP: 124/80  Pulse: (!) 104  SpO2: 94%  Weight: 248 lb (112.5 kg)  Height: 5' 4.5" (1.638 m)   Gen:  Appears stated age and in NAD. HEENT:  Normocephalic, atraumatic. The mucous membranes are moist. The superficial temporal arteries are without ropiness or tenderness. Cardiovascular: tachycardic.  Regular.   Lungs: Clear to auscultation bilaterally. Neck: There are no carotid bruits noted bilaterally.  NEUROLOGICAL:  Orientation:  The patient is alert and oriented x 3.   Cranial nerves: There is good facial symmetry.  Speech is fluent and clear. Soft palate rises symmetrically and there is no tongue deviation. Hearing is intact to conversational tone. Tone: Tone is good throughout. Sensation: Sensation is intact to light touch throughout. Coordination:  The patient has no dysdiadichokinesia or dysmetria. Motor: Strength is 5/5 in the upper and lower  extremities.   MOVEMENT EXAM: Tremor:  There is no tremor of the outstretched hands.  She does have intention tremor on the left.  She has trouble with Archimedes spirals, especially on the left.     Assessment/Plan:   1.  Essential Tremor.  -This is evidenced by the symmetrical nature and longstanding hx of gradually getting worse.  We discussed nature and pathophysiology.  We discussed that this can continue to gradually get worse with time.  We discussed that some medications can worsen this, as can caffeine use.  We discussed medication therapy as well as surgical therapy.  The patient felt that propranolol made her tremor worse.  She is on higher doses of primidone caused side effects.  She is currently on 50 mg twice per day.  She will stay on that.  We discussed trihexyphenidyl.  We discussed the risk benefits associated with this, including the risk of cognitive change and dry eyes/dry mouth.  It can also potentially worsen balance issues.  She understands.  We will only start 2 mg in the morning and may need to increase from there. 2.  Memory change  -I saw no evidence of a dementia.  Patient is very worried about this.  I think that medications likely contribute.  She would like to schedule neurocognitive testing.  We will get this scheduled. 3.  Follow up is anticipated in the next few months, sooner should new neurologic issues arise.  CC:  Alexander, Natalie, DO  

## 2017-05-05 ENCOUNTER — Encounter: Payer: Self-pay | Admitting: Neurology

## 2017-05-05 ENCOUNTER — Ambulatory Visit (INDEPENDENT_AMBULATORY_CARE_PROVIDER_SITE_OTHER): Payer: Managed Care, Other (non HMO) | Admitting: Neurology

## 2017-05-05 VITALS — BP 124/80 | HR 104 | Ht 64.5 in | Wt 248.0 lb

## 2017-05-05 DIAGNOSIS — R413 Other amnesia: Secondary | ICD-10-CM | POA: Diagnosis not present

## 2017-05-05 DIAGNOSIS — R251 Tremor, unspecified: Secondary | ICD-10-CM | POA: Diagnosis not present

## 2017-05-05 MED ORDER — TRIHEXYPHENIDYL HCL 2 MG PO TABS: 2.0000 mg | ORAL_TABLET | Freq: Every day | ORAL | 1 refills | Status: DC

## 2017-05-05 MED ORDER — PRIMIDONE 50 MG PO TABS: 50.0000 mg | ORAL_TABLET | Freq: Two times a day (BID) | ORAL | 1 refills | Status: DC

## 2017-05-05 NOTE — Patient Instructions (Signed)
1. You have been referred for a neurocognitive evaluation in our office.   The evaluation consists of three appointments.   1. The first appointment is about 45 minutes and is a clinical interview with the neuropsychologist (Dr. Elvis Coil). You can bring someone with you to this appointment, as it is helpful for Dr. Alinda Dooms to hear from both you and another adult who knows you well.   2. The second appointment is 2-3 hours long and is with the psychometrician Wallace Keller). You will complete a variety of tasks- mostly question-and-answer, some paper-and-pencil, some on the computer. There is nothing you need to do to prepare for this appointment, but having a good night's sleep prior to the testing, and bringing eyeglasses and hearing aids (if you wear them), is advised.   3. The final appointment is a follow-up with Dr. Alinda Dooms where she will go over the test results with you and provide recommendations. This appointment is about 30 minutes.  If you would like a family member to receive this information as well, please bring them to the appointment.   We have to reserve several hours of the neuropsychologist's time and the psychometrician's time for your appointment. As such, please note that there is a No-Show fee of $100. If you are unable to attend any of your appointments, please contact our office as soon as possible to reschedule.

## 2017-05-09 ENCOUNTER — Telehealth: Payer: Self-pay

## 2017-05-09 NOTE — Telephone Encounter (Signed)
[facility] is requesting med refill for omeprazole. According to provider's note, pt was to take medication for 2 mths. Pls advise if refill appropriate. Thanks.

## 2017-05-10 NOTE — Telephone Encounter (Signed)
Would call patient and let her know that this is a medication for short-term treatment of heartburn or cough due to acid reflux and then we typically try to bring patients off of it and see how they do. We'll decline refill for now - would stop the medicine and see me if symptoms come back.

## 2017-05-11 ENCOUNTER — Other Ambulatory Visit: Payer: Self-pay | Admitting: Osteopathic Medicine

## 2017-05-11 ENCOUNTER — Other Ambulatory Visit: Payer: Self-pay

## 2017-05-11 MED ORDER — OMEPRAZOLE 20 MG PO CPDR: 20.0000 mg | DELAYED_RELEASE_CAPSULE | Freq: Every day | ORAL | 0 refills | Status: DC

## 2017-05-11 NOTE — Telephone Encounter (Signed)
OK can refill 90 days w/ 0 refills but we should discuss her symptoms more at next visit

## 2017-05-11 NOTE — Telephone Encounter (Signed)
Med refill completed & sent to [facility]. Pt has been updated.

## 2017-05-11 NOTE — Telephone Encounter (Signed)
As per pt, still having symptoms. She requests to have medication refilled & sent to [facility].

## 2017-05-16 ENCOUNTER — Ambulatory Visit (INDEPENDENT_AMBULATORY_CARE_PROVIDER_SITE_OTHER): Payer: Managed Care, Other (non HMO) | Admitting: Psychology

## 2017-05-16 ENCOUNTER — Encounter: Payer: Self-pay | Admitting: Psychology

## 2017-05-16 DIAGNOSIS — R413 Other amnesia: Secondary | ICD-10-CM

## 2017-05-16 NOTE — Progress Notes (Signed)
NEUROBEHAVIORAL STATUS EXAM   Name: Dominique Russell Date of Birth: 09-Dec-1955 Date of Interview: 05/16/2017  Reason for Referral:  Dominique Russell is a 62 y.o. female who is referred for neuropsychological evaluation by Dr. Wells Guiles Tat of [facility] due to concerns about memory loss. This patient is accompanied in the office by her daughter who supplements the history.  History of Presenting Problem:  [name] has been seeing Dr. Carles Collet for essential tremor since 12/2016; prior to that she was followed by Dr. [name]. At her recent appointment with Dr. Carles Collet she and her daughter expressed concern about short term memory loss, and she was referred for neurocognitive evaluation. She had brain MRI back in 10/2012 which was read as normal.  Today (05/16/2017) the patient reports gradual onset and progressive worsening of memory over the past 3-4 years. Her daughter has seen this as well and agrees with the time frame. They report short term memory difficulties including forgetting details of recent conversations and events as well as repeating herself "all the time". She also misplaces/loses items frequently, confuses when her appointments are, forgets to take medication, has difficulty concentrating, gets distracted when completing tasks, has word finding difficulty or forgets what she wanted to say, sometimes has difficulty understanding other people when they are speaking to her, and is uncertain about directions when she is driving places.   The patient lives with her other daughter in their own home. She drives only occasionally. She manages her medications and appointments has difficulty with this as noted above. Her daughter manages the finances/bills. She doesn't do much cooking because she is apt to leave the stove on. She has been disabled and unemployed for about 30 years.  There is no known family history of dementia.  Physically, she complains of tremor which interferes with her  ability to fix a plate of food, drink beverages from a cup without a lid, and use certain utensils. However, she is able to dress herself and perform self hygiene. She also complains of pain from RA and fibromyalgia. She has difficulty sleeping as a result. She has had some falls, most recently this week in her garage, but has not sustained any injuries including head injury or LOC. The patient has poorly controlled diabetes and notes that she does not follow dietary recommendations, with her blood sugars usually running high.  She reports chronic depression and anxiety through most of her life. She is prescribed Cymbalta. She was prescribed trazodone for sleep in the past year but does not think it is that helpful. She used to see a mental health provider at [facility] and found this helpful; she thinks she should get back to this. She denies history of psychiatric hospitalization, psychosis or suicidal intention or attempt.   Social History: Born/Raised: [place] Education: Graduated from high school but reported she always had great difficulty learning and did very poorly in school Occupational history: On disability for many years Marital history: Married but legally separated Children: 2 adult daughters, 2 granddaughters Alcohol: Minimal/rare (never was a heavy drinker) Tobacco: Former user when she was younger.   Medical History: Past Medical History:  Diagnosis Date  . Diabetes ([place])   . Essential tremor   . Fibromyalgia   . Hypertension      Current Medications:  Outpatient Encounter Medications as of 05/16/2017  Medication Sig  . acetaminophen (TYLENOL) 650 MG CR tablet Take 650 mg by mouth every 8 (eight) hours as needed for pain.  Marland Kitchen  aspirin (GOODSENSE ASPIRIN) 325 MG tablet Take 325 mg by mouth.  Marland Kitchen atorvastatin (LIPITOR) 20 MG tablet Take 0.5 tablets (10 mg total) by mouth daily. (Patient taking differently: Take 20 mg by mouth every other day. )  . B-D UF III MINI PEN NEEDLES 31G X  5 MM MISC USE AS DIRECTED WITH INSULIN AND VICTOZA.  Marland Kitchen Cholecalciferol (VITAMIN D-3) 1000 units CAPS Take by mouth.  . clobetasol ointment (TEMOVATE) 8.24 % Apply 1 application topically 2 (two) times daily. To affected area(s) as needed, max 2-3 weeks to avoid whitening/thinning skin  . diclofenac sodium (VOLTAREN) 1 % GEL Place onto the skin.  . DULoxetine (CYMBALTA) 60 MG capsule Take 60 mg by mouth.  . fluticasone (FLONASE) 50 MCG/ACT nasal spray   . folic acid (FOLVITE) 235 MCG tablet Take 400 mcg by mouth daily.  . hydrochlorothiazide (HYDRODIURIL) 25 MG tablet Take 1 tablet (25 mg total) by mouth daily.  . insulin aspart protamine- aspart (NOVOLOG MIX 70/30) (70-30) 100 UNIT/ML injection Inject into the skin.  Marland Kitchen liraglutide (VICTOZA) 18 MG/3ML SOPN Inject 1.8 mg into the skin.  Marland Kitchen losartan (COZAAR) 100 MG tablet TAKE 1 TABLET (100 MG TOTAL) BY MOUTH DAILY.  Marland Kitchen LYRICA 150 MG capsule Take 1 tablet by mouth 2 (two) times daily.   . meloxicam (MOBIC) 15 MG tablet   . Omega-3 Fatty Acids (FISH OIL) 1000 MG CAPS Take by mouth.  Marland Kitchen omeprazole (PRILOSEC) 20 MG capsule Take 1 capsule (20 mg total) by mouth daily. Last refill. Pt must make an appt w/PCP if symptoms continue.  . ONE TOUCH ULTRA TEST test strip   . pioglitazone-metformin (ACTOPLUS MET) 15-850 MG per tablet Take 1 tablet by mouth 2 (two) times daily with a meal.  . primidone (MYSOLINE) 50 MG tablet Take 1 tablet (50 mg total) by mouth 2 (two) times daily.  . simethicone (MYLICON) 80 MG chewable tablet Chew 80 mg every 6 (six) hours as needed by mouth for flatulence.  Marland Kitchen tiZANidine (ZANAFLEX) 2 MG tablet TAKE 1 TABLET (2 MG TOTAL) BY MOUTH 3 (THREE) TIMES DAILY AS NEEDED-CAPSULES ARE NOT COVERED.  Marland Kitchen traZODone (DESYREL) 100 MG tablet Take 1 tablet (100 mg total) by mouth at bedtime.  . trihexyphenidyl (ARTANE) 2 MG tablet Take 1 tablet (2 mg total) by mouth daily.  . vitamin B-12 (CYANOCOBALAMIN) 1000 MCG tablet Take by mouth.  . vitamin  E 400 UNIT capsule Take 400 Units by mouth daily.   No facility-administered encounter medications on file as of 05/16/2017.      Behavioral Observations:   Appearance: Neatly and appropriately dressed and groomed Gait: Ambulated independently with some difficulty, no gross abnormalities observed Speech: Fluent; normal rate, rhythm and volume.  Thought process: Linear, goal directed Affect: Full, anxious Interpersonal: Pleasant, appropriate   30 minutes spent face-to-face with patient completing neurobehavioral status exam. 25 minutes spent integrating medical records/clinical data and completing this report. CPT code [id number].   TESTING: There is medical necessity to proceed with neuropsychological assessment as the results will be used to aid in differential diagnosis and clinical decision-making and to inform specific treatment recommendations. Per the patient, her daughter and medical records reviewed, there has been a change in cognitive functioning and a reasonable suspicion of neurocognitive disorder (rule out vascular cognitive impairment, rule out cognitive symptoms due to underlying depression/anxiety/pain, rule out cognitive symptoms due to medication effects).  Clinical Decision Making: In considering the patient's current level of functioning, level of presumed impairment, nature of  symptoms, emotional and behavioral responses during the interview, level of literacy, and observed level of motivation, a battery of tests was selected and communicated to the psychometrician.    PLAN: The patient will return tomorrow to complete the above referenced full battery of neuropsychological testing with a psychometrician under my supervision. Education regarding testing procedures was provided to the patient. Subsequently, the patient will see this provider for a follow-up session at which time her test performances and my impressions and treatment recommendations will be reviewed in  detail.  Evaluation ongoing; full report to follow.

## 2017-05-17 ENCOUNTER — Ambulatory Visit (INDEPENDENT_AMBULATORY_CARE_PROVIDER_SITE_OTHER): Payer: Managed Care, Other (non HMO) | Admitting: Psychology

## 2017-05-17 DIAGNOSIS — R413 Other amnesia: Secondary | ICD-10-CM

## 2017-05-17 NOTE — Progress Notes (Signed)
   Neuropsychology Note  Dominique Russell completed 90 minutes of neuropsychological testing with technician, Wallace Keller, BS, under the supervision of Dr. Elvis Coil, Licensed Psychologist. The patient did not appear overtly distressed by the testing session, per behavioral observation or via self-report to the technician. Rest breaks were offered.   Clinical Decision Making: In considering the patient's current level of functioning, level of presumed impairment, nature of symptoms, emotional and behavioral responses during the interview, level of literacy, and observed level of motivation/effort, a battery of tests was selected and communicated to the psychometrician.  Communication between the psychologist and technician was ongoing throughout the testing session and changes were made as deemed necessary based on patient performance on testing, technician observations and additional pertinent factors such as those listed above.  Dominique Russell will return within approximately 2 weeks for an interactive feedback session with Dr. Alinda Dooms at which time her test performances, clinical impressions and treatment recommendations will be reviewed in detail. The patient understands she can contact our office should she require our assistance before this time.  35 minutes spent performing neuropsychological evaluation services/clinical decision making (psychologist). [CPT 96132] 90 minutes spent face-to-face with patient administering standardized tests, 30 minutes spent scoring (technician). [CPT [id number], 96139]  Full report to follow.

## 2017-05-31 ENCOUNTER — Telehealth: Payer: Self-pay | Admitting: Neurology

## 2017-05-31 DIAGNOSIS — M797 Fibromyalgia: Secondary | ICD-10-CM | POA: Diagnosis not present

## 2017-05-31 DIAGNOSIS — M170 Bilateral primary osteoarthritis of knee: Secondary | ICD-10-CM | POA: Diagnosis not present

## 2017-05-31 DIAGNOSIS — G894 Chronic pain syndrome: Secondary | ICD-10-CM | POA: Diagnosis not present

## 2017-05-31 DIAGNOSIS — M19011 Primary osteoarthritis, right shoulder: Secondary | ICD-10-CM | POA: Diagnosis not present

## 2017-05-31 NOTE — Telephone Encounter (Signed)
Called patient to see how she is doing on trihexyphenidyl. No answer. VM full. Will try again later. To see how tremor is doing and see if any  cognitive change, balance issues, or dry eyes/dry mouth.She should be on 2 mg in the morning.

## 2017-06-01 ENCOUNTER — Encounter: Payer: Self-pay | Admitting: Psychology

## 2017-06-01 ENCOUNTER — Ambulatory Visit (INDEPENDENT_AMBULATORY_CARE_PROVIDER_SITE_OTHER): Payer: Managed Care, Other (non HMO) | Admitting: Psychology

## 2017-06-01 DIAGNOSIS — F0150 Vascular dementia without behavioral disturbance: Secondary | ICD-10-CM

## 2017-06-01 DIAGNOSIS — R413 Other amnesia: Secondary | ICD-10-CM

## 2017-06-01 NOTE — Progress Notes (Signed)
NEUROPSYCHOLOGICAL EVALUATION   Name:    Dominique Russell  Date of Birth:   05-Feb-1956 Date of Interview:  05/16/2017 Date of Testing:  05/17/2017   Date of Feedback:  06/01/2017       Background Information:  Reason for Referral:  Dominique Russell is a 62 y.o. female referred by Dr. Wells Guiles Tat to assess her current level of cognitive functioning and assist in differential diagnosis. The current evaluation consisted of a review of available medical records, an interview with the patient and her daughter, and the completion of a neuropsychological testing battery. Informed consent was obtained.  History of Presenting Problem:  Dominique Russell has been seeing Dr. Carles Collet for essential tremor since 12/2016; prior to that she was followed by Dr. [name]. At her recent appointment with Dr. Carles Collet she and her daughter expressed concern about short term memory loss, and she was referred for neurocognitive evaluation. She had brain MRI back in 10/2012 which was read as normal.  Today (05/16/2017) the patient reports gradual onset and progressive worsening of memory over the past 3-4 years. Her daughter has seen this as well and agrees with the time frame. They report short term memory difficulties including forgetting details of recent conversations and events as well as repeating herself "all the time". She also misplaces/loses items frequently, confuses when her appointments are, forgets to take medication, has difficulty concentrating, gets distracted when completing tasks, has word finding difficulty or forgets what she wanted to say, sometimes has difficulty understanding other people when they are speaking to her, and is uncertain about directions when she is driving places.   The patient lives with her other daughter in their own home. She drives only occasionally. She manages her medications and appointments has difficulty with this as noted above. Her daughter manages the finances/bills. She doesn't do  much cooking because she is apt to leave the stove on. She has been disabled and unemployed for about 30 years.  There is no known family history of dementia.  Physically, she complains of tremor which interferes with her ability to fix a plate of food, drink beverages from a cup without a lid, and use certain utensils. However, she is able to dress herself and perform self hygiene. She also complains of pain from RA and fibromyalgia. She has difficulty sleeping as a result. She has had some falls, most recently this week in her garage, but has not sustained any injuries including head injury or LOC. The patient has poorly controlled diabetes and notes that she does not follow dietary recommendations, with her blood sugars usually running high.  She reports chronic depression and anxiety through most of her life. She is prescribed Cymbalta. She was prescribed trazodone for sleep in the past year but does not think it is that helpful. She used to see a mental health provider at [facility] and found this helpful; she thinks she should get back to this. She denies history of psychiatric hospitalization, psychosis or suicidal intention or attempt.   Social History: Born/Raised: [place] Education: Graduated from high school but reported she always had great difficulty learning and did very poorly in school Occupational history: On disability for many years Marital history: Married but legally separated Children: 2 adult daughters, 2 granddaughters Alcohol: Minimal/rare (never was a heavy drinker) Tobacco: Former user when she was younger.   Medical History:  Past Medical History:  Diagnosis Date  . Diabetes ([place])   . Essential tremor   . Fibromyalgia   .  Hypertension     Current medications:  Outpatient Encounter Medications as of 06/01/2017  Medication Sig  . acetaminophen (TYLENOL) 650 MG CR tablet Take 650 mg by mouth every 8 (eight) hours as needed for pain.  Marland Kitchen aspirin (GOODSENSE ASPIRIN)  325 MG tablet Take 325 mg by mouth.  Marland Kitchen atorvastatin (LIPITOR) 20 MG tablet Take 0.5 tablets (10 mg total) by mouth daily. (Patient taking differently: Take 20 mg by mouth every other day. )  . B-D UF III MINI PEN NEEDLES 31G X 5 MM MISC USE AS DIRECTED WITH INSULIN AND VICTOZA.  Marland Kitchen Cholecalciferol (VITAMIN D-3) 1000 units CAPS Take by mouth.  . clobetasol ointment (TEMOVATE) 7.61 % Apply 1 application topically 2 (two) times daily. To affected area(s) as needed, max 2-3 weeks to avoid whitening/thinning skin  . diclofenac sodium (VOLTAREN) 1 % GEL Place onto the skin.  . DULoxetine (CYMBALTA) 60 MG capsule Take 60 mg by mouth.  . fluticasone (FLONASE) 50 MCG/ACT nasal spray   . folic acid (FOLVITE) 950 MCG tablet Take 400 mcg by mouth daily.  . hydrochlorothiazide (HYDRODIURIL) 25 MG tablet Take 1 tablet (25 mg total) by mouth daily.  . insulin aspart protamine- aspart (NOVOLOG MIX 70/30) (70-30) 100 UNIT/ML injection Inject into the skin.  Marland Kitchen liraglutide (VICTOZA) 18 MG/3ML SOPN Inject 1.8 mg into the skin.  Marland Kitchen losartan (COZAAR) 100 MG tablet TAKE 1 TABLET (100 MG TOTAL) BY MOUTH DAILY.  Marland Kitchen LYRICA 150 MG capsule Take 1 tablet by mouth 2 (two) times daily.   . meloxicam (MOBIC) 15 MG tablet   . Omega-3 Fatty Acids (FISH OIL) 1000 MG CAPS Take by mouth.  Marland Kitchen omeprazole (PRILOSEC) 20 MG capsule Take 1 capsule (20 mg total) by mouth daily. Last refill. Pt must make an appt w/PCP if symptoms continue.  . ONE TOUCH ULTRA TEST test strip   . pioglitazone-metformin (ACTOPLUS MET) 15-850 MG per tablet Take 1 tablet by mouth 2 (two) times daily with a meal.  . primidone (MYSOLINE) 50 MG tablet Take 1 tablet (50 mg total) by mouth 2 (two) times daily.  . simethicone (MYLICON) 80 MG chewable tablet Chew 80 mg every 6 (six) hours as needed by mouth for flatulence.  Marland Kitchen tiZANidine (ZANAFLEX) 2 MG tablet TAKE 1 TABLET (2 MG TOTAL) BY MOUTH 3 (THREE) TIMES DAILY AS NEEDED-CAPSULES ARE NOT COVERED.  Marland Kitchen traZODone  (DESYREL) 100 MG tablet Take 1 tablet (100 mg total) by mouth at bedtime.  . trihexyphenidyl (ARTANE) 2 MG tablet Take 1 tablet (2 mg total) by mouth daily.  . vitamin B-12 (CYANOCOBALAMIN) 1000 MCG tablet Take by mouth.  . vitamin E 400 UNIT capsule Take 400 Units by mouth daily.   No facility-administered encounter medications on file as of 06/01/2017.      Current Examination:  Behavioral Observations:  Appearance: Neatly and appropriately dressed and groomed Gait: Ambulated independently with some difficulty, no gross abnormalities observed Speech: Fluent; normal rate, rhythm and volume.  Thought process: Linear, goal directed Affect: Full, anxious Interpersonal: Pleasant, appropriate Orientation: Oriented to person, place, and current month and year. Disoriented to current day of the week (one day off) and date. Accurately named the current President and his predecessor.   Tests Administered: . Test of Premorbid Functioning (TOPF) . Wechsler Adult Intelligence Scale-Fourth Edition (WAIS-IV): Matrix Reasoning, Similarities, Block Design, Information, and Digit Span subtests . Wechsler Memory Scale-Fourth Edition (WMS-IV) Adult Version (ages 78-69): Logical Memory I, II and Recognition subtests  . [place] Verbal Learning  Test - 2nd Edition (CVLT-2) Short Form . Repeatable Battery for the Assessment of Neuropsychological Status (RBANS) Form A:   Systems developer . Boston Naming Test (BNT) . Boston Diagnostic Aphasia Examination: Complex Ideational Material subtest . Controlled Oral Word Association Test (COWAT) . Trail Making Test A and B - Oral . Clock drawing test . Symbol Digit Modalities Test (SDMT) . Beck Depression Inventory - 2nd Edition (BDI-II) . Generalized Anxiety Disorder - 7 item screener (GAD-7) . Parkinson's Disease Questionnaire for tremor (PDQ-39)  Test Results: Note: Standardized scores are presented only for use by appropriately trained  professionals and to allow for any future test-retest comparison. These scores should not be interpreted without consideration of all the information that is contained in the rest of the report. The most recent standardization samples from the test publisher or other sources were used whenever possible to derive standard scores; scores were corrected for age, gender, ethnicity and education when available.   Test Scores:   Test Name Raw Score Standardized Score Descriptor  TOPF 18/70 SS= 77 Borderline  WAIS-IV Subtests     Matrix Reasoning 10/26 ss= 7 Low average  Similarities 12/36 ss= 4 Impaired  Block Design 16/66 ss= 5 Borderline  Information 13/26 ss= 9 Average  Digit Span  16/48 ss= 5 Borderline  WAIS-IV Index Scores     Verbal Comprehension  SS= 81 Low average  Perceptual Reasoning  SS= 77 Borderline  WMS-IV Subtests     LM I 14/50 ss= 5 Borderline  LM II 7/50 ss= 4 Impaired  LM II Recognition 22/30 Cum %: 17-25   RBANS Subtest     Semantic Fluency 14/40 Z= -1.5 Borderline  CVLT-II Scores     Trial 1 4/9 Z= -1 Low average  Trial 4 7/9 Z= -1 Low average  Trials 1-4 total 23/36 T= 39 Low average  SD Free Recall 7/9 Z= 0 Average  LD Free Recall 5/9 Z= -1 Low average  LD Cued Recall 5/9 Z= -1.5 Borderline  Recognition Discriminability 9/9 hits, 0 false positives Z= 0.5 Average  Forced Choice Recognition 9/9  WNL  BNT 51/60 T= 37 Low average  BDAE Subtest     Complex Ideational Material 6/6  WNL  COWAT-FAS 18 T= 36 Borderline  COWAT-Animals 13 T= 42 Low average  Trail Making Test A - Oral 14" 0 errors    Trail Making Test B - Oral 224" errors T= 13 Severely impaired  Clock Drawing   Impaired  SDMT     Written 18/110 Z= -3.1 Severely impaired  Oral 13/110 Z= -4 Severely impaired   BDI-II 42/63  Severe  GAD-7 19/21  Severe  PDQ-39     Mobility 75%    Activities of Daily Living 75%    Emotional Well Being 79.16%    Stigma 68.75%    Social Support 33.33%    Cognitive  Impairment 75%    Communication 41.66%    Bodily Discomfort 75%        Description of Test Results:  Premorbid verbal intellectual abilities were estimated to have been within the borderline range based on a test of word reading. Psychomotor processing speed was severely impaired. When the motor component was removed, performance did not improve and actually was worse than written. Auditory attention and working memory were borderline impaired. Visual-spatial construction was low average and influenced by tremor. Language abilities were within normal limits. Specifically, confrontation naming was low average, and semantic verbal fluency ranged from low average for  animals to borderline for fruits/vegetables. Auditory comprehension of complex ideational material was intact. With regard to verbal memory, encoding and acquisition of non-contextual information (i.e., word list) was low average. After a brief distracter task, free recall was average (7/9 items). After a delay, free recall was low average (5/9 items). She did not benefit from semantic cueing (borderline). Performance on a yes/no recognition task was intact with 100% accuracy. On another verbal memory test, encoding and acquisition of contextual auditory information (i.e., short stories) was borderline. After a delay, free recall was impaired. Performance on a yes/no recognition task was below average. Non-verbal memory was not assessed due to her inability to write/draw. Executive functioning was below expectation overall. Mental flexibility and set-shifting were severely impaired on the oral version of Trails B. Verbal fluency with phonemic search restrictions was borderline impaired. Verbal abstract reasoning was impaired. Non-verbal abstract reasoning was low average. Performance on a modified clock drawing task (clock was drawn for her and she just had to indicate where the hour hand and where the minute hand should be placed for "ten after  eleven") was impaired. On a self-report measure of mood, the patient's responses were indicative of severe, clinically significant depression at the present time. Symptoms endorsed included: sadness, hopelessness, feelings of failure, anhedonia, guilty feelings, punishment feelings, self-disappointment, tearfulness, restlessness, loss of interest, indecisiveness, worthlessness, loss of energy, sleep disturbance, irritability, reduced appetite, concentration difficulty, fatigue and loss of libido. She endorsed passive suicidal ideation but denied intention or plan. On a self-report measure of anxiety, the patient endorsed severe, clinically significant generalized anxiety characterized by nervousness, inability to control worrying, excessive worrying, difficulty relaxing, irritability and fear of something awful happening. The patient was also administered a self-report questionnaire assessing the impact of tremor on her quality of life and daily functioning. On this measure, she endorsed significant problems with mobility, ADLs, emotional wellbeing, stigma, cognitive impairment and bodily discomfort. She endorsed some problems with communication. She did not endorse significant problems with, or lack of, social support.   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 Worsened by chronic depression/anxiety, chronic pain and sleep difficulty. Even in the context of estimated low average baseline, current test results reveal deficits in mental processing speed and executive functioning. Additionally, she demonstrated difficulty encoding new information, but this improved significantly when information was repeated, suggesting disruption of frontal-subcortical networks rather than hippocampal consolidation dysfunction. There is evidence that the patient's cognitive deficits are interfering with her ability to manage complex tasks in daily life (medications,  appointments, cooking). As such, diagnostic criteria for a dementia syndrome are met. I would characterize her dementia as mild stage at the present time. Her cognitive profile does NOT suggest Alzheimer's disease as there is no evidence of hippocampal consolidation dysfunction or prominent semantic retrieval deficits. Instead, her profile reflects frontal-subcortical dysfunction. She reports poorly controlled diabetes and I suspect a vascular etiology of dementia. Of course we would need an updated MRI to assess for vascular load and to be more confident in this diagnosis.  I do think underlying cognitive deficits are exacerbated by severe depression and anxiety. Psychiatry and psychology intervention is indicated. Additionally, poor sleep and chronic pain are likely contributing to cognitive problems as well.    Recommendations/Plan: Based on the findings of the present evaluation, the following recommendations are offered:  1. Optimal control of vascular risk factors (eg diabetes) is necessary to reduce the risk of further cognitive decline. I educated the patient  and her daughter on the impact of poorly controlled diabetes on brain health and provided written information/recommendations.  2. Psychiatry evaluation and management is indicated, as well as individual counseling, for depression and anxiety. She requested a referral to [facility] and I will happily place this for her. 3. She should continue to have assistance with complex ADLs such as finances/bill paying, appointments and medications. She should also continue to limit her driving to familiar/local areas in low traffic during the day. 4. If a worsening of cognitive function is reported or observed in the future, neuropsychological re-evaluation could be performed.   Feedback to Patient: JOLAYNE BRANSON and her daughter returned for a feedback appointment on 06/01/2017 to review the results of her neuropsychological  evaluation with this provider. 20 minutes face-to-face time was spent reviewing her test results, my impressions and my recommendations as detailed above.    Total time spent on this patient's case: 55 minutes for neurobehavioral status exam with psychologist (CPT code [phone]); 120 minutes of testing/scoring by psychometrician under psychologist's supervision (CPT codes [phone], [phone] units); 190 minutes for integration of patient data, interpretation of standardized test results and clinical data, clinical decision making, treatment planning and preparation of this report, and interactive feedback with review of results to the patient/family by psychologist (CPT codes [phone], [phone] units).      Thank you for your referral of [facility]. Please feel free to contact me if you have any questions or concerns regarding this report.

## 2017-06-02 NOTE — Telephone Encounter (Signed)
Left message on machine for patient to call back.

## 2017-06-21 ENCOUNTER — Ambulatory Visit (INDEPENDENT_AMBULATORY_CARE_PROVIDER_SITE_OTHER): Payer: Managed Care, Other (non HMO) | Admitting: Osteopathic Medicine

## 2017-06-21 ENCOUNTER — Encounter: Payer: Self-pay | Admitting: Osteopathic Medicine

## 2017-06-21 VITALS — BP 138/85 | HR 86 | Temp 98.4°F | Wt 240.7 lb

## 2017-06-21 DIAGNOSIS — H60542 Acute eczematoid otitis externa, left ear: Secondary | ICD-10-CM | POA: Diagnosis not present

## 2017-06-21 DIAGNOSIS — R1030 Lower abdominal pain, unspecified: Secondary | ICD-10-CM | POA: Diagnosis not present

## 2017-06-21 LAB — POCT URINALYSIS DIPSTICK
Bilirubin, UA: NEGATIVE
Blood, UA: NEGATIVE
Glucose, UA: NEGATIVE
Ketones, UA: NEGATIVE
LEUKOCYTES UA: NEGATIVE
NITRITE UA: NEGATIVE
PH UA: 6.5 (ref 5.0–8.0)
PROTEIN UA: NEGATIVE
Spec Grav, UA: 1.025 (ref 1.010–1.025)
UROBILINOGEN UA: 0.2 U/dL

## 2017-06-21 MED ORDER — FLUOCINOLONE ACETONIDE 0.01 % OT OIL: 5.0000 [drp] | TOPICAL_OIL | Freq: Two times a day (BID) | OTIC | 1 refills | Status: DC | PRN

## 2017-06-21 MED ORDER — OMEPRAZOLE 40 MG PO CPDR: 40.0000 mg | DELAYED_RELEASE_CAPSULE | Freq: Every day | ORAL | 1 refills | Status: DC

## 2017-06-21 NOTE — Progress Notes (Signed)
HPI: Dominique Russell is a 62 y.o. female who  has a past medical history of Diabetes ([place]), Essential tremor, Fibromyalgia, and Hypertension.  she presents to [facility] today, 06/21/17,  for chief complaint of:  Ear pain Abdominal pain   Ear pain: L ear, 3 weeks. Has been using Qtips to try to clean it.   Abdominal pain: also several weeks, on and off, no triggers indentified. Lower middle abdomen. "burning pain" no sharp pain or dull pain. Mylants helps but then it comes back     Past medical history, surgical history, and family history reviewed.  Current medication list and allergy/intolerance information reviewed.   (See remainder of HPI, as well as ROS, PE below)    Results for orders placed or performed in visit on 06/21/17 (from the past 72 hour(s))  POCT Urinalysis Dipstick     Status: None   Collection Time: 06/21/17  2:30 PM  Result Value Ref Range   Color, UA yellow    Clarity, UA clear    Glucose, UA negative    Bilirubin, UA negative    Ketones, UA negative    Spec Grav, UA 1.025 1.010 - 1.025   Blood, UA negative    pH, UA 6.5 5.0 - 8.0   Protein, UA negative    Urobilinogen, UA 0.2 0.2 or 1.0 E.U./dL   Nitrite, UA negative    Leukocytes, UA Negative Negative   Appearance     Odor       ASSESSMENT/PLAN:   Eczema of left external ear - advised leave the ear alone, it's dry and no wax, this may acutally be irritating to your skin   Lower abdominal pain - not sure what to make of "burning" lower abd pain in absence of other symptoms, bowel movement change, etc. Maalox helps, let's try increasing PPI, rtc prn - Plan: POCT Urinalysis Dipstick   Meds ordered this encounter  Medications  . Fluocinolone Acetonide 0.01 % OIL    Sig: Place 5 drops in ear(s) 2 (two) times daily as needed. For itching ear up to 2 weeks    Dispense:  20 mL    Refill:  1  . omeprazole (PRILOSEC) 40 MG capsule    Sig: Take 1 capsule (40 mg  total) by mouth daily. Last refill. Pt must make an appt w/PCP if symptoms continue.    Dispense:  90 capsule    Refill:  1    WOULD LIKE MORE REFILLS PLEASE    Patient Instructions  Plan:  Will try steroid drops for the ear. Please do not use Qtips! Just leave the ear alone as much as possible. If symptoms persist, we should get an opinion from an ENT specialist.   Will try increasing antacid medication from 20 mg to 40 mg and see if this helps abdominal pain. If symptoms persist, come see me again for further evaluation.    Follow-up plan: Return in about 1 month (around 07/19/2017) for recheck abdominal pain 2-4 weeks if still bothering you, otherwise 6 months for routine check-up .      ##################### ##################### #####################   Outpatient Encounter Medications as of 06/21/2017  Medication Sig  . acetaminophen (TYLENOL) 650 MG CR tablet Take 650 mg by mouth every 8 (eight) hours as needed for pain.  Marland Kitchen aspirin (GOODSENSE ASPIRIN) 325 MG tablet Take 325 mg by mouth.  Marland Kitchen atorvastatin (LIPITOR) 20 MG tablet Take 0.5 tablets (10 mg total) by mouth daily. (Patient  taking differently: Take 20 mg by mouth every other day. )  . B-D UF III MINI PEN NEEDLES 31G X 5 MM MISC USE AS DIRECTED WITH INSULIN AND VICTOZA.  Marland Kitchen Cholecalciferol (VITAMIN D-3) 1000 units CAPS Take by mouth.  . clobetasol ointment (TEMOVATE) 2.48 % Apply 1 application topically 2 (two) times daily. To affected area(s) as needed, max 2-3 weeks to avoid whitening/thinning skin  . diclofenac sodium (VOLTAREN) 1 % GEL Place onto the skin.  . fluticasone (FLONASE) 50 MCG/ACT nasal spray   . folic acid (FOLVITE) 185 MCG tablet Take 400 mcg by mouth daily.  . hydrochlorothiazide (HYDRODIURIL) 25 MG tablet Take 1 tablet (25 mg total) by mouth daily.  . insulin aspart protamine- aspart (NOVOLOG MIX 70/30) (70-30) 100 UNIT/ML injection Inject into the skin.  Marland Kitchen liraglutide (VICTOZA) 18 MG/3ML SOPN Inject  1.8 mg into the skin.  Marland Kitchen losartan (COZAAR) 100 MG tablet TAKE 1 TABLET (100 MG TOTAL) BY MOUTH DAILY.  Marland Kitchen LYRICA 150 MG capsule Take 1 tablet by mouth 2 (two) times daily.   . meloxicam (MOBIC) 15 MG tablet   . Omega-3 Fatty Acids (FISH OIL) 1000 MG CAPS Take by mouth.  Marland Kitchen omeprazole (PRILOSEC) 20 MG capsule Take 1 capsule (20 mg total) by mouth daily. Last refill. Pt must make an appt w/PCP if symptoms continue.  . ONE TOUCH ULTRA TEST test strip   . pioglitazone-metformin (ACTOPLUS MET) 15-850 MG per tablet Take 1 tablet by mouth 2 (two) times daily with a meal.  . primidone (MYSOLINE) 50 MG tablet Take 1 tablet (50 mg total) by mouth 2 (two) times daily.  . simethicone (MYLICON) 80 MG chewable tablet Chew 80 mg every 6 (six) hours as needed by mouth for flatulence.  Marland Kitchen tiZANidine (ZANAFLEX) 2 MG tablet TAKE 1 TABLET (2 MG TOTAL) BY MOUTH 3 (THREE) TIMES DAILY AS NEEDED-CAPSULES ARE NOT COVERED.  Marland Kitchen traZODone (DESYREL) 100 MG tablet Take 1 tablet (100 mg total) by mouth at bedtime.  . trihexyphenidyl (ARTANE) 2 MG tablet Take 1 tablet (2 mg total) by mouth daily.  . vitamin B-12 (CYANOCOBALAMIN) 1000 MCG tablet Take by mouth.  . vitamin E 400 UNIT capsule Take 400 Units by mouth daily.  . DULoxetine (CYMBALTA) 60 MG capsule Take 60 mg by mouth.   No facility-administered encounter medications on file as of 06/21/2017.    No Known Allergies    Review of Systems:  Constitutional: No recent illness  HEENT: No  headache, no vision change  Cardiac: No  chest pain, No  pressure, No palpitations  Respiratory:  No  shortness of breath. No  Cough  Gastrointestinal: +abdominal pain, no change on bowel habits  Musculoskeletal: No new myalgia/arthralgia  Skin: No  Rash  Neurologic: No  weakness, No  Dizziness  Psychiatric: No  concerns with depression, No  concerns with anxiety  Exam:  BP 138/85 (BP Location: Right Arm, Patient Position: Sitting, Cuff Size: Large)   Pulse 86   Temp 98.4  F (36.9 C) (Oral)   Wt 240 lb 11.2 oz (109.2 kg)   BMI 40.68 kg/m   Constitutional: VS see above. General Appearance: alert, well-developed, well-nourished, NAD  Eyes: Normal lids and conjunctive, non-icteric sclera  Ears, Nose, Mouth, Throat: MMM, Normal external inspection ears/nares/mouth/lips/gums. TM normal bilaterally, almost no earwax, no excoriation, very mild scaling around the L canal distally.   Neck: No masses, trachea midline.   Respiratory: Normal respiratory effort. no wheeze, no rhonchi, no rales  Cardiovascular:  S1/S2 normal, no murmur, no rub/gallop auscultated. RRR.   Musculoskeletal: Gait normal. Symmetric and independent movement of all extremities  Abdominal: nontender, nondistended. BS wnl x4. Habitus limits exam but no organomegaly appreciated.   Neurological: Normal balance/coordination. No tremor.  Skin: warm, dry, intact.   Psychiatric: Normal judgment/insight. Normal mood and affect. Oriented x3.   Visit summary with medication list and pertinent instructions was printed for patient to review, alert [place] if any changes needed. All questions at time of visit were answered - patient instructed to contact office with any additional concerns. ER/RTC precautions were reviewed with the patient and understanding verbalized.   Follow-up plan: Return in about 1 month (around 07/19/2017) for recheck abdominal pain 2-4 weeks if still bothering you, otherwise 6 months for routine check-up .    Please note: voice recognition software was used to produce this document, and typos may escape review. Please contact Dr. Sheppard Coil for any needed clarifications.

## 2017-06-21 NOTE — Patient Instructions (Signed)
Plan:  Will try steroid drops for the ear. Please do not use Qtips! Just leave the ear alone as much as possible. If symptoms persist, we should get an opinion from an ENT specialist.   Will try increasing antacid medication from 20 mg to 40 mg and see if this helps abdominal pain. If symptoms persist, come see me again for further evaluation.

## 2017-07-08 ENCOUNTER — Other Ambulatory Visit: Payer: Self-pay | Admitting: Osteopathic Medicine

## 2017-07-19 DIAGNOSIS — M797 Fibromyalgia: Secondary | ICD-10-CM | POA: Diagnosis not present

## 2017-07-19 DIAGNOSIS — M170 Bilateral primary osteoarthritis of knee: Secondary | ICD-10-CM | POA: Diagnosis not present

## 2017-07-19 DIAGNOSIS — M19011 Primary osteoarthritis, right shoulder: Secondary | ICD-10-CM | POA: Diagnosis not present

## 2017-07-19 DIAGNOSIS — G894 Chronic pain syndrome: Secondary | ICD-10-CM | POA: Diagnosis not present

## 2017-07-25 DIAGNOSIS — Z794 Long term (current) use of insulin: Secondary | ICD-10-CM | POA: Diagnosis not present

## 2017-07-25 DIAGNOSIS — E1169 Type 2 diabetes mellitus with other specified complication: Secondary | ICD-10-CM | POA: Diagnosis not present

## 2017-07-26 DIAGNOSIS — H524 Presbyopia: Secondary | ICD-10-CM | POA: Diagnosis not present

## 2017-07-26 DIAGNOSIS — H43392 Other vitreous opacities, left eye: Secondary | ICD-10-CM | POA: Diagnosis not present

## 2017-07-26 DIAGNOSIS — E119 Type 2 diabetes mellitus without complications: Secondary | ICD-10-CM | POA: Diagnosis not present

## 2017-08-09 DIAGNOSIS — E1169 Type 2 diabetes mellitus with other specified complication: Secondary | ICD-10-CM | POA: Diagnosis not present

## 2017-08-09 DIAGNOSIS — Z794 Long term (current) use of insulin: Secondary | ICD-10-CM | POA: Diagnosis not present

## 2017-08-15 ENCOUNTER — Ambulatory Visit (INDEPENDENT_AMBULATORY_CARE_PROVIDER_SITE_OTHER): Payer: Managed Care, Other (non HMO) | Admitting: Osteopathic Medicine

## 2017-08-15 ENCOUNTER — Encounter: Payer: Self-pay | Admitting: Osteopathic Medicine

## 2017-08-15 VITALS — BP 120/79 | HR 92 | Temp 98.2°F | Wt 241.1 lb

## 2017-08-15 DIAGNOSIS — R251 Tremor, unspecified: Secondary | ICD-10-CM | POA: Diagnosis not present

## 2017-08-15 DIAGNOSIS — K219 Gastro-esophageal reflux disease without esophagitis: Secondary | ICD-10-CM

## 2017-08-15 DIAGNOSIS — I10 Essential (primary) hypertension: Secondary | ICD-10-CM | POA: Diagnosis not present

## 2017-08-15 DIAGNOSIS — B351 Tinea unguium: Secondary | ICD-10-CM | POA: Diagnosis not present

## 2017-08-15 DIAGNOSIS — Z794 Long term (current) use of insulin: Secondary | ICD-10-CM

## 2017-08-15 DIAGNOSIS — R1030 Lower abdominal pain, unspecified: Secondary | ICD-10-CM | POA: Diagnosis not present

## 2017-08-15 DIAGNOSIS — E118 Type 2 diabetes mellitus with unspecified complications: Secondary | ICD-10-CM | POA: Diagnosis not present

## 2017-08-15 MED ORDER — OMEPRAZOLE 40 MG PO CPDR: 40.0000 mg | DELAYED_RELEASE_CAPSULE | Freq: Every day | ORAL | 1 refills | Status: DC

## 2017-08-15 MED ORDER — FLUTICASONE PROPIONATE 50 MCG/ACT NA SUSP: 1.0000 | Freq: Every day | NASAL | 3 refills | Status: DC

## 2017-08-15 MED ORDER — ATORVASTATIN CALCIUM 20 MG PO TABS: 20.0000 mg | ORAL_TABLET | ORAL | 3 refills | Status: DC

## 2017-08-15 MED ORDER — HYDROCHLOROTHIAZIDE 25 MG PO TABS: 25.0000 mg | ORAL_TABLET | Freq: Every day | ORAL | 1 refills | Status: DC

## 2017-08-15 NOTE — Patient Instructions (Addendum)
Please call [facility] to inquire about follow-up on new medicines. [phone] Should also hear back from foot doctor and stomach doctor

## 2017-08-15 NOTE — Progress Notes (Signed)
HPI: Dominique Russell is a 62 y.o. female who  has a past medical history of Diabetes ([place]), Essential tremor, Fibromyalgia, and Hypertension.  she presents to [facility] today, 08/15/17,  for chief complaint of: (see headings)  Follow-up abdominal pain Still burning sensation in lower abdomen, distinct from GERD but she also c/o GERD. The PPI has been helping a lot for both pain symptoms. No diarrhea/constipaion, no nausea/vomiting, no blood in stool. Pt reports normal colonoscopy few years ago.    Needs refills BP meds HTN, BP okay. No CP/SOB, no HA/VC.    Question about new medicine from neurology Was prescribed Artane by neurology, reports this seems to be making memory problems worse, though her tremor is a lot better.    Requests podiatry referral Toenails are thickened and she states someone told her at one point she cannot cut them herself or have family help. R great toe is worst.   DM2 Following with endocrinologist, no additional questions/concerns today.    Needs handicap form filled out for parking permit Difficulty ambulating far without cane, back pain.     Patient is accompanied by daughter who assists with history-taking.   Past medical history, surgical history, and family history reviewed.  Current medication list and allergy/intolerance information reviewed.   (See remainder of HPI, ROS, Phys Exam below)    ASSESSMENT/PLAN:   Lower abdominal pain - Plan: omeprazole (PRILOSEC) 40 MG capsule, Ambulatory referral to Gastroenterology  Gastroesophageal reflux disease, esophagitis presence not specified - Plan: omeprazole (PRILOSEC) 40 MG capsule  Onychomycosis - advised can file nails down and this would be safer alternative to cutting them.  - Plan: Ambulatory referral to Podiatry  Type 2 diabetes mellitus with complication, with long-term current use of insulin ([place]) - Plan: Ambulatory referral to Podiatry,  atorvastatin (LIPITOR) 20 MG tablet  Essential hypertension - Plan: hydrochlorothiazide (HYDRODIURIL) 25 MG tablet  Tremor - following with neurology, records reviewed. Meds can cause cognitive issues, risk of this vs benefit of tremor control is a QOL/personal decision. F/u neuro    Meds ordered this encounter  Medications  . fluticasone (FLONASE) 50 MCG/ACT nasal spray    Sig: Place 1-2 sprays into both nostrils daily.    Dispense:  48 g    Refill:  3  . omeprazole (PRILOSEC) 40 MG capsule    Sig: Take 1 capsule (40 mg total) by mouth daily.    Dispense:  90 capsule    Refill:  1    WOULD LIKE MORE REFILLS PLEASE  . atorvastatin (LIPITOR) 20 MG tablet    Sig: Take 1 tablet (20 mg total) by mouth every other day.    Dispense:  45 tablet    Refill:  3  . hydrochlorothiazide (HYDRODIURIL) 25 MG tablet    Sig: Take 1 tablet (25 mg total) by mouth daily.    Dispense:  90 tablet    Refill:  1    Patient Instructions  Please call [facility] to inquire about follow-up on new medicines. [phone] Should also hear back from foot doctor and stomach doctor     Follow-up plan: Return in about 3 months (around 11/15/2017) for annual wellness visit and routine labs (AM appt - fasting) .     ############################################ ############################################ ############################################ ############################################    Outpatient Encounter Medications as of 08/15/2017  Medication Sig  . acetaminophen (TYLENOL) 650 MG CR tablet Take 650 mg by mouth every 8 (eight) hours as needed for pain.  Marland Kitchen  aspirin (GOODSENSE ASPIRIN) 325 MG tablet Take 325 mg by mouth.  Marland Kitchen atorvastatin (LIPITOR) 20 MG tablet Take 0.5 tablets (10 mg total) by mouth daily. (Patient taking differently: Take 20 mg by mouth every other day. )  . B-D UF III MINI PEN NEEDLES 31G X 5 MM MISC USE AS DIRECTED WITH INSULIN AND VICTOZA.  Marland Kitchen Cholecalciferol  (VITAMIN D-3) 1000 units CAPS Take by mouth.  . clobetasol ointment (TEMOVATE) 7.19 % Apply 1 application topically 2 (two) times daily. To affected area(s) as needed, max 2-3 weeks to avoid whitening/thinning skin  . diclofenac sodium (VOLTAREN) 1 % GEL Place onto the skin.  . Fluocinolone Acetonide 0.01 % OIL Place 5 drops in ear(s) 2 (two) times daily as needed. For itching ear up to 2 weeks  . fluticasone (FLONASE) 50 MCG/ACT nasal spray   . folic acid (FOLVITE) 597 MCG tablet Take 400 mcg by mouth daily.  . hydrochlorothiazide (HYDRODIURIL) 25 MG tablet Take 1 tablet (25 mg total) by mouth daily.  . insulin aspart protamine- aspart (NOVOLOG MIX 70/30) (70-30) 100 UNIT/ML injection Inject into the skin.  Marland Kitchen liraglutide (VICTOZA) 18 MG/3ML SOPN Inject 1.8 mg into the skin.  Marland Kitchen losartan (COZAAR) 100 MG tablet TAKE 1 TABLET BY MOUTH EVERY DAY  . LYRICA 150 MG capsule Take 1 tablet by mouth 2 (two) times daily.   . meloxicam (MOBIC) 15 MG tablet   . Omega-3 Fatty Acids (FISH OIL) 1000 MG CAPS Take by mouth.  Marland Kitchen omeprazole (PRILOSEC) 40 MG capsule Take 1 capsule (40 mg total) by mouth daily. Last refill. Pt must make an appt w/PCP if symptoms continue.  . ONE TOUCH ULTRA TEST test strip   . pioglitazone-metformin (ACTOPLUS MET) 15-850 MG per tablet Take 1 tablet by mouth 2 (two) times daily with a meal.  . primidone (MYSOLINE) 50 MG tablet Take 1 tablet (50 mg total) by mouth 2 (two) times daily.  . simethicone (MYLICON) 80 MG chewable tablet Chew 80 mg every 6 (six) hours as needed by mouth for flatulence.  Marland Kitchen tiZANidine (ZANAFLEX) 2 MG tablet TAKE 1 TABLET (2 MG TOTAL) BY MOUTH 3 (THREE) TIMES DAILY AS NEEDED-CAPSULES ARE NOT COVERED.  Marland Kitchen traZODone (DESYREL) 100 MG tablet Take 1 tablet (100 mg total) by mouth at bedtime.  . trihexyphenidyl (ARTANE) 2 MG tablet Take 1 tablet (2 mg total) by mouth daily.  . vitamin B-12 (CYANOCOBALAMIN) 1000 MCG tablet Take by mouth.  . vitamin E 400 UNIT capsule  Take 400 Units by mouth daily.  . DULoxetine (CYMBALTA) 60 MG capsule Take 60 mg by mouth.   No facility-administered encounter medications on file as of 08/15/2017.    No Known Allergies    Review of Systems:  Constitutional: No recent illness  HEENT: No  headache, no vision change  Cardiac: No  chest pain, No  pressure, No palpitations  Respiratory:  No  shortness of breath. No  Cough  Gastrointestinal: +abdominal pain as per HPI, no change on bowel habits  Musculoskeletal: No new myalgia/arthralgia  Skin: No  Rash  Exam:  BP 120/79 (BP Location: Left Arm, Patient Position: Sitting, Cuff Size: Normal)   Pulse 92   Temp 98.2 F (36.8 C) (Oral)   Wt 241 lb 1.6 oz (109.4 kg)   BMI 40.75 kg/m   Constitutional: VS see above. General Appearance: alert, well-developed, well-nourished, NAD  Eyes: Normal lids and conjunctive, non-icteric sclera  Ears, Nose, Mouth, Throat: MMM, Normal external inspection ears/nares/mouth/lips/gums.  Neck: No  masses, trachea midline.   Respiratory: Normal respiratory effort. no wheeze, no rhonchi, no rales  Cardiovascular: S1/S2 normal, no murmur, no rub/gallop auscultated. RRR.   Musculoskeletal: Gait normal. Symmetric and independent movement of all extremities  Neurological: Normal balance/coordination. No tremor.  Skin: warm, dry, intact.   Psychiatric: Normal judgment/insight. Normal mood and affect. Oriented x3.   Visit summary with medication list and pertinent instructions was printed for patient to review, advised to alert [place] if any changes needed. All questions at time of visit were answered - patient instructed to contact office with any additional concerns. ER/RTC precautions were reviewed with the patient and understanding verbalized.   Follow-up plan: Return in about 3 months (around 11/15/2017) for annual wellness visit and routine labs (AM appt - fasting) .  Note: Total time spent 25 minutes, greater than 50% of the visit  was spent face-to-face counseling and coordinating care for the following: The primary encounter diagnosis was Lower abdominal pain. Diagnoses of Gastroesophageal reflux disease, esophagitis presence not specified, Onychomycosis, Type 2 diabetes mellitus with complication, with long-term current use of insulin ([place]), Essential hypertension, and Tremor were also pertinent to this visit.Marland Kitchen  Please note: voice recognition software was used to produce this document, and typos may escape review. Please contact Dr. Sheppard Coil for any needed clarifications.

## 2017-08-16 ENCOUNTER — Encounter: Payer: Self-pay | Admitting: Osteopathic Medicine

## 2017-09-27 ENCOUNTER — Ambulatory Visit: Payer: Managed Care, Other (non HMO) | Admitting: Podiatry

## 2017-09-29 ENCOUNTER — Other Ambulatory Visit: Payer: Self-pay | Admitting: Osteopathic Medicine

## 2017-09-29 DIAGNOSIS — F339 Major depressive disorder, recurrent, unspecified: Secondary | ICD-10-CM

## 2017-10-02 NOTE — Telephone Encounter (Signed)
Last OV was 08/15/2017. Patient does not have a follow up on file. Please advise.

## 2017-10-16 ENCOUNTER — Other Ambulatory Visit: Payer: Self-pay | Admitting: Neurology

## 2017-10-17 ENCOUNTER — Encounter: Payer: Self-pay | Admitting: Osteopathic Medicine

## 2017-10-22 ENCOUNTER — Other Ambulatory Visit: Payer: Self-pay | Admitting: Neurology

## 2017-10-30 ENCOUNTER — Encounter: Payer: Self-pay | Admitting: Podiatry

## 2017-10-30 ENCOUNTER — Ambulatory Visit (INDEPENDENT_AMBULATORY_CARE_PROVIDER_SITE_OTHER): Payer: Managed Care, Other (non HMO) | Admitting: Podiatry

## 2017-10-30 DIAGNOSIS — B351 Tinea unguium: Secondary | ICD-10-CM

## 2017-10-30 DIAGNOSIS — M79671 Pain in right foot: Secondary | ICD-10-CM | POA: Diagnosis not present

## 2017-10-30 DIAGNOSIS — M79672 Pain in left foot: Secondary | ICD-10-CM

## 2017-10-30 NOTE — Patient Instructions (Signed)
Seen for hypertrophic nails. All nails debrided. Return in 3 months or as needed.  

## 2017-10-30 NOTE — Progress Notes (Signed)
Subjective: 62 y.o. year old female patient presents complaining of painful nails. Patient requests toe nails trimmed.  Big toes were hurting in her shoes. Her blood sugar was 89 yesterday.   Objective: Dermatologic: Thick yellow deformed nails x 10. Both big toe nails hurt. Vascular: Pedal pulses are all palpable. Orthopedic: No gross deformities. Neurologic: All epicritic and tactile sensations grossly intact.  Assessment: Dystrophic mycotic nails x 10. Painful nails both feet.  Treatment: All mycotic nails debrided.  Return in 3 months or as needed.

## 2017-11-14 ENCOUNTER — Other Ambulatory Visit: Payer: Self-pay | Admitting: Neurology

## 2017-11-23 ENCOUNTER — Other Ambulatory Visit: Payer: Self-pay | Admitting: Neurology

## 2017-11-29 DIAGNOSIS — M19011 Primary osteoarthritis, right shoulder: Secondary | ICD-10-CM | POA: Diagnosis not present

## 2017-11-29 DIAGNOSIS — G894 Chronic pain syndrome: Secondary | ICD-10-CM | POA: Diagnosis not present

## 2017-11-29 DIAGNOSIS — M170 Bilateral primary osteoarthritis of knee: Secondary | ICD-10-CM | POA: Diagnosis not present

## 2017-11-29 DIAGNOSIS — M797 Fibromyalgia: Secondary | ICD-10-CM | POA: Diagnosis not present

## 2017-12-05 ENCOUNTER — Other Ambulatory Visit: Payer: Self-pay | Admitting: Neurology

## 2017-12-27 ENCOUNTER — Ambulatory Visit: Payer: Managed Care, Other (non HMO) | Admitting: Osteopathic Medicine

## 2017-12-27 ENCOUNTER — Ambulatory Visit (INDEPENDENT_AMBULATORY_CARE_PROVIDER_SITE_OTHER): Payer: Managed Care, Other (non HMO) | Admitting: Osteopathic Medicine

## 2017-12-27 DIAGNOSIS — Z23 Encounter for immunization: Secondary | ICD-10-CM | POA: Diagnosis not present

## 2018-01-02 DIAGNOSIS — E1169 Type 2 diabetes mellitus with other specified complication: Secondary | ICD-10-CM | POA: Diagnosis not present

## 2018-01-02 DIAGNOSIS — Z794 Long term (current) use of insulin: Secondary | ICD-10-CM | POA: Diagnosis not present

## 2018-01-02 DIAGNOSIS — Z9119 Patient's noncompliance with other medical treatment and regimen: Secondary | ICD-10-CM | POA: Diagnosis not present

## 2018-01-02 DIAGNOSIS — E119 Type 2 diabetes mellitus without complications: Secondary | ICD-10-CM | POA: Diagnosis not present

## 2018-01-02 DIAGNOSIS — E1142 Type 2 diabetes mellitus with diabetic polyneuropathy: Secondary | ICD-10-CM | POA: Diagnosis not present

## 2018-01-15 DIAGNOSIS — Z713 Dietary counseling and surveillance: Secondary | ICD-10-CM | POA: Diagnosis not present

## 2018-01-15 DIAGNOSIS — E119 Type 2 diabetes mellitus without complications: Secondary | ICD-10-CM | POA: Diagnosis not present

## 2018-01-16 ENCOUNTER — Other Ambulatory Visit: Payer: Self-pay | Admitting: Neurology

## 2018-01-22 ENCOUNTER — Ambulatory Visit: Payer: Managed Care, Other (non HMO) | Admitting: Podiatry

## 2018-01-23 ENCOUNTER — Other Ambulatory Visit: Payer: Self-pay | Admitting: Osteopathic Medicine

## 2018-01-24 DIAGNOSIS — G894 Chronic pain syndrome: Secondary | ICD-10-CM | POA: Diagnosis not present

## 2018-01-24 DIAGNOSIS — M170 Bilateral primary osteoarthritis of knee: Secondary | ICD-10-CM | POA: Diagnosis not present

## 2018-01-24 DIAGNOSIS — M797 Fibromyalgia: Secondary | ICD-10-CM | POA: Diagnosis not present

## 2018-01-24 DIAGNOSIS — M19011 Primary osteoarthritis, right shoulder: Secondary | ICD-10-CM | POA: Diagnosis not present

## 2018-01-27 ENCOUNTER — Other Ambulatory Visit: Payer: Self-pay | Admitting: Osteopathic Medicine

## 2018-01-31 ENCOUNTER — Ambulatory Visit (INDEPENDENT_AMBULATORY_CARE_PROVIDER_SITE_OTHER): Payer: Managed Care, Other (non HMO) | Admitting: Osteopathic Medicine

## 2018-01-31 ENCOUNTER — Telehealth: Payer: Self-pay | Admitting: Osteopathic Medicine

## 2018-01-31 ENCOUNTER — Encounter: Payer: Self-pay | Admitting: Osteopathic Medicine

## 2018-01-31 VITALS — BP 122/59 | HR 78 | Temp 98.1°F | Wt 240.4 lb

## 2018-01-31 DIAGNOSIS — E559 Vitamin D deficiency, unspecified: Secondary | ICD-10-CM

## 2018-01-31 DIAGNOSIS — F339 Major depressive disorder, recurrent, unspecified: Secondary | ICD-10-CM

## 2018-01-31 DIAGNOSIS — E538 Deficiency of other specified B group vitamins: Secondary | ICD-10-CM

## 2018-01-31 DIAGNOSIS — Z6841 Body Mass Index (BMI) 40.0 and over, adult: Secondary | ICD-10-CM

## 2018-01-31 DIAGNOSIS — E118 Type 2 diabetes mellitus with unspecified complications: Secondary | ICD-10-CM | POA: Diagnosis not present

## 2018-01-31 DIAGNOSIS — G8929 Other chronic pain: Secondary | ICD-10-CM

## 2018-01-31 DIAGNOSIS — Z0000 Encounter for general adult medical examination without abnormal findings: Secondary | ICD-10-CM

## 2018-01-31 DIAGNOSIS — Z794 Long term (current) use of insulin: Secondary | ICD-10-CM

## 2018-01-31 DIAGNOSIS — M25561 Pain in right knee: Secondary | ICD-10-CM

## 2018-01-31 DIAGNOSIS — I10 Essential (primary) hypertension: Secondary | ICD-10-CM | POA: Diagnosis not present

## 2018-01-31 DIAGNOSIS — E1122 Type 2 diabetes mellitus with diabetic chronic kidney disease: Secondary | ICD-10-CM

## 2018-01-31 DIAGNOSIS — N183 Chronic kidney disease, stage 3 unspecified: Secondary | ICD-10-CM

## 2018-01-31 DIAGNOSIS — L409 Psoriasis, unspecified: Secondary | ICD-10-CM

## 2018-01-31 DIAGNOSIS — G4700 Insomnia, unspecified: Secondary | ICD-10-CM

## 2018-01-31 DIAGNOSIS — M25562 Pain in left knee: Secondary | ICD-10-CM

## 2018-01-31 MED ORDER — DULOXETINE HCL 60 MG PO CPEP: 60.0000 mg | ORAL_CAPSULE | Freq: Every day | ORAL | 3 refills | Status: DC

## 2018-01-31 MED ORDER — TRAZODONE HCL 150 MG PO TABS: 150.0000 mg | ORAL_TABLET | Freq: Every day | ORAL | 3 refills | Status: DC

## 2018-01-31 MED ORDER — LOSARTAN POTASSIUM 100 MG PO TABS: 100.0000 mg | ORAL_TABLET | Freq: Every day | ORAL | 3 refills | Status: DC

## 2018-01-31 MED ORDER — HYDROCHLOROTHIAZIDE 25 MG PO TABS: 25.0000 mg | ORAL_TABLET | Freq: Every day | ORAL | 3 refills | Status: DC

## 2018-01-31 MED ORDER — ATORVASTATIN CALCIUM 20 MG PO TABS: 20.0000 mg | ORAL_TABLET | ORAL | 3 refills | Status: DC

## 2018-01-31 NOTE — Telephone Encounter (Signed)
-----   Message from Sunnie Nielsen, DO sent at 01/31/2018 10:23 AM EST ----- Regarding: shinglez shingrix list!

## 2018-01-31 NOTE — Patient Instructions (Signed)
General Preventive Care  Most recent routine screening lipids/other labs: ordered today   Tobacco: don't take up smoking!   Alcohol: responsible moderation is ok for most adults.  Exercise: as tolerated to reduce risk of cardiovascular disease and diabetes. Strength training will also prevent osteoporosis. Low-imapct exercises such as stationary bike or water exercises are best for people with knee pain.   Mental health: if need for mental health care (medicines, counseling, other), or concerns about moods, please let me know!   Sexual health: if need for STD testing, or if concerns with libido/pain problems, please let me know!   Advanced Directive: Living Will and/or Healthcare Power of Attorney recommended for all adults, regardless of age or health. See printed information on this.  Vaccines  Flu vaccine: recommended for almost everyone, every fall. Thanks for getting yours this year!   Shingles vaccine: Shingrix recommended after age 48. Will put you on our clinic's list for when we have this vaccine in stock.   Pneumonia vaccines: repeat Pneumovax recommended after age 82, or sooner if certain medical conditions.  Tetanus booster: Tdap recommended every 10 years. Due 08/2026 Cancer screenings   Colon cancer screening: will work on getting records from your last colonoscopy  Breast cancer screening: will get records from your last mammogram. Continue these annually.   Cervical cancer screening: will request records form your OBGYN - please call us with their name or the phone number of the office.   Lung cancer screening: not needed since you quit smoking more than 15 years ago.  Infection screenings . HIV: recommended screening at least once age 56-65, more often as needed. . Gonorrhea/Chlamydia: screening as needed . Hepatitis C: recommended for anyone born 16-1967 . TB: certain at-risk populations, or depending on work requirements and/or travel history Other . Bone  Density Test: recommended for women at age 17

## 2018-01-31 NOTE — Progress Notes (Signed)
HPI: Dominique Russell is a 62 y.o. female who  has a past medical history of Diabetes ([place]), Essential tremor, Fibromyalgia, and Hypertension.  she presents to [facility] today, 01/31/18,  for chief complaint of: Requests labs and annual physical     Patient here for annual physical / wellness exam.  See preventive care reviewed as below.  Recent labs reviewed from endocrine.   Additional concerns today include:  Knee pain makes exercise difficult -she is going to ask her pain management dr about injections  Sleep is a problem, would like to try going up on trazodone      Past medical, surgical, social and family history reviewed:  Patient Active Problem List   Diagnosis Date Noted  . Essential hypertension 03/17/2016  . Psoriasis 03/17/2016  . Long-term insulin use in type 2 diabetes ([place]) 02/09/2015  . Morbid obesity with BMI of 45.0-49.9, adult ([place]) 05/16/2014  . DM (diabetes mellitus) ([place]) 01/23/2013  . Low back pain 01/23/2013  . Shoulder pain 01/23/2013  . Bilateral knee pain 12/26/2012  . Bilateral shoulder pain 12/26/2012  . Essential tremor 09/26/2012  . Headache 09/26/2012  . Benign essential tremor 09/26/2012  . Fibromyalgia 03/02/2011  . Generalized osteoarthritis of multiple sites 03/02/2011  . Rheumatoid arthritis ([place]) 03/02/2011    Past Surgical History:  Procedure Laterality Date  . BREAST REDUCTION SURGERY      Social History   Tobacco Use  . Smoking status: Former Smoker    Last attempt to quit: 02/22/1995    Years since quitting: 22.9  . Smokeless tobacco: Never Used  Substance Use Topics  . Alcohol use: No    Comment: 1-2 times per month    Family History  Problem Relation Age of Onset  . Depression Mother   . Hypertension Mother   . Kidney failure Brother   . Other Sister        MVA  . Tremor Neg Hx      Current medication list and allergy/intolerance information reviewed:    Current  Outpatient Medications  Medication Sig Dispense Refill  . acetaminophen (TYLENOL) 650 MG CR tablet Take 650 mg by mouth every 8 (eight) hours as needed for pain.    Marland Kitchen aspirin (GOODSENSE ASPIRIN) 325 MG tablet Take 325 mg by mouth.    Marland Kitchen atorvastatin (LIPITOR) 20 MG tablet Take 1 tablet (20 mg total) by mouth every other day. 45 tablet 3  . B-D UF III MINI PEN NEEDLES 31G X 5 MM MISC USE AS DIRECTED WITH INSULIN AND VICTOZA.  11  . Cholecalciferol (VITAMIN D-3) 1000 units CAPS Take by mouth.    . clobetasol ointment (TEMOVATE) 0.60 % Apply 1 application topically 2 (two) times daily. To affected area(s) as needed, max 2-3 weeks to avoid whitening/thinning skin 45 g 1  . diclofenac sodium (VOLTAREN) 1 % GEL Place onto the skin.    . DULoxetine (CYMBALTA) 60 MG capsule Take 60 mg by mouth.    . Fluocinolone Acetonide 0.01 % OIL Place 5 drops in ear(s) 2 (two) times daily as needed. For itching ear up to 2 weeks 20 mL 1  . fluticasone (FLONASE) 50 MCG/ACT nasal spray Place 1-2 sprays into both nostrils daily. 48 g 3  . folic acid (FOLVITE) 045 MCG tablet Take 400 mcg by mouth daily.    . hydrochlorothiazide (HYDRODIURIL) 25 MG tablet Take 1 tablet (25 mg total) by mouth daily. 90 tablet 1  . insulin aspart protamine-  aspart (NOVOLOG MIX 70/30) (70-30) 100 UNIT/ML injection Inject into the skin.    Marland Kitchen liraglutide (VICTOZA) 18 MG/3ML SOPN Inject 1.8 mg into the skin.    Marland Kitchen losartan (COZAAR) 100 MG tablet Take 1 tablet (100 mg total) by mouth daily. Must make appt for further refills 90 tablet 0  . LYRICA 150 MG capsule Take 1 tablet by mouth 2 (two) times daily.     . meloxicam (MOBIC) 15 MG tablet     . Omega-3 Fatty Acids (FISH OIL) 1000 MG CAPS Take by mouth.    Marland Kitchen omeprazole (PRILOSEC) 40 MG capsule Take 1 capsule (40 mg total) by mouth daily. 90 capsule 1  . ONE TOUCH ULTRA TEST test strip     . pioglitazone-metformin (ACTOPLUS MET) 15-850 MG per tablet Take 1 tablet by mouth 2 (two) times daily  with a meal.    . primidone (MYSOLINE) 50 MG tablet TAKE 1 TABLET BY MOUTH TWICE A DAY 180 tablet 0  . simethicone (MYLICON) 80 MG chewable tablet Chew 80 mg every 6 (six) hours as needed by mouth for flatulence.    Marland Kitchen tiZANidine (ZANAFLEX) 2 MG tablet TAKE 1 TABLET (2 MG TOTAL) BY MOUTH 3 (THREE) TIMES DAILY AS NEEDED-CAPSULES ARE NOT COVERED. 90 tablet 0  . traZODone (DESYREL) 100 MG tablet TAKE 1 TABLET BY MOUTH EVERYDAY AT BEDTIME 90 tablet 1  . trihexyphenidyl (ARTANE) 2 MG tablet Take 1 tablet (2 mg total) by mouth daily. 90 tablet 1  . vitamin B-12 (CYANOCOBALAMIN) 1000 MCG tablet Take by mouth.    . vitamin E 400 UNIT capsule Take 400 Units by mouth daily.     No current facility-administered medications for this visit.     No Known Allergies    Review of Systems:  Constitutional:  No  fever, no chills, No recent illness   HEENT: No  headache, no vision change  Cardiac: No  chest pain, No  pressure, No palpitations, No  Orthopnea   Respiratory:  No  shortness of breath. No  Cough  Gastrointestinal: No  abdominal pain, No  nausea, No  vomiting,  No  blood in stool, No  diarrhea, No  constipation   Musculoskeletal: No new myalgia/arthralgia  Skin: No  Rash, No other wounds/concerning lesions  Genitourinary: No  incontinence  Hem/Onc: No  easy bruising/bleeding  Endocrine: No cold intolerance,  No heat intolerance. No polyuria/polydipsia/polyphagia   Neurologic: No  weakness, No  dizziness, No  slurred speech/focal weakness/facial droop  Psychiatric: No  concerns with depression, No  concerns with anxiety, +sleep problems, No mood problems  Exam:  BP (!) 122/59 (BP Location: Left Arm, Patient Position: Sitting, Cuff Size: Large)   Pulse 78   Temp 98.1 F (36.7 C) (Oral)   Wt 240 lb 6.4 oz (109 kg)   BMI 40.63 kg/m   Constitutional: VS see above. General Appearance: alert, well-developed, well-nourished, NAD  Eyes: Normal lids and conjunctive, non-icteric  sclera  Ears, Nose, Mouth, Throat: MMM, Normal external inspection ears/nares/mouth/lips/gums. TM normal bilaterally. Pharynx/tonsils no erythema, no exudate. Nasal mucosa normal.   Neck: No masses, trachea midline. No thyroid enlargement. No tenderness/mass appreciated. No lymphadenopathy  Respiratory: Normal respiratory effort. no wheeze, no rhonchi, no rales  Cardiovascular: S1/S2 normal, no murmur, no rub/gallop auscultated. RRR. No lower extremity edema.   Gastrointestinal: Nontender, no masses. No hepatomegaly, no splenomegaly. No hernia appreciated. Bowel sounds normal. Rectal exam deferred.   Musculoskeletal: Gait normal. No clubbing/cyanosis of digits.   Neurological:  Normal balance/coordination. No tremor. No cranial nerve deficit on limited exam. Motor and sensation intact and symmetric. Cerebellar reflexes intact.   Skin: warm, dry, intact. No rash/ulcer. No concerning nevi or subq nodules on limited exam.    Psychiatric: Normal judgment/insight. Normal mood and affect. Oriented x3.        ASSESSMENT/PLAN: The primary encounter diagnosis was Annual physical exam. Diagnoses of Type 2 diabetes mellitus with complication, with long-term current use of insulin ([place]), Essential hypertension, Depression, recurrent ([place]), Morbid obesity with BMI of 45.0-49.9, adult ([place]), Chronic pain of both knees, Vitamin B12 deficiency, Vitamin D deficiency, Insomnia, unspecified type, and Psoriasis were also pertinent to this visit.   Orders Placed This Encounter  Procedures  . CBC  . COMPLETE METABOLIC PANEL WITH GFR  . Lipid panel  . TSH  . Hepatitis C antibody  . HIV Antibody (routine testing w rflx)  . Microalbumin / creatinine urine ratio  . Vitamin B12  . VITAMIN D 25 Hydroxy (Vit-D Deficiency, Fractures)    Meds ordered this encounter  Medications  . traZODone (DESYREL) 150 MG tablet    Sig: Take 1 tablet (150 mg total) by mouth at bedtime.    Dispense:  90 tablet     Refill:  3    Cancel 100 mg dose please!  Marland Kitchen losartan (COZAAR) 100 MG tablet    Sig: Take 1 tablet (100 mg total) by mouth daily. Must make appt for further refills    Dispense:  90 tablet    Refill:  3  . hydrochlorothiazide (HYDRODIURIL) 25 MG tablet    Sig: Take 1 tablet (25 mg total) by mouth daily.    Dispense:  90 tablet    Refill:  3  . DULoxetine (CYMBALTA) 60 MG capsule    Sig: Take 1 capsule (60 mg total) by mouth daily.    Dispense:  90 capsule    Refill:  3  . atorvastatin (LIPITOR) 20 MG tablet    Sig: Take 1 tablet (20 mg total) by mouth every other day.    Dispense:  45 tablet    Refill:  3    Patient Instructions  General Preventive Care  Most recent routine screening lipids/other labs: ordered today   Tobacco: don't take up smoking!   Alcohol: responsible moderation is ok for most adults.  Exercise: as tolerated to reduce risk of cardiovascular disease and diabetes. Strength training will also prevent osteoporosis. Low-imapct exercises such as stationary bike or water exercises are best for people with knee pain.   Mental health: if need for mental health care (medicines, counseling, other), or concerns about moods, please let me know!   Sexual health: if need for STD testing, or if concerns with libido/pain problems, please let me know!   Advanced Directive: Living Will and/or Healthcare Power of Attorney recommended for all adults, regardless of age or health. See printed information on this.  Vaccines  Flu vaccine: recommended for almost everyone, every fall. Thanks for getting yours this year!   Shingles vaccine: Shingrix recommended after age 75. Will put you on our clinic's list for when we have this vaccine in stock.   Pneumonia vaccines: repeat Pneumovax recommended after age 79, or sooner if certain medical conditions.  Tetanus booster: Tdap recommended every 10 years. Due 08/2026 Cancer screenings   Colon cancer screening: will work on getting  records from your last colonoscopy  Breast cancer screening: will get records from your last mammogram. Continue these annually.   Cervical  cancer screening: will request records form your OBGYN - please call us with their name or the phone number of the office.   Lung cancer screening: not needed since you quit smoking more than 15 years ago.  Infection screenings . HIV: recommended screening at least once age 81-65, more often as needed. . Gonorrhea/Chlamydia: screening as needed . Hepatitis C: recommended for anyone born 42-1967 . TB: certain at-risk populations, or depending on work requirements and/or travel history Other . Bone Density Test: recommended for women at age 22    Immunization History  Administered Date(s) Administered  . H1N1 01/24/2008  . Influenza,inj,Quad PF,6+ Mos 11/23/2012, 10/26/2016, 12/27/2017  . Influenza-Unspecified 11/23/2012, 11/22/2015  . Pneumococcal Polysaccharide-23 09/14/2016  . Tdap 09/14/2016      Visit summary with medication list and pertinent instructions was printed for patient to review. All questions at time of visit were answered - patient instructed to contact office with any additional concerns or updates. ER/RTC precautions were reviewed with the patient.    Please note: voice recognition software was used to produce this document, and typos may escape review. Please contact Dr. Sheppard Coil for any needed clarifications.     Follow-up plan: Return in about 6 months (around 08/02/2018) for blood pressure and other chronic conditions  monitoring, sooner if needed.

## 2018-01-31 NOTE — Telephone Encounter (Signed)
Added

## 2018-02-01 LAB — LIPID PANEL
Cholesterol: 149 mg/dL (ref <200)
HDL: 48 mg/dL — ABNORMAL LOW (ref >50)
LDL Cholesterol (Calc): 72 mg/dL (calc)
NON-HDL CHOLESTEROL (CALC): 101 mg/dL (ref <130)
Total CHOL/HDL Ratio: 3.1 (calc) (ref <5.0)
Triglycerides: 197 mg/dL — ABNORMAL HIGH (ref <150)

## 2018-02-01 LAB — TSH: TSH: 0.94 mIU/L (ref 0.40–4.50)

## 2018-02-01 LAB — COMPLETE METABOLIC PANEL WITH GFR
AG RATIO: 1.3 (calc) (ref 1.0–2.5)
ALBUMIN MSPROF: 4 g/dL (ref 3.6–5.1)
ALT: 13 U/L (ref 6–29)
AST: 19 U/L (ref 10–35)
Alkaline phosphatase (APISO): 67 U/L (ref 33–130)
BILIRUBIN TOTAL: 0.2 mg/dL (ref 0.2–1.2)
BUN / CREAT RATIO: 17 (calc) (ref 6–22)
BUN: 28 mg/dL — ABNORMAL HIGH (ref 7–25)
CALCIUM: 8.6 mg/dL (ref 8.6–10.4)
CO2: 25 mmol/L (ref 20–32)
CREATININE: 1.61 mg/dL — AB (ref 0.50–0.99)
Chloride: 104 mmol/L (ref 98–110)
GFR, EST AFRICAN AMERICAN: 39 mL/min/{1.73_m2} — AB (ref >=60)
GFR, EST NON AFRICAN AMERICAN: 33 mL/min/{1.73_m2} — AB (ref >=60)
Globulin: 3.1 g/dL (calc) (ref 1.9–3.7)
Glucose, Bld: 81 mg/dL (ref 65–99)
POTASSIUM: 4.9 mmol/L (ref 3.5–5.3)
SODIUM: 139 mmol/L (ref 135–146)
Total Protein: 7.1 g/dL (ref 6.1–8.1)

## 2018-02-01 LAB — CBC
HEMATOCRIT: 35.2 % (ref 35.0–45.0)
HEMOGLOBIN: 11.4 g/dL — AB (ref 11.7–15.5)
MCH: 28.1 pg (ref 27.0–33.0)
MCHC: 32.4 g/dL (ref 32.0–36.0)
MCV: 86.9 fL (ref 80.0–100.0)
MPV: 11.1 fL (ref 7.5–12.5)
PLATELETS: 203 10*3/uL (ref 140–400)
RBC: 4.05 10*6/uL (ref 3.80–5.10)
RDW: 12.4 % (ref 11.0–15.0)
WBC: 4 10*3/uL (ref 3.8–10.8)

## 2018-02-01 LAB — VITAMIN D 25 HYDROXY (VIT D DEFICIENCY, FRACTURES): Vit D, 25-Hydroxy: 13 ng/mL — ABNORMAL LOW (ref 30–100)

## 2018-02-01 LAB — VITAMIN B12: Vitamin B-12: 767 pg/mL (ref 200–1100)

## 2018-02-01 LAB — MICROALBUMIN / CREATININE URINE RATIO
Creatinine, Urine: 241 mg/dL (ref 20–275)
Microalb Creat Ratio: 20 mcg/mg creat (ref <30)
Microalb, Ur: 4.8 mg/dL

## 2018-02-01 LAB — HEPATITIS C ANTIBODY
HEP C AB: NONREACTIVE
SIGNAL TO CUT-OFF: 0.02 (ref <1.00)

## 2018-02-01 LAB — HIV ANTIBODY (ROUTINE TESTING W REFLEX): HIV: NONREACTIVE

## 2018-02-02 MED ORDER — VITAMIN D (ERGOCALCIFEROL) 1.25 MG (50000 UNIT) PO CAPS: 50000.0000 [IU] | ORAL_CAPSULE | ORAL | 0 refills | Status: DC

## 2018-02-02 NOTE — Addendum Note (Signed)
Addended by: Deirdre Pippins on: 02/02/2018 09:52 AM   Modules accepted: Orders

## 2018-02-08 ENCOUNTER — Encounter: Payer: Self-pay | Admitting: Osteopathic Medicine

## 2018-02-22 DIAGNOSIS — Z1231 Encounter for screening mammogram for malignant neoplasm of breast: Secondary | ICD-10-CM | POA: Diagnosis not present

## 2018-02-22 LAB — HM MAMMOGRAPHY

## 2018-03-01 ENCOUNTER — Encounter: Payer: Self-pay | Admitting: Osteopathic Medicine

## 2018-03-21 DIAGNOSIS — M19011 Primary osteoarthritis, right shoulder: Secondary | ICD-10-CM | POA: Diagnosis not present

## 2018-03-21 DIAGNOSIS — M25561 Pain in right knee: Secondary | ICD-10-CM | POA: Diagnosis not present

## 2018-03-21 DIAGNOSIS — G894 Chronic pain syndrome: Secondary | ICD-10-CM | POA: Diagnosis not present

## 2018-03-21 DIAGNOSIS — M170 Bilateral primary osteoarthritis of knee: Secondary | ICD-10-CM | POA: Diagnosis not present

## 2018-04-26 ENCOUNTER — Other Ambulatory Visit: Payer: Self-pay

## 2018-04-26 DIAGNOSIS — R1030 Lower abdominal pain, unspecified: Secondary | ICD-10-CM

## 2018-04-26 DIAGNOSIS — K219 Gastro-esophageal reflux disease without esophagitis: Secondary | ICD-10-CM

## 2018-04-26 MED ORDER — OMEPRAZOLE 40 MG PO CPDR: 40.0000 mg | DELAYED_RELEASE_CAPSULE | Freq: Every day | ORAL | 1 refills | Status: DC

## 2018-04-30 ENCOUNTER — Telehealth: Payer: Self-pay

## 2018-04-30 MED ORDER — LOSARTAN POTASSIUM 50 MG PO TABS: 50.0000 mg | ORAL_TABLET | Freq: Two times a day (BID) | ORAL | 3 refills | Status: DC

## 2018-04-30 NOTE — Telephone Encounter (Signed)
[facility]:   "Losartan 100 mg is currently on long term b/o. If appropriate, can provider send in a rx for losartan 50 mg/bid." Pls advise, thanks.

## 2018-05-01 NOTE — Telephone Encounter (Signed)
Left a detailed vm msg for pt regarding change in bp medication. Pt is aware med will be taken bid. Direct call back info provided.

## 2018-05-03 ENCOUNTER — Telehealth: Payer: Self-pay | Admitting: Osteopathic Medicine

## 2018-05-03 NOTE — Telephone Encounter (Signed)
Dr. Lyn Hollingshead   Ms Willeen Cass called and stated you were going to refer her to a Kidney doctor and she was calling to get the number. I do not see a referral please advise.   Arline Asp

## 2018-05-03 NOTE — Telephone Encounter (Signed)
Patient was supposed to come back for additional blood test to confirm kidney disease.  She has not yet done this.  Orders are in and should still be valid.  Please asked patient to come back to the lab, anytime does not have to be fasting.   From lab result note from December: "Kidney labs are showing that kidney function is slowed, may be dehydration but may be worsening kidney disease. Plan to recheck next week and we may need to send her to a nephrologist aka kidney specialist to monitor this. "  This is what she is talking about.

## 2018-05-04 NOTE — Telephone Encounter (Signed)
Pt walked in clinic and was advised. Lab slip provided.

## 2018-05-04 NOTE — Telephone Encounter (Signed)
Left VM for Pt to return clinic call regarding labs and referral status update.

## 2018-05-05 ENCOUNTER — Other Ambulatory Visit: Payer: Self-pay | Admitting: Osteopathic Medicine

## 2018-05-05 DIAGNOSIS — N183 Chronic kidney disease, stage 3 unspecified: Secondary | ICD-10-CM

## 2018-05-05 LAB — BASIC METABOLIC PANEL WITH GFR
BUN/Creatinine Ratio: 16 (calc) (ref 6–22)
BUN: 25 mg/dL (ref 7–25)
CO2: 25 mmol/L (ref 20–32)
Calcium: 8.7 mg/dL (ref 8.6–10.4)
Chloride: 107 mmol/L (ref 98–110)
Creat: 1.56 mg/dL — ABNORMAL HIGH (ref 0.50–0.99)
GFR, Est African American: 40 mL/min/{1.73_m2} — ABNORMAL LOW (ref >=60)
GFR, Est Non African American: 35 mL/min/{1.73_m2} — ABNORMAL LOW (ref >=60)
Glucose, Bld: 133 mg/dL — ABNORMAL HIGH (ref 65–99)
Potassium: 4.6 mmol/L (ref 3.5–5.3)
Sodium: 140 mmol/L (ref 135–146)

## 2018-05-06 ENCOUNTER — Other Ambulatory Visit: Payer: Self-pay | Admitting: Osteopathic Medicine

## 2018-05-16 DIAGNOSIS — M170 Bilateral primary osteoarthritis of knee: Secondary | ICD-10-CM | POA: Diagnosis not present

## 2018-05-16 DIAGNOSIS — M797 Fibromyalgia: Secondary | ICD-10-CM | POA: Diagnosis not present

## 2018-05-16 DIAGNOSIS — M25561 Pain in right knee: Secondary | ICD-10-CM | POA: Diagnosis not present

## 2018-05-16 DIAGNOSIS — G894 Chronic pain syndrome: Secondary | ICD-10-CM | POA: Diagnosis not present

## 2018-05-23 ENCOUNTER — Other Ambulatory Visit: Payer: Self-pay

## 2018-05-23 ENCOUNTER — Telehealth: Payer: Self-pay

## 2018-05-23 MED ORDER — LOSARTAN POTASSIUM 50 MG PO TABS: 50.0000 mg | ORAL_TABLET | Freq: Two times a day (BID) | ORAL | 3 refills | Status: DC

## 2018-05-23 NOTE — Telephone Encounter (Signed)
[facility] med refill for vitamin D2 50 mcg. Rx not listed in active med list. Pls advise, thanks.

## 2018-05-24 MED ORDER — VITAMIN D-3 25 MCG (1000 UT) PO CAPS: 2000.0000 [IU] | ORAL_CAPSULE | Freq: Every day | ORAL | 3 refills | Status: DC

## 2018-05-29 ENCOUNTER — Encounter: Payer: Self-pay | Admitting: Osteopathic Medicine

## 2018-05-29 ENCOUNTER — Ambulatory Visit (INDEPENDENT_AMBULATORY_CARE_PROVIDER_SITE_OTHER): Payer: Managed Care, Other (non HMO) | Admitting: Osteopathic Medicine

## 2018-05-29 VITALS — BP 107/74 | HR 124

## 2018-05-29 DIAGNOSIS — F4321 Adjustment disorder with depressed mood: Secondary | ICD-10-CM | POA: Diagnosis not present

## 2018-05-29 DIAGNOSIS — L600 Ingrowing nail: Secondary | ICD-10-CM | POA: Diagnosis not present

## 2018-05-29 DIAGNOSIS — F418 Other specified anxiety disorders: Secondary | ICD-10-CM

## 2018-05-29 MED ORDER — DULOXETINE HCL 60 MG PO CPEP: 60.0000 mg | ORAL_CAPSULE | Freq: Two times a day (BID) | ORAL | 3 refills | Status: DC

## 2018-05-29 MED ORDER — CLONAZEPAM 0.5 MG PO TABS: 0.2500 mg | ORAL_TABLET | Freq: Two times a day (BID) | ORAL | 0 refills | Status: DC | PRN

## 2018-05-29 NOTE — Patient Instructions (Addendum)
Plan: Will refer for therapy  Will try increasing Cymbalta to 60 mg twice daily instead of once daily Will add Clonazepam anti-anxiety medicine. LIMIT USE of this medicine as it can cause addiction, but this should help severe anxiety/grief. 45 pills given to last 90 days.  Will recheck in 2 weeks over the phone, call me sooner if any problems.   Please note, increasing Cymbalta plus taking the Trazodone may cause increased risk for something called Serotonin Syndrome: please use half the dose of Trazodone or try stopping altogether. Please call or seek medical care if you experience the following:   Mild symptoms include:  Sweating.  Restlessness or agitation.  Muscle twitching or stiffness.  Rapid heart rate.  Nausea and vomiting.  Diarrhea.  Headache.  Shivering or goose bumps.  Confusion. Severe symptoms include:  Irregular heartbeat.  Seizures.  Loss of consciousness.  High fever.

## 2018-05-29 NOTE — Progress Notes (Signed)
Virtual Visit  via  Phone Note  I connected with      Dominique Russell on 05/29/18 at 10:11 AM by a telemedicine application and verified that I am speaking with the correct person using two identifiers.   I discussed the limitations of evaluation and management by telemedicine and the availability of in person appointments. The patient expressed understanding and agreed to proceed.  History of Present Illness: Dominique Russell is a 63 y.o. female who would like to discuss anxiety   Anxiety is "up real high" due to recent death of her cousin due to [place] illness, she is having difficulty with day to day tasks. She was not in close physical contact with her cousin. She is not having discrete panic attacks, but is nervous all day long. Reports depression is much worse. Currently on Cymbalta 60 mg daily.    Reports also needs referral for ingrowing toenail, has tried to contact Dr Latanya Presser office but states no one is answering the phones.     Observations/Objective: BP 107/74 (BP Location: Left Arm, Patient Position: Sitting, Cuff Size: Large)   Pulse (!) 124  BP Readings from Last 3 Encounters:  05/29/18 107/74  01/31/18 (!) 122/59  08/15/17 120/79   Exam: Normal Speech.  Fair judgment/insight   Lab and Radiology Results No results found for this or any previous visit (from the past 72 hour(s)). No results found.     Assessment and Plan: 63 y.o. female with The primary encounter diagnosis was Situational anxiety. Diagnoses of Grief reaction and Ingrown toenail were also pertinent to this visit.   PDMP reviewed during this encounter. Orders Placed This Encounter  Procedures  . Ambulatory referral to Podiatry    Referral Priority:   Routine    Referral Type:   Consultation    Referral Reason:   Specialty Services Required    Requested Specialty:   Podiatry    Number of Visits Requested:   1  . Ambulatory referral to Behavioral Health    Referral Priority:    Routine    Referral Type:   Psychiatric    Referral Reason:   Specialty Services Required    Requested Specialty:   Behavioral Health    Number of Visits Requested:   1   Meds ordered this encounter  Medications  . clonazePAM (KLONOPIN) 0.5 MG tablet    Sig: Take 0.5-1 tablets (0.25-0.5 mg total) by mouth 2 (two) times daily as needed (severe anxiety. Please print on botle: "Limit use, to prevent addiction. 45 tablets for 90 days").    Dispense:  45 tablet    Refill:  0  . DULoxetine (CYMBALTA) 60 MG capsule    Sig: Take 1 capsule (60 mg total) by mouth 2 (two) times daily.    Dispense:  90 capsule    Refill:  3   Patient Instructions  Plan: Will refer for therapy  Will try increasing Cymbalta to 60 mg twice daily instead of once daily Will add Clonazepam anti-anxiety medicine. LIMIT USE of this medicine as it can cause addiction, but this should help severe anxiety/grief. 45 pills given to last 90 days.  Will recheck in 2 weeks over the phone, call me sooner if any problems.   Please note, increasing Cymbalta plus taking the Trazodone may cause increased risk for something called Serotonin Syndrome: please use half the dose of Trazodone or try stopping altogether. Please call or seek medical care if you experience the following:  Mild symptoms include:  Sweating.  Restlessness or agitation.  Muscle twitching or stiffness.  Rapid heart rate.  Nausea and vomiting.  Diarrhea.  Headache.  Shivering or goose bumps.  Confusion. Severe symptoms include:  Irregular heartbeat.  Seizures.  Loss of consciousness.  High fever.   AVS printed and sent to mail. Pt was given detailed instructions as above and verbalized understanding. Instructions sent via MyChart. If MyChart not available, pt was given option for info via personal e-mail w/ no guarantee of protected health info over unsecured e-mail communication, and MyChart sign-up instructions were included.   Follow  Up Instructions: Return in about 2 weeks (around 06/12/2018) for recheck anxiety.    I discussed the assessment and treatment plan with the patient. The patient was provided an opportunity to ask questions and all were answered. The patient agreed with the plan and demonstrated an understanding of the instructions.   The patient was advised to call back or seek an in-person evaluation if the symptoms worsen or if the condition fails to improve as anticipated.  I provided 30 minutes of non-face-to-face time during this encounter.                      Historical information moved to improve visibility of documentation.  Past Medical History:  Diagnosis Date  . Diabetes ([place])   . Essential tremor   . Fibromyalgia   . Hypertension    Past Surgical History:  Procedure Laterality Date  . BREAST REDUCTION SURGERY     Social History   Tobacco Use  . Smoking status: Former Smoker    Last attempt to quit: 02/22/1995    Years since quitting: 23.2  . Smokeless tobacco: Never Used  Substance Use Topics  . Alcohol use: No    Comment: 1-2 times per month   family history includes Depression in her mother; Hypertension in her mother; Kidney failure in her brother; Other in her sister.  Medications: Current Outpatient Medications  Medication Sig Dispense Refill  . acetaminophen (TYLENOL) 650 MG CR tablet Take 650 mg by mouth every 8 (eight) hours as needed for pain.    Marland Kitchen atorvastatin (LIPITOR) 20 MG tablet Take 1 tablet (20 mg total) by mouth every other day. 45 tablet 3  . B-D UF III MINI PEN NEEDLES 31G X 5 MM MISC USE AS DIRECTED WITH INSULIN AND VICTOZA.  11  . Cholecalciferol (VITAMIN D-3) 25 MCG (1000 UT) CAPS Take 2 capsules (2,000 Units total) by mouth daily. 180 capsule 3  . clobetasol ointment (TEMOVATE) 8.67 % Apply 1 application topically 2 (two) times daily. To affected area(s) as needed, max 2-3 weeks to avoid whitening/thinning skin 45 g 1  . diclofenac sodium  (VOLTAREN) 1 % GEL Place onto the skin.    . DULoxetine (CYMBALTA) 60 MG capsule Take 1 capsule (60 mg total) by mouth 2 (two) times daily. 90 capsule 3  . Fluocinolone Acetonide 0.01 % OIL Place 5 drops in ear(s) 2 (two) times daily as needed. For itching ear up to 2 weeks 20 mL 1  . fluticasone (FLONASE) 50 MCG/ACT nasal spray Place 1-2 sprays into both nostrils daily. 48 g 3  . folic acid (FOLVITE) 619 MCG tablet Take 400 mcg by mouth daily.    . hydrochlorothiazide (HYDRODIURIL) 25 MG tablet Take 1 tablet (25 mg total) by mouth daily. 90 tablet 3  . insulin aspart protamine- aspart (NOVOLOG MIX 70/30) (70-30) 100 UNIT/ML injection Inject into the skin.    Marland Kitchen  liraglutide (VICTOZA) 18 MG/3ML SOPN Inject 1.8 mg into the skin.    Marland Kitchen losartan (COZAAR) 50 MG tablet Take 1 tablet (50 mg total) by mouth 2 (two) times daily. 180 tablet 3  . LYRICA 150 MG capsule Take 1 tablet by mouth 2 (two) times daily.     . meloxicam (MOBIC) 15 MG tablet     . Omega-3 Fatty Acids (FISH OIL) 1000 MG CAPS Take by mouth.    Marland Kitchen omeprazole (PRILOSEC) 40 MG capsule Take 1 capsule (40 mg total) by mouth daily. 90 capsule 1  . ONE TOUCH ULTRA TEST test strip     . pioglitazone-metformin (ACTOPLUS MET) 15-850 MG per tablet Take 1 tablet by mouth 2 (two) times daily with a meal.    . primidone (MYSOLINE) 50 MG tablet TAKE 1 TABLET BY MOUTH TWICE A DAY 180 tablet 0  . simethicone (MYLICON) 80 MG chewable tablet Chew 80 mg every 6 (six) hours as needed by mouth for flatulence.    Marland Kitchen tiZANidine (ZANAFLEX) 2 MG tablet TAKE 1 TABLET (2 MG TOTAL) BY MOUTH 3 (THREE) TIMES DAILY AS NEEDED-CAPSULES ARE NOT COVERED. 90 tablet 0  . traZODone (DESYREL) 150 MG tablet Take 1 tablet (150 mg total) by mouth at bedtime. 90 tablet 3  . trihexyphenidyl (ARTANE) 2 MG tablet Take 1 tablet (2 mg total) by mouth daily. 90 tablet 1  . vitamin B-12 (CYANOCOBALAMIN) 1000 MCG tablet Take by mouth.    . vitamin E 400 UNIT capsule Take 400 Units by mouth  daily.    Marland Kitchen aspirin (GOODSENSE ASPIRIN) 325 MG tablet Take 325 mg by mouth.    . clonazePAM (KLONOPIN) 0.5 MG tablet Take 0.5-1 tablets (0.25-0.5 mg total) by mouth 2 (two) times daily as needed (severe anxiety. Please print on botle: "Limit use, to prevent addiction. 45 tablets for 90 days"). 45 tablet 0   No current facility-administered medications for this visit.    No Known Allergies  PDMP reviewed during this encounter. Orders Placed This Encounter  Procedures  . Ambulatory referral to Podiatry    Referral Priority:   Routine    Referral Type:   Consultation    Referral Reason:   Specialty Services Required    Requested Specialty:   Podiatry    Number of Visits Requested:   1  . Ambulatory referral to Behavioral Health    Referral Priority:   Routine    Referral Type:   Psychiatric    Referral Reason:   Specialty Services Required    Requested Specialty:   Behavioral Health    Number of Visits Requested:   1   Meds ordered this encounter  Medications  . clonazePAM (KLONOPIN) 0.5 MG tablet    Sig: Take 0.5-1 tablets (0.25-0.5 mg total) by mouth 2 (two) times daily as needed (severe anxiety. Please print on botle: "Limit use, to prevent addiction. 45 tablets for 90 days").    Dispense:  45 tablet    Refill:  0  . DULoxetine (CYMBALTA) 60 MG capsule    Sig: Take 1 capsule (60 mg total) by mouth 2 (two) times daily.    Dispense:  90 capsule    Refill:  3

## 2018-05-30 ENCOUNTER — Telehealth: Payer: Self-pay | Admitting: Osteopathic Medicine

## 2018-05-30 NOTE — Telephone Encounter (Signed)
Left vm for patient to call to schedule her 2 wks.  follow up appointment. Thanks.

## 2018-05-31 NOTE — Telephone Encounter (Signed)
Appt scheduled

## 2018-06-12 ENCOUNTER — Ambulatory Visit (INDEPENDENT_AMBULATORY_CARE_PROVIDER_SITE_OTHER): Payer: Managed Care, Other (non HMO) | Admitting: Osteopathic Medicine

## 2018-06-12 ENCOUNTER — Encounter: Payer: Self-pay | Admitting: Osteopathic Medicine

## 2018-06-12 VITALS — BP 147/81 | HR 94 | Temp 98.2°F | Wt 238.0 lb

## 2018-06-12 DIAGNOSIS — F4321 Adjustment disorder with depressed mood: Secondary | ICD-10-CM | POA: Diagnosis not present

## 2018-06-12 DIAGNOSIS — F418 Other specified anxiety disorders: Secondary | ICD-10-CM | POA: Diagnosis not present

## 2018-06-12 DIAGNOSIS — I10 Essential (primary) hypertension: Secondary | ICD-10-CM

## 2018-06-12 DIAGNOSIS — L600 Ingrowing nail: Secondary | ICD-10-CM

## 2018-06-12 MED ORDER — VALSARTAN 320 MG PO TABS: 320.0000 mg | ORAL_TABLET | Freq: Every day | ORAL | 1 refills | Status: DC

## 2018-06-12 NOTE — Progress Notes (Signed)
HPI: Dominique Russell is a 63 y.o. female who  has a past medical history of Diabetes (HCC), Essential tremor, Fibromyalgia, and Hypertension.  she presents to [facility] today, 06/12/18,  for chief complaint of:  Follow-up anxiety   Nervous and depressed most of the day, most days. Recent death of cousin d/t COVID19 illness. I referred to behavioral health for counseling, increased Cymbalta to 60 mg bid, and started Klonopin for sparing use. She has been taking BOTH these bid on scheduled basis.... reports anxiety is better.   Would like to switch Losartan, worried about cancer risk.         At today's visit 06/12/18 ... PMH, PSH, FH reviewed and updated as needed.  Current medication list and allergy/intolerance hx reviewed and updated as needed. (See remainder of HPI, ROS, Phys Exam below)   No results found.  No results found for this or any previous visit (from the past 72 hour(s)).        ASSESSMENT/PLAN: The primary encounter diagnosis was Situational anxiety. Diagnoses of Grief reaction, Ingrown toenail, and Essential hypertension were also pertinent to this visit.  No orders of the defined types were placed in this encounter.  Meds ordered this encounter  Medications  . valsartan (DIOVAN) 320 MG tablet    Sig: Take 1 tablet (320 mg total) by mouth daily.    Dispense:  90 tablet    Refill:  1    D/C losartan    Patient Instructions  Podiatry:   We sent referral, please call [facility] at [phone]  Mental Health:  Referral was sent, I'll have them call you again to set something up   Continue Cymbalta twice per day   Please DECREASE use of the Clonazepam / Klonopin to once daily for a few days, then use sparingly 2-3 days per week maximum ONLY WHEN YOU ARE FEELING SEVERE ANXIETY OR OVERWHELMED.   Losartan:  OK to stop this medicine  Switched to Valsartan       Follow-up plan: Return  in about 3 weeks (around 07/03/2018) for recheck anxiety/mood - virtual visit is ok .                                                 ################################################# ################################################# ################################################# #################################################     No Known Allergies     Review of Systems:  Constitutional: No recent illness  HEENT: No  headache, no vision change  Cardiac: No  chest pain, No  pressure, No palpitations  Respiratory:  No  shortness of breath  Gastrointestinal: No  abdominal pain, no change on bowel habits  Musculoskeletal: No new myalgia/arthralgia  Skin: No  Rash  Hem/Onc: No  easy bruising/bleeding, No  abnormal lumps/bumps  Neurologic: No  weakness, No  Dizziness  Psychiatric: No  concerns with depression, +concerns with anxiety  Exam:  BP (!) 147/81 (BP Location: Left Arm, Patient Position: Sitting, Cuff Size: Large)   Pulse 94   Temp 98.2 F (36.8 C) (Oral)   Wt 238 lb (108 kg)   SpO2 97%   BMI 40.22 kg/m   Constitutional: VS see above. General Appearance: alert, well-developed, well-nourished, NAD  Eyes: Normal lids and conjunctive, non-icteric sclera  Neck: No masses, trachea midline.   Respiratory: Normal respiratory effort.  Musculoskeletal: Gait normal. Symmetric and independent  movement of all extremities  Neurological: Normal balance/coordination. No tremor.  Skin: warm, dry, intact.   Psychiatric: Normal judgment/insight. Normal mood and affect. Oriented x3.       Visit summary with medication list and pertinent instructions was printed for patient to review, patient was advised to alert [place] if any updates are needed. All questions at time of visit were answered - patient instructed to contact office with any additional concerns. ER/RTC precautions were reviewed with the patient and  understanding verbalized.   Note: Total time spent 25 minutes, greater than 50% of the visit was spent face-to-face counseling and coordinating care for the following: The primary encounter diagnosis was Situational anxiety. Diagnoses of Grief reaction, Ingrown toenail, and Essential hypertension were also pertinent to this visit.Marland Kitchen  Please note: voice recognition software was used to produce this document, and typos may escape review. Please contact Dr. Lyn Hollingshead for any needed clarifications.    Follow up plan: Return in about 3 weeks (around 07/03/2018) for recheck anxiety/mood - virtual visit is ok .

## 2018-06-12 NOTE — Patient Instructions (Addendum)
Podiatry:   We sent referral, please call [facility] at [phone]  Mental Health:  Referral was sent, I'll have them call you again to set something up   Continue Cymbalta twice per day   Please DECREASE use of the Clonazepam / Klonopin to once daily for a few days, then use sparingly 2-3 days per week maximum ONLY WHEN YOU ARE FEELING SEVERE ANXIETY OR OVERWHELMED.   Losartan:  OK to stop this medicine  Switched to Valsartan

## 2018-06-25 DIAGNOSIS — Z794 Long term (current) use of insulin: Secondary | ICD-10-CM | POA: Diagnosis not present

## 2018-06-25 DIAGNOSIS — E11649 Type 2 diabetes mellitus with hypoglycemia without coma: Secondary | ICD-10-CM | POA: Diagnosis not present

## 2018-06-25 DIAGNOSIS — E1142 Type 2 diabetes mellitus with diabetic polyneuropathy: Secondary | ICD-10-CM | POA: Diagnosis not present

## 2018-07-03 ENCOUNTER — Encounter: Payer: Self-pay | Admitting: Osteopathic Medicine

## 2018-07-03 ENCOUNTER — Ambulatory Visit (INDEPENDENT_AMBULATORY_CARE_PROVIDER_SITE_OTHER): Payer: Managed Care, Other (non HMO) | Admitting: Osteopathic Medicine

## 2018-07-03 VITALS — BP 142/71 | HR 105 | Wt 245.0 lb

## 2018-07-03 DIAGNOSIS — I10 Essential (primary) hypertension: Secondary | ICD-10-CM

## 2018-07-03 DIAGNOSIS — F418 Other specified anxiety disorders: Secondary | ICD-10-CM

## 2018-07-03 DIAGNOSIS — F339 Major depressive disorder, recurrent, unspecified: Secondary | ICD-10-CM

## 2018-07-03 DIAGNOSIS — G250 Essential tremor: Secondary | ICD-10-CM

## 2018-07-03 NOTE — Progress Notes (Signed)
  Virtual Visit via Phone Note  I connected with      Dominique Russell on 07/03/18 at 1:40 pm by a telemedicine application and verified that I am speaking with the correct person using two identifiers.  Patient is at home I am in office    I discussed the limitations of evaluation and management by telemedicine and the availability of in person appointments. The patient expressed understanding and agreed to proceed.  History of Present Illness: Dominique Russell is a 62 y.o. female who would like to discuss anxiety follow-up    Nervous and depressed most of the day, most days. Recent death of cousin d/t COVID19 illness. Initially we referred to behavioral health for counseling, increased Cymbalta to 60 mg bid, and started Klonopin for sparing use. She had been taking BOTH these bid on scheduled basis.... reported anxiety was better. I advised last visit the Clonazepam was for severe symptoms ONLY and NOT for routine use.   She has stopped the Clonazepam d/t increased tremors. She reports the tremors are maybe about the same. She has seen neurology in the past for tremors   Still taking the Cymbalta. She feels overall that moods have been a little down lately but feeling a little bit better compared to few weeks ago prior to increasing the dose of this medicine.. [facility].    Depression screen PHQ 2/9 07/03/2018 10/26/2016 09/14/2016  Decreased Interest 1 2 2  Down, Depressed, Hopeless 3 3 3  PHQ - 2 Score 4 5 5  Altered sleeping 2 2 3  Tired, decreased energy 2 3 3  Change in appetite 3 3 3  Feeling bad or failure about yourself  0 2 1  Trouble concentrating 3 3 3  Moving slowly or fidgety/restless 2 2 3  Suicidal thoughts 0 0 0  PHQ-9 Score 16 20 21  Difficult doing work/chores Somewhat difficult - -   GAD 7 : Generalized Anxiety Score 07/03/2018  Nervous, Anxious, on Edge 3  Control/stop worrying 3  Worry too much - different things 3  Trouble  relaxing 3  Restless 3  Easily annoyed or irritable 2  Afraid - awful might happen 3  Total GAD 7 Score 20  Anxiety Difficulty Somewhat difficult          Observations/Objective: BP (!) 142/71   Pulse (!) 105   Wt 245 lb (111.1 kg)   BMI 41.40 kg/m  BP Readings from Last 3 Encounters:  07/03/18 (!) 142/71  06/12/18 (!) 147/81  05/29/18 107/74   Exam: Normal Speech.  NAD  Lab and Radiology Results No results found for this or any previous visit (from the past 72 hour(s)). No results found.     Assessment and Plan: 62 y.o. female with The primary encounter diagnosis was Situational anxiety. Diagnoses of Essential hypertension, Depression, recurrent (HCC), and Essential tremor were also pertinent to this visit.  Patient was advised I think we can leave medications as is for now.  Will take the clonazepam off of her list.  Doubt this was contributing to tremors, patient was advised to contact her neurologist to talk about symptom management/medication adjustment.  [facility], unclear if for counseling versus psychiatric medication management.  Patient is advised when she goes to this appointment to let them know to send records to this office.  PDMP not reviewed this encounter. No orders of the defined types were placed in this encounter.    No orders of the defined types were placed in this encounter.  There are no Patient Instructions on file for this visit.  Instructions sent via MyChart. If MyChart not available, pt was given option for info via personal e-mail w/ no guarantee of protected health info over unsecured e-mail communication, and MyChart sign-up instructions were included.   Follow Up Instructions: Return if symptoms worsen or fail to improve.    I discussed the assessment and treatment plan with the patient. The patient was provided an opportunity to ask questions and all were answered. The patient agreed with the plan  and demonstrated an understanding of the instructions.   The patient was advised to call back or seek an in-person evaluation if any new concerns, if symptoms worsen or if the condition fails to improve as anticipated.  30 minutes of non-face-to-face time was provided during this encounter.                      Historical information moved to improve visibility of documentation.  Past Medical History:  Diagnosis Date  . Diabetes ([place])   . Essential tremor   . Fibromyalgia   . Hypertension    Past Surgical History:  Procedure Laterality Date  . BREAST REDUCTION SURGERY     Social History   Tobacco Use  . Smoking status: Former Smoker    Last attempt to quit: 02/22/1995    Years since quitting: 23.3  . Smokeless tobacco: Never Used  Substance Use Topics  . Alcohol use: No    Comment: 1-2 times per month   family history includes Depression in her mother; Hypertension in her mother; Kidney failure in her brother; Other in her sister.  Medications: Current Outpatient Medications  Medication Sig Dispense Refill  . acetaminophen (TYLENOL) 650 MG CR tablet Take 650 mg by mouth every 8 (eight) hours as needed for pain.    Marland Kitchen aspirin (GOODSENSE ASPIRIN) 325 MG tablet Take 325 mg by mouth.    Marland Kitchen atorvastatin (LIPITOR) 20 MG tablet Take 1 tablet (20 mg total) by mouth every other day. 45 tablet 3  . B-D UF III MINI PEN NEEDLES 31G X 5 MM MISC USE AS DIRECTED WITH INSULIN AND VICTOZA.  11  . Cholecalciferol (VITAMIN D-3) 25 MCG (1000 UT) CAPS Take 2 capsules (2,000 Units total) by mouth daily. 180 capsule 3  . clobetasol ointment (TEMOVATE) 0.99 % Apply 1 application topically 2 (two) times daily. To affected area(s) as needed, max 2-3 weeks to avoid whitening/thinning skin 45 g 1  . clonazePAM (KLONOPIN) 0.5 MG tablet Take 0.5-1 tablets (0.25-0.5 mg total) by mouth 2 (two) times daily as needed (severe anxiety. Please print on botle: "Limit use, to prevent addiction. 45  tablets for 90 days"). 45 tablet 0  . diclofenac sodium (VOLTAREN) 1 % GEL Place onto the skin.    . Fluocinolone Acetonide 0.01 % OIL Place 5 drops in ear(s) 2 (two) times daily as needed. For itching ear up to 2 weeks 20 mL 1  . fluticasone (FLONASE) 50 MCG/ACT nasal spray Place 1-2 sprays into both nostrils daily. 48 g 3  . folic acid (FOLVITE) 833 MCG tablet Take 400 mcg by mouth daily.    . hydrochlorothiazide (HYDRODIURIL) 25 MG tablet Take 1 tablet (25 mg total) by mouth daily. 90 tablet 3  . insulin aspart protamine- aspart (NOVOLOG MIX 70/30) (70-30) 100 UNIT/ML injection Inject into the skin.    Marland Kitchen liraglutide (VICTOZA) 18 MG/3ML SOPN  Inject 1.8 mg into the skin.    Marland Kitchen LYRICA 150 MG capsule Take 1 tablet by mouth 2 (two) times daily.     . meloxicam (MOBIC) 15 MG tablet     . Omega-3 Fatty Acids (FISH OIL) 1000 MG CAPS Take by mouth.    Marland Kitchen omeprazole (PRILOSEC) 40 MG capsule Take 1 capsule (40 mg total) by mouth daily. 90 capsule 1  . ONE TOUCH ULTRA TEST test strip     . pioglitazone-metformin (ACTOPLUS MET) 15-850 MG per tablet Take 1 tablet by mouth 2 (two) times daily with a meal.    . primidone (MYSOLINE) 50 MG tablet TAKE 1 TABLET BY MOUTH TWICE A DAY 180 tablet 0  . simethicone (MYLICON) 80 MG chewable tablet Chew 80 mg every 6 (six) hours as needed by mouth for flatulence.    Marland Kitchen tiZANidine (ZANAFLEX) 2 MG tablet TAKE 1 TABLET (2 MG TOTAL) BY MOUTH 3 (THREE) TIMES DAILY AS NEEDED-CAPSULES ARE NOT COVERED. 90 tablet 0  . traZODone (DESYREL) 150 MG tablet Take 1 tablet (150 mg total) by mouth at bedtime. 90 tablet 3  . trihexyphenidyl (ARTANE) 2 MG tablet Take 1 tablet (2 mg total) by mouth daily. 90 tablet 1  . valsartan (DIOVAN) 320 MG tablet Take 1 tablet (320 mg total) by mouth daily. 90 tablet 1  . vitamin B-12 (CYANOCOBALAMIN) 1000 MCG tablet Take by mouth.    . vitamin E 400 UNIT capsule Take 400 Units by mouth daily.    . DULoxetine (CYMBALTA) 60 MG capsule Take 1 capsule (60  mg total) by mouth 2 (two) times daily. (Patient not taking: Reported on 07/03/2018) 90 capsule 3   No current facility-administered medications for this visit.    No Known Allergies  PDMP not reviewed this encounter. No orders of the defined types were placed in this encounter.  No orders of the defined types were placed in this encounter.

## 2018-07-04 DIAGNOSIS — N183 Chronic kidney disease, stage 3 (moderate): Secondary | ICD-10-CM | POA: Diagnosis not present

## 2018-07-04 DIAGNOSIS — I129 Hypertensive chronic kidney disease with stage 1 through stage 4 chronic kidney disease, or unspecified chronic kidney disease: Secondary | ICD-10-CM | POA: Diagnosis not present

## 2018-07-05 ENCOUNTER — Other Ambulatory Visit: Payer: Self-pay | Admitting: Osteopathic Medicine

## 2018-07-06 NOTE — Progress Notes (Signed)
Virtual Visit via Video Note The purpose of this virtual visit is to provide medical care while limiting exposure to the novel coronavirus.    Consent was obtained for video visit:  Yes.   Answered questions that patient had about telehealth interaction:  Yes.   I discussed the limitations, risks, security and privacy concerns of performing an evaluation and management service by telemedicine. I also discussed with the patient that there may be a patient responsible charge related to this service. The patient expressed understanding and agreed to proceed.  Pt location: Home Physician Location: office Name of referring provider:  [name], DO I connected with [name] at patients initiation/request on 07/10/2018 at  2:00 PM EDT by video enabled telemedicine application and verified that I am speaking with the correct person using two identifiers. Pt MRN:  [id number] Pt DOB:  07-08-1955 Video Participants:  [name];  Patients daughter   History of Present Illness:  Patient seen today in follow-up for tremor.  I have not seen her in over a year.  At that time, she was on primidone, 50 mg twice per day.  We cautiously started her on trihexyphenidyl, 2 mg in the morning.  We called her back several times to see how she was doing on this medication, but she never returned our phone calls.  States today "that didn't work or help."  Took it x 3 months and d/c it.   Tremor is getting worse.  She notes it when trying to hold her coffee.  She uses lids now because of it.   Daughter states that her head is like a "bobble head."  Daughter states that it mostly shakes in the "yes" direction.  Patient did see Dr. Si Raider last year for neurocognitive testing.  Patient has already received the results of this.  I have reviewed it.  There was evidence of mild dementia, possibly vascular.  Cognitive profile did not suggest Alzheimer's disease.  She did think that cognitive deficit were  exacerbated by severe depression and anxiety and psychiatry and psychology was recommended.  Dr. Si Raider [facility].  She admits that she didn't go but has an appointment on 5/28.    Current/Previously tried tremor medications: primidone, topamax, gabapentin (was on it for neuropathy); lyrica; artane  Current Outpatient Medications on File Prior to Visit  Medication Sig Dispense Refill   acetaminophen (TYLENOL) 650 MG CR tablet Take 650 mg by mouth every 8 (eight) hours as needed for pain.     atorvastatin (LIPITOR) 20 MG tablet Take 1 tablet (20 mg total) by mouth every other day. 45 tablet 3   B-D UF III MINI PEN NEEDLES 31G X 5 MM MISC USE AS DIRECTED WITH INSULIN AND VICTOZA.  11   Cholecalciferol (VITAMIN D-3) 25 MCG (1000 UT) CAPS Take 2 capsules (2,000 Units total) by mouth daily. 180 capsule 3   clobetasol ointment (TEMOVATE) 5.62 % Apply 1 application topically 2 (two) times daily. To affected area(s) as needed, max 2-3 weeks to avoid whitening/thinning skin 45 g 1   diclofenac sodium (VOLTAREN) 1 % GEL Place onto the skin.     DULoxetine (CYMBALTA) 60 MG capsule Take 1 capsule (60 mg total) by mouth 2 (two) times daily. 90 capsule 3   Fluocinolone Acetonide 0.01 % OIL Place 5 drops in ear(s) 2 (two) times daily as needed. For itching ear up to 2 weeks 20 mL 1   fluticasone (FLONASE) 50  MCG/ACT nasal spray Place 1-2 sprays into both nostrils daily. 48 g 3   folic acid (FOLVITE) 327 MCG tablet Take 400 mcg by mouth daily.     hydrochlorothiazide (HYDRODIURIL) 25 MG tablet Take 1 tablet (25 mg total) by mouth daily. 90 tablet 3   insulin aspart protamine- aspart (NOVOLOG MIX 70/30) (70-30) 100 UNIT/ML injection Inject into the skin.     liraglutide (VICTOZA) 18 MG/3ML SOPN Inject 1.8 mg into the skin.     LYRICA 150 MG capsule Take 1 tablet by mouth 2 (two) times daily.      Omega-3 Fatty Acids (FISH OIL) 1000 MG CAPS Take by mouth.      omeprazole (PRILOSEC) 40 MG capsule Take 1 capsule (40 mg total) by mouth daily. 90 capsule 1   ONE TOUCH ULTRA TEST test strip      pioglitazone-metformin (ACTOPLUS MET) 15-850 MG per tablet Take 1 tablet by mouth 2 (two) times daily with a meal.     primidone (MYSOLINE) 50 MG tablet TAKE 1 TABLET BY MOUTH TWICE A DAY 180 tablet 0   simethicone (MYLICON) 80 MG chewable tablet Chew 80 mg every 6 (six) hours as needed by mouth for flatulence.     traZODone (DESYREL) 150 MG tablet Take 1 tablet (150 mg total) by mouth at bedtime. 90 tablet 3   trihexyphenidyl (ARTANE) 2 MG tablet Take 1 tablet (2 mg total) by mouth daily. 90 tablet 1   valsartan (DIOVAN) 320 MG tablet Take 1 tablet (320 mg total) by mouth daily. 90 tablet 1   vitamin B-12 (CYANOCOBALAMIN) 1000 MCG tablet Take by mouth.     vitamin E 400 UNIT capsule Take 400 Units by mouth daily.     No current facility-administered medications on file prior to visit.       Observations/Objective:   Vitals:   07/10/18 1332  BP: 101/63  Pulse: (!) 103  Weight: 245 lb (111.1 kg)  Height: 5' 4"  (1.626 m)   GEN:  The patient appears stated age and is in NAD.  Neurological examination:  Orientation:  Montreal Cognitive Assessment  07/10/2018  Visuospatial/ Executive (0/5) 2  Naming (0/3) 3  Attention: Read list of digits (0/2) 2  Attention: Read list of letters (0/1) 0  Attention: Serial 7 subtraction starting at 100 (0/3) 1  Language: Repeat phrase (0/2) 1  Language : Fluency (0/1) 0  Abstraction (0/2) 1  Delayed Recall (0/5) 0  Orientation (0/6) 4  Total 14  Adjusted Score (based on education) 15   Cranial nerves: There is good facial symmetry. There is no facial hypomimia.  The speech is fluent and clear. Soft palate rises symmetrically and there is no tongue deviation. Hearing is intact to conversational tone. Motor: Strength is at least antigravity x 4.   Shoulder shrug is equal and symmetric.  There is no pronator  drift.  Movement examination: Tone: unable Abnormal movements: There is postural tremor L more than R.  R is very mild and L is at least mod.  she has significant difficulty with archimedes spirals on the L.  There is tremor on the right. Coordination:  There is no decremation with RAM's Gait and Station: not tested  Lab Results  Component Value Date   [id number] 01/31/2018   Lab Results  Component Value Date   TSH 0.94 01/31/2018     Assessment and Plan:   1.  Essential Tremor.             -  Long discussion with the patient and her daughter today.  She thought that propranolol made tremor worse (not convinced).  She told me in the past that primidone at higher dosages caused side effects, but when we reviewed her history, she was on Topamax at the time and it was likely the Topamax causing memory change.  Her daughter agreed with that and stated that she was "mean" on Topamax and when that was discontinued her demeanor and her memory got much better.  We decided to go ahead and increase the primidone 50 mg, 2 tablets in the morning and 1 tablet at night.  I did tell her that this was not going to get rid of all the tremor in the left hand and certainly was not going to get rid of the tremor in the head.  Unfortunately, she is not a surgical candidate because of memory.   2.    dementia  -Patient had neurocognitive testing in March, 2019.  There was evidence of mild dementia, with superimposed evidence of severe depression and anxiety.  Dr. Si Raider [facility]. She didn't go then but has an appointment in 2 weeks.  We did not want to add anything right now, because I do think her depression plays a significant role into her cognitive change.  Her daughter agrees as well.  Follow Up Instructions: 5 months, sooner should new neurologic issues arise   -I discussed the assessment and treatment plan with the patient. The patient was provided an opportunity to ask  questions and all were answered. The patient agreed with the plan and demonstrated an understanding of the instructions.   The patient was advised to call back or seek an in-person evaluation if the symptoms worsen or if the condition fails to improve as anticipated.    Total Time spent in visit with the patient was: 25 minutes (which did not include the multiple times we had the connected back into the video), of which more than 50% of the time was spent in counseling and/or coordinating care on safety associated with medicine.   Pt understands and agrees with the plan of care outlined.     Alonza Bogus, DO

## 2018-07-09 ENCOUNTER — Other Ambulatory Visit: Payer: Self-pay | Admitting: Nephrology

## 2018-07-09 DIAGNOSIS — N183 Chronic kidney disease, stage 3 unspecified: Secondary | ICD-10-CM

## 2018-07-10 ENCOUNTER — Encounter: Payer: Self-pay | Admitting: Neurology

## 2018-07-10 ENCOUNTER — Telehealth (INDEPENDENT_AMBULATORY_CARE_PROVIDER_SITE_OTHER): Payer: Managed Care, Other (non HMO) | Admitting: Neurology

## 2018-07-10 ENCOUNTER — Other Ambulatory Visit: Payer: Self-pay

## 2018-07-10 DIAGNOSIS — F0150 Vascular dementia without behavioral disturbance: Secondary | ICD-10-CM | POA: Diagnosis not present

## 2018-07-10 DIAGNOSIS — G250 Essential tremor: Secondary | ICD-10-CM

## 2018-07-10 MED ORDER — PRIMIDONE 50 MG PO TABS: ORAL_TABLET | ORAL | 1 refills | Status: DC

## 2018-07-10 NOTE — Progress Notes (Signed)
MOCA charted Copy Given to provider for appt today

## 2018-07-11 ENCOUNTER — Ambulatory Visit
Admission: RE | Admit: 2018-07-11 | Discharge: 2018-07-11 | Disposition: A | Discharge status: Home / Self Care | Payer: Managed Care, Other (non HMO) | Source: Ambulatory Visit | Attending: Nephrology | Admitting: Nephrology

## 2018-07-11 DIAGNOSIS — N189 Chronic kidney disease, unspecified: Secondary | ICD-10-CM | POA: Diagnosis not present

## 2018-07-11 DIAGNOSIS — N183 Chronic kidney disease, stage 3 unspecified: Secondary | ICD-10-CM

## 2018-07-20 LAB — VITAMIN D 25 HYDROXY (VIT D DEFICIENCY, FRACTURES): Vit D, 25-Hydroxy: 21

## 2018-07-20 LAB — BASIC METABOLIC PANEL
BUN: 24 — AB (ref 4–21)
Creatinine: 1.2 — AB (ref 0.5–1.1)
Glucose: 84

## 2018-07-20 LAB — VITAMIN B12: Vitamin B-12: 1055

## 2018-07-20 LAB — CBC AND DIFFERENTIAL
HCT: 36 (ref 36–46)
Hemoglobin: 12 (ref 12.0–16.0)
Platelets: 245 (ref 150–399)
WBC: 5.3

## 2018-07-20 LAB — TSH: TSH: 0.78 (ref 0.41–5.90)

## 2018-07-20 LAB — IRON,TIBC AND FERRITIN PANEL: Ferritin: 98

## 2018-07-27 ENCOUNTER — Encounter: Payer: Self-pay | Admitting: Sports Medicine

## 2018-07-27 ENCOUNTER — Telehealth: Payer: Self-pay

## 2018-07-27 ENCOUNTER — Ambulatory Visit (INDEPENDENT_AMBULATORY_CARE_PROVIDER_SITE_OTHER): Payer: Managed Care, Other (non HMO) | Admitting: Sports Medicine

## 2018-07-27 ENCOUNTER — Other Ambulatory Visit: Payer: Self-pay

## 2018-07-27 ENCOUNTER — Ambulatory Visit (INDEPENDENT_AMBULATORY_CARE_PROVIDER_SITE_OTHER): Payer: Managed Care, Other (non HMO)

## 2018-07-27 DIAGNOSIS — M545 Low back pain, unspecified: Secondary | ICD-10-CM

## 2018-07-27 MED ORDER — DEXAMETHASONE 4 MG PO TABS: 4.0000 mg | ORAL_TABLET | Freq: Three times a day (TID) | ORAL | 0 refills | Status: DC

## 2018-07-27 MED ORDER — CYCLOBENZAPRINE HCL 10 MG PO TABS: ORAL_TABLET | ORAL | 0 refills | Status: DC

## 2018-07-27 NOTE — Progress Notes (Signed)
Subjective:    CC: Low back pain  HPI: This is a pleasant 63 year old female, recently she turned to the left to pick up her purse, felt a sharp pain in her low back with radiation to the buttock, now she has pain with moderate, persistent, back to buttock, worse with sitting, flexion, Valsalva, no bowel or bladder dysfunction, saddle numbness, constitutional symptoms.  I reviewed the past medical history, family history, social history, surgical history, and allergies today and no changes were needed.  Please see the problem list section below in epic for further details.  Past Medical History: Past Medical History:  Diagnosis Date  . Diabetes (HCC)   . Essential tremor   . Fibromyalgia   . Hypertension    Past Surgical History: Past Surgical History:  Procedure Laterality Date  . BREAST REDUCTION SURGERY     Social History: Social History   Socioeconomic History  . Marital status: Legally Separated    Spouse name: Not on file  . Number of children: 2  . Years of education: HS  . Highest education level: Not on file  Occupational History  . Not on file  Social Needs  . Financial resource strain: Not on file  . Food insecurity:    Worry: Not on file    Inability: Not on file  . Transportation needs:    Medical: Not on file    Non-medical: Not on file  Tobacco Use  . Smoking status: Former Smoker    Last attempt to quit: 02/22/1995    Years since quitting: 23.4  . Smokeless tobacco: Never Used  Substance and Sexual Activity  . Alcohol use: No    Comment: 1-2 times per month  . Drug use: No  . Sexual activity: Not on file  Lifestyle  . Physical activity:    Days per week: Not on file    Minutes per session: Not on file  . Stress: Not on file  Relationships  . Social connections:    Talks on phone: Not on file    Gets together: Not on file    Attends religious service: Not on file    Active member of club or organization: Not on file    Attends meetings of  clubs or organizations: Not on file    Relationship status: Not on file  Other Topics Concern  . Not on file  Social History Narrative   Patient lives at home with her daughter.   Caffeine Use: drinks regurlarly   Family History: Family History  Problem Relation Age of Onset  . Depression Mother   . Hypertension Mother   . Kidney failure Brother   . Other Sister        MVA  . Tremor Neg Hx    Allergies: No Known Allergies Medications: See med rec.  Review of Systems: No fevers, chills, night sweats, weight loss, chest pain, or shortness of breath.   Objective:    General: Well Developed, well nourished, and in no acute distress.  Neuro: Alert and oriented x3, extra-ocular muscles intact, sensation grossly intact.  HEENT: Normocephalic, atraumatic, pupils equal round reactive to light, neck supple, no masses, no lymphadenopathy, thyroid nonpalpable.  Skin: Warm and dry, no rashes. Cardiac: Regular rate and rhythm, no murmurs rubs or gallops, no lower extremity edema.  Respiratory: Clear to auscultation bilaterally. Not using accessory muscles, speaking in full sentences. Back Exam:  Inspection: Unremarkable  Motion: Flexion 45 deg, Extension 45 deg, Side Bending to 45 deg bilaterally,  Rotation to 45 deg bilaterally  SLR laying: Negative  XSLR laying: Negative  Palpable tenderness: None. FABER: negative. Sensory change: Gross sensation intact to all lumbar and sacral dermatomes.  Reflexes: 2+ at both patellar tendons, 2+ at achilles tendons, Babinski's downgoing.  Strength at foot  Plantar-flexion: 5/5 Dorsi-flexion: 5/5 Eversion: 5/5 Inversion: 5/5  Leg strength  Quad: 5/5 Hamstring: 5/5 Hip flexor: 5/5 Hip abductors: 5/5  Gait unremarkable.  Impression and Recommendations:    Low back pain Acute left-sided low back pain. Nothing radicular. Adding x-rays, Decadron, cyclobenzaprine. Formal physical therapy. Return to see me in 4 weeks, MR for interventional  planning if no better.   ___________________________________________ Ihor Austin. Benjamin Stain, M.D., ABFM., CAQSM. Primary Care and Sports Medicine [facility]  Adjunct Professor of Family Medicine  [employer]

## 2018-07-27 NOTE — Telephone Encounter (Signed)
Patient scheduled.

## 2018-07-27 NOTE — Assessment & Plan Note (Signed)
Acute left-sided low back pain. Nothing radicular. Adding x-rays, Decadron, cyclobenzaprine. Formal physical therapy. Return to see me in 4 weeks, MR for interventional planning if no better.

## 2018-08-01 ENCOUNTER — Ambulatory Visit (INDEPENDENT_AMBULATORY_CARE_PROVIDER_SITE_OTHER): Payer: Managed Care, Other (non HMO) | Admitting: Osteopathic Medicine

## 2018-08-01 ENCOUNTER — Encounter: Payer: Self-pay | Admitting: Osteopathic Medicine

## 2018-08-01 VITALS — BP 125/73 | HR 101 | Temp 98.2°F | Wt 235.6 lb

## 2018-08-01 DIAGNOSIS — F418 Other specified anxiety disorders: Secondary | ICD-10-CM

## 2018-08-01 DIAGNOSIS — N183 Chronic kidney disease, stage 3 unspecified: Secondary | ICD-10-CM

## 2018-08-01 DIAGNOSIS — Z23 Encounter for immunization: Secondary | ICD-10-CM | POA: Diagnosis not present

## 2018-08-01 DIAGNOSIS — I10 Essential (primary) hypertension: Secondary | ICD-10-CM | POA: Diagnosis not present

## 2018-08-01 DIAGNOSIS — E118 Type 2 diabetes mellitus with unspecified complications: Secondary | ICD-10-CM

## 2018-08-01 DIAGNOSIS — G250 Essential tremor: Secondary | ICD-10-CM | POA: Diagnosis not present

## 2018-08-01 DIAGNOSIS — M797 Fibromyalgia: Secondary | ICD-10-CM

## 2018-08-01 DIAGNOSIS — F339 Major depressive disorder, recurrent, unspecified: Secondary | ICD-10-CM

## 2018-08-01 DIAGNOSIS — G4700 Insomnia, unspecified: Secondary | ICD-10-CM

## 2018-08-01 DIAGNOSIS — Z794 Long term (current) use of insulin: Secondary | ICD-10-CM

## 2018-08-01 MED ORDER — BUPROPION HCL ER (XL) 150 MG PO TB24: 150.0000 mg | ORAL_TABLET | ORAL | 0 refills | Status: DC

## 2018-08-01 NOTE — Patient Instructions (Signed)
Plan: Okay for blood work for now.  We will plan to recheck everything when I see you in 6 months for your annual.  If you need me sooner, please give [place] a call!

## 2018-08-01 NOTE — Progress Notes (Signed)
HPI: Dominique Russell is a 63 y.o. female who  has a past medical history of Diabetes ([place]), Essential tremor, Fibromyalgia, and Hypertension.  she presents to [facility] today, 08/01/18,  for chief complaint of:  Need shingles shot  Routine 6 mos f/u    CARDIOVASCULAR  Hyperlipidemia: Taking atorvastatin 20 mg every other day, last lipids were checked 01/2018, DL was 72, triglycerides elevated at 197, HDL at 48.  Patient is also taking fish oil  Hypertension: Well-controlled on current medications - valsartan 325 mg daily, hydrochlorothiazide 25 mg daily BP Readings from Last 3 Encounters:  07/27/18 116/75  07/10/18 101/63  07/03/18 (!) 142/71    RESPIRATORY  NEUROLOGICAL/PSYCHIATRIC  Mental health: Patient was suffering from some situational anxiety issues recently due to family member's death from [id number] complications. Acc to neuro notes, cognitive deficit also likely exacerbated by severe depression and anxiety, [facility] but she did not go to an appointment.  She ha another appointment in 2 weeks ACcording to most recent neurology notes from 07/10/2018.  She states that she went to this visit, they did not make any changes to current medications but they did add Wellbutrin.  Tremor: Following with neurology.  Telemedicine visit 07/10/2018.  Primidone was increased, 50 mg 2 tablets in the morning and 1 tablet at night.  Mild dementia: Neurocognitive work-up by neurology in 2019.  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    Insomnia: We have the patient on trazodone 150 mg at bedtime.  RENAL  Chronic kidney disease, creatinine last checked 05/04/2018,, GFR 40. Stable from 01/2018  ENDOCRINE  Type 2 diabetes, following with endocrinology.  Last visit 06/25/2018, A1c is low concerning for hypoglycemic episodes though patient does not have symptoms of these, recommended  continuous glucose monitoring.  History of poor compliance with metformin.  Patient is taking Victoza, Mart brand insulin if 45 to 50 units twice daily.  Vies follow-up in 3 months.  MUSCULOSKELETAL/RHEUM  Chronic pain syndrome, following with [facility] but last office visit was 05/16/2018.  Sent physician Dr. Darene Lamer here in the office, last visit was about 5 days ago.       At today's visit 08/01/18 ... PMH, PSH, FH reviewed and updated as needed.  Current medication list and allergy/intolerance hx reviewed and updated as needed. (See remainder of HPI, ROS, Phys Exam below)          ASSESSMENT/PLAN: The primary encounter diagnosis was Essential hypertension. Diagnoses of Need for varicella vaccine, Situational anxiety, Depression, recurrent ([place]), Essential tremor, CKD (chronic kidney disease) stage 3, GFR 30-59 ml/min (HCC), Type 2 diabetes mellitus with complication, with long-term current use of insulin ([place]), Insomnia, unspecified type, and Fibromyalgia were also pertinent to this visit.   Orders Placed This Encounter  Procedures  . Varicella-zoster vaccine IM (Shingrix)     Meds ordered this encounter  Medications  . buPROPion (WELLBUTRIN XL) 150 MG 24 hr tablet    Sig: Take 1 tablet (150 mg total) by mouth every morning.    Dispense:  90 tablet    Refill:  0    Patient Instructions  Plan: Okay for blood work for now.  We will plan to recheck everything when I see you in 6 months for your annual.  If you need me sooner, please give [place] a call!      Follow-up plan: Return in about 6 months (around 01/31/2019) for Annual  physical, please schedule fasting so we can get blood  work..                                                 ################################################# ################################################# ################################################# #################################################    Current Meds  Medication Sig  . acetaminophen (TYLENOL) 650 MG CR tablet Take 650 mg by mouth every 8 (eight) hours as needed for pain.  Marland Kitchen atorvastatin (LIPITOR) 20 MG tablet Take 1 tablet (20 mg total) by mouth every other day.  . B-D UF III MINI PEN NEEDLES 31G X 5 MM MISC USE AS DIRECTED WITH INSULIN AND VICTOZA.  Marland Kitchen Cholecalciferol (VITAMIN D-3) 25 MCG (1000 UT) CAPS Take 2 capsules (2,000 Units total) by mouth daily.  . clobetasol ointment (TEMOVATE) 3.14 % Apply 1 application topically 2 (two) times daily. To affected area(s) as needed, max 2-3 weeks to avoid whitening/thinning skin  . cyclobenzaprine (FLEXERIL) 10 MG tablet One half tab PO qHS, then increase gradually to one tab TID.  Marland Kitchen dexamethasone (DECADRON) 4 MG tablet Take 1 tablet (4 mg total) by mouth 3 (three) times daily.  . diclofenac sodium (VOLTAREN) 1 % GEL Place onto the skin.  . DULoxetine (CYMBALTA) 60 MG capsule Take 1 capsule (60 mg total) by mouth 2 (two) times daily.  . Fluocinolone Acetonide 0.01 % OIL Place 5 drops in ear(s) 2 (two) times daily as needed. For itching ear up to 2 weeks  . fluticasone (FLONASE) 50 MCG/ACT nasal spray Place 1-2 sprays into both nostrils daily.  . folic acid (FOLVITE) 970 MCG tablet Take 400 mcg by mouth daily.  . hydrochlorothiazide (HYDRODIURIL) 25 MG tablet Take 1 tablet (25 mg total) by mouth daily.  . insulin aspart protamine- aspart (NOVOLOG MIX 70/30) (70-30) 100 UNIT/ML injection Inject into the skin.  Marland Kitchen liraglutide (VICTOZA) 18 MG/3ML SOPN Inject 1.8 mg into the skin.  Marland Kitchen LYRICA 150 MG capsule Take 1 tablet by mouth 2 (two) times daily.   . Omega-3 Fatty Acids (FISH OIL) 1000  MG CAPS Take by mouth.  Marland Kitchen omeprazole (PRILOSEC) 40 MG capsule Take 1 capsule (40 mg total) by mouth daily.  . ONE TOUCH ULTRA TEST test strip   . pioglitazone-metformin (ACTOPLUS MET) 15-850 MG per tablet Take 1 tablet by mouth 2 (two) times daily with a meal.  . primidone (MYSOLINE) 50 MG tablet 2 in the AM, 1 at night  . simethicone (MYLICON) 80 MG chewable tablet Chew 80 mg every 6 (six) hours as needed by mouth for flatulence.  . traZODone (DESYREL) 150 MG tablet Take 1 tablet (150 mg total) by mouth at bedtime.  . valsartan (DIOVAN) 320 MG tablet Take 1 tablet (320 mg total) by mouth daily.  . vitamin B-12 (CYANOCOBALAMIN) 1000 MCG tablet Take by mouth.  . vitamin E 400 UNIT capsule Take 400 Units by mouth daily.    No Known Allergies     Review of Systems:  Constitutional: No recent illness  HEENT: No  headache, no vision change  Cardiac: No  chest pain, No  pressure, No palpitations  Respiratory:  No  shortness of breath. No  Cough  Gastrointestinal: No  abdominal pain, no change on bowel habits  Skin: No  Rash  Neurologic: No  weakness, No  Dizziness  Psychiatric: +concerns with depression, +concerns with anxiety  Exam:  BP 125/73  Pulse (!) 101   Temp 98.2 F (36.8 C)   Wt 235 lb 9.6 oz (106.9 kg)   BMI 40.44 kg/m   Constitutional: VS see above. General Appearance: alert, well-developed, well-nourished, NAD  Eyes: Normal lids and conjunctive, non-icteric sclera  Ears, Nose, Mouth, Throat: MMM, Normal external inspection ears/nares/mouth/lips/gums.  Neck: No masses, trachea midline.   Respiratory: Normal respiratory effort. no wheeze, no rhonchi, no rales  Cardiovascular: S1/S2 normal, no murmur, no rub/gallop auscultated. RRR.   Musculoskeletal: Gait normal. Symmetric and independent movement of all extremities  Neurological: Normal balance/coordination. No tremor.  Skin: warm, dry, intact.   Psychiatric: Normal judgment/insight. Normal mood and  affect. Oriented x3.       Visit summary with medication list and pertinent instructions was printed for patient to review, patient was advised to alert [place] if any updates are needed. All questions at time of visit were answered - patient instructed to contact office with any additional concerns. ER/RTC precautions were reviewed with the patient and understanding verbalized.   Note: Total time spent 25 minutes, greater than 50% of the visit was spent face-to-face counseling and coordinating care for the following: The primary encounter diagnosis was Essential hypertension. Diagnoses of Need for varicella vaccine, Situational anxiety, Depression, recurrent ([place]), Essential tremor, CKD (chronic kidney disease) stage 3, GFR 30-59 ml/min (HCC), Type 2 diabetes mellitus with complication, with long-term current use of insulin ([place]), Insomnia, unspecified type, and Fibromyalgia were also pertinent to this visit.Marland Kitchen  Please note: voice recognition software was used to produce this document, and typos may escape review. Please contact Dr. Sheppard Coil for any needed clarifications.    Follow up plan: Return in about 6 months (around 01/31/2019) for Annual physical, please schedule fasting so we can get blood work.Marland Kitchen

## 2018-08-02 ENCOUNTER — Ambulatory Visit: Payer: Managed Care, Other (non HMO) | Admitting: Rehabilitative and Restorative Service Providers"

## 2018-08-08 ENCOUNTER — Telehealth: Payer: Self-pay

## 2018-08-08 DIAGNOSIS — M549 Dorsalgia, unspecified: Secondary | ICD-10-CM | POA: Diagnosis not present

## 2018-08-08 DIAGNOSIS — G894 Chronic pain syndrome: Secondary | ICD-10-CM | POA: Diagnosis not present

## 2018-08-08 DIAGNOSIS — M25561 Pain in right knee: Secondary | ICD-10-CM | POA: Diagnosis not present

## 2018-08-08 DIAGNOSIS — M170 Bilateral primary osteoarthritis of knee: Secondary | ICD-10-CM | POA: Diagnosis not present

## 2018-08-08 NOTE — Telephone Encounter (Signed)
Pt left a vm msg stating that the Wellbutrin rx is not working. Pt is going to be traveling for 2 weeks and is requesting an anxiety rx. As per pt, she needs something to calm her nerves and the Wellbutrin does not help at all. If appropriate, pls send rx to [facility].

## 2018-08-09 MED ORDER — BUPROPION HCL ER (XL) 300 MG PO TB24: 300.0000 mg | ORAL_TABLET | ORAL | 3 refills | Status: DC

## 2018-08-09 NOTE — Telephone Encounter (Signed)
Left message for patient to call back with PCP recommendations.

## 2018-08-09 NOTE — Telephone Encounter (Signed)
Unless travel is absolutely necessary, given the current pandemic I would advise that she does not.  If she decides to go, would be consistent about wearing masks, distancing from others, frequent handwashing.  At any rate, I think we can increase the Wellbutrin from 150 mg to 300 mg, she can double up on the pills she has until she is out and I sent a new prescription to the pharmacy.

## 2018-08-14 ENCOUNTER — Ambulatory Visit: Payer: Managed Care, Other (non HMO) | Admitting: Physical Therapy

## 2018-08-27 ENCOUNTER — Ambulatory Visit: Payer: Managed Care, Other (non HMO) | Admitting: Sports Medicine

## 2018-08-27 ENCOUNTER — Ambulatory Visit (INDEPENDENT_AMBULATORY_CARE_PROVIDER_SITE_OTHER): Payer: Managed Care, Other (non HMO) | Admitting: Osteopathic Medicine

## 2018-08-27 ENCOUNTER — Other Ambulatory Visit: Payer: Self-pay

## 2018-08-27 ENCOUNTER — Encounter: Payer: Self-pay | Admitting: Osteopathic Medicine

## 2018-08-27 ENCOUNTER — Telehealth: Payer: Self-pay | Admitting: Osteopathic Medicine

## 2018-08-27 VITALS — BP 141/74

## 2018-08-27 DIAGNOSIS — R6889 Other general symptoms and signs: Secondary | ICD-10-CM | POA: Diagnosis not present

## 2018-08-27 DIAGNOSIS — Z20822 Contact with and (suspected) exposure to covid-19: Secondary | ICD-10-CM

## 2018-08-27 MED ORDER — IPRATROPIUM BROMIDE 0.06 % NA SOLN: 2.0000 | Freq: Four times a day (QID) | NASAL | 1 refills | Status: DC

## 2018-08-27 NOTE — Telephone Encounter (Signed)
Spoke with patient, scheduled her for testing tomorrow at [facility] at 1pm.  Testing protocol reviewed with patient.

## 2018-08-27 NOTE — Progress Notes (Signed)
Virtual Visit via Video (App used: Doximity) Note  I connected with      Dominique Russell on 08/27/18 at 11:30 AM by a telemedicine application and verified that I am speaking with the correct person using two identifiers.  Patient is at home I am in office     I discussed the limitations of evaluation and management by telemedicine and the availability of in person appointments. The patient expressed understanding and agreed to proceed.  History of Present Illness: Dominique Russell is a 63 y.o. female who would like to discuss COVID exposure    Went to visit family for a few weeks, they found out they were positive about 5 days ago, she was with them when they were sick. Patient has no symptoms at this point. Reports some ear fullness since [place].    Daughter is present and helps w/ history. She and her daughter are asthmatics and are concerned about their health as well. They are not patients of this practice.      servations/Objective: BP (!) 141/74 (Patient Position: Sitting, Cuff Size: Normal)  BP Readings from Last 3 Encounters:  08/27/18 (!) 141/74  08/01/18 125/73  07/27/18 116/75   Exam: Normal Speech.  NAD  Lab and Radiology Results No results found for this or any previous visit (from the past 72 hour(s)). No results found.     Assessment and Plan: 63 y.o. female with The encounter diagnosis was Suspected Covid-19 Virus Infection.  Pt and family advised to isolate as best as possible, wear masks. Recommend quarantine 2 weeks after return home from sick relatives house. Counseled on possible false negative tests, negative testing should NOT justify breaking quarantine.   Suspect eustachian tube dysfunction, sent atrovent and recommended OTC antihistamine/   PDMP not reviewed this encounter. No orders of the defined types were placed in this encounter.  Meds ordered this encounter  Medications  . ipratropium (ATROVENT) 0.06 %  nasal spray    Sig: Place 2 sprays into both nostrils 4 (four) times daily.    Dispense:  15 mL    Refill:  1     Follow Up Instructions: Return for RECHECK PENDING RESULTS / IF WORSE OR CHANGE.    I discussed the assessment and treatment plan with the patient. The patient was provided an opportunity to ask questions and all were answered. The patient agreed with the plan and demonstrated an understanding of the instructions.   The patient was advised to call back or seek an in-person evaluation if any new concerns, if symptoms worsen or if the condition fails to improve as anticipated.  25 minutes of non-face-to-face time was provided during this encounter.                      Historical information moved to improve visibility of documentation.  Past Medical History:  Diagnosis Date  . Diabetes ([place])   . Essential tremor   . Fibromyalgia   . Hypertension    Past Surgical History:  Procedure Laterality Date  . BREAST REDUCTION SURGERY     Social History   Tobacco Use  . Smoking status: Former Smoker    Quit date: 02/22/1995    Years since quitting: 23.5  . Smokeless tobacco: Never Used  Substance Use Topics  . Alcohol use: No    Comment: 1-2 times per month   family history includes Depression in her mother; Hypertension in her mother; Kidney  failure in her brother; Other in her sister.  Medications: Current Outpatient Medications  Medication Sig Dispense Refill  . acetaminophen (TYLENOL) 650 MG CR tablet Take 650 mg by mouth every 8 (eight) hours as needed for pain.    Marland Kitchen atorvastatin (LIPITOR) 20 MG tablet Take 1 tablet (20 mg total) by mouth every other day. 45 tablet 3  . B-D UF III MINI PEN NEEDLES 31G X 5 MM MISC USE AS DIRECTED WITH INSULIN AND VICTOZA.  11  . buPROPion (WELLBUTRIN XL) 300 MG 24 hr tablet Take 1 tablet (300 mg total) by mouth every morning. 90 tablet 3  . Cholecalciferol (VITAMIN D-3) 25 MCG (1000 UT) CAPS Take 2 capsules  (2,000 Units total) by mouth daily. 180 capsule 3  . clobetasol ointment (TEMOVATE) 9.44 % Apply 1 application topically 2 (two) times daily. To affected area(s) as needed, max 2-3 weeks to avoid whitening/thinning skin 45 g 1  . cyclobenzaprine (FLEXERIL) 10 MG tablet One half tab PO qHS, then increase gradually to one tab TID. 30 tablet 0  . dexamethasone (DECADRON) 4 MG tablet Take 1 tablet (4 mg total) by mouth 3 (three) times daily. 15 tablet 0  . diclofenac sodium (VOLTAREN) 1 % GEL Place onto the skin.    . DULoxetine (CYMBALTA) 60 MG capsule Take 1 capsule (60 mg total) by mouth 2 (two) times daily. 90 capsule 3  . Fluocinolone Acetonide 0.01 % OIL Place 5 drops in ear(s) 2 (two) times daily as needed. For itching ear up to 2 weeks 20 mL 1  . fluticasone (FLONASE) 50 MCG/ACT nasal spray Place 1-2 sprays into both nostrils daily. 48 g 3  . folic acid (FOLVITE) 967 MCG tablet Take 400 mcg by mouth daily.    . hydrochlorothiazide (HYDRODIURIL) 25 MG tablet Take 1 tablet (25 mg total) by mouth daily. 90 tablet 3  . insulin aspart protamine- aspart (NOVOLOG MIX 70/30) (70-30) 100 UNIT/ML injection Inject into the skin.    Marland Kitchen liraglutide (VICTOZA) 18 MG/3ML SOPN Inject 1.8 mg into the skin.    Marland Kitchen LYRICA 150 MG capsule Take 1 tablet by mouth 2 (two) times daily.     . Omega-3 Fatty Acids (FISH OIL) 1000 MG CAPS Take by mouth.    Marland Kitchen omeprazole (PRILOSEC) 40 MG capsule Take 1 capsule (40 mg total) by mouth daily. 90 capsule 1  . ONE TOUCH ULTRA TEST test strip     . pioglitazone-metformin (ACTOPLUS MET) 15-850 MG per tablet Take 1 tablet by mouth 2 (two) times daily with a meal.    . primidone (MYSOLINE) 50 MG tablet 2 in the AM, 1 at night 270 tablet 1  . simethicone (MYLICON) 80 MG chewable tablet Chew 80 mg every 6 (six) hours as needed by mouth for flatulence.    . traZODone (DESYREL) 150 MG tablet Take 1 tablet (150 mg total) by mouth at bedtime. 90 tablet 3  . valsartan (DIOVAN) 320 MG tablet  Take 1 tablet (320 mg total) by mouth daily. 90 tablet 1  . vitamin B-12 (CYANOCOBALAMIN) 1000 MCG tablet Take by mouth.    . vitamin E 400 UNIT capsule Take 400 Units by mouth daily.    Marland Kitchen ipratropium (ATROVENT) 0.06 % nasal spray Place 2 sprays into both nostrils 4 (four) times daily. 15 mL 1   No current facility-administered medications for this visit.    No Known Allergies  PDMP not reviewed this encounter. No orders of the defined types were placed in  this encounter.  Meds ordered this encounter  Medications  . ipratropium (ATROVENT) 0.06 % nasal spray    Sig: Place 2 sprays into both nostrils 4 (four) times daily.    Dispense:  15 mL    Refill:  1

## 2018-08-27 NOTE — Telephone Encounter (Signed)
Exposure to (+)COVID patients Can we arrange drive-through testing? Thanks!

## 2018-08-28 ENCOUNTER — Other Ambulatory Visit: Payer: Managed Care, Other (non HMO)

## 2018-08-28 DIAGNOSIS — Z20822 Contact with and (suspected) exposure to covid-19: Secondary | ICD-10-CM

## 2018-09-01 LAB — NOVEL CORONAVIRUS, NAA: SARS-CoV-2, NAA: NOT DETECTED

## 2018-09-04 ENCOUNTER — Telehealth: Payer: Self-pay

## 2018-09-04 NOTE — Telephone Encounter (Signed)
Contacted NP Mateo Flow - she is ok with provider giving her a call back at 4 pm today to discuss plan of care for pt.

## 2018-09-04 NOTE — Telephone Encounter (Signed)
[facility]o [facility] is requesting a call back from provider. Wants to discuss pt's recent visit. Pls contact her at [phone].

## 2018-09-04 NOTE — Telephone Encounter (Signed)
Can we call her back and see if there's a time we can schedule to chat? I can talk around 4:00 today or I'm ok to chat tomorrow over lunch or before/after clinic hours?

## 2018-09-04 NOTE — Telephone Encounter (Signed)
Dominique Russell had some concerns about borderline TSH, she ran a Hashimoto panel as well. 0.78 TSH

## 2018-09-05 ENCOUNTER — Telehealth: Payer: Self-pay

## 2018-09-05 NOTE — Telephone Encounter (Signed)
I spoke to [facility] yesterday, she was going to fax over the results.  Patient does not need to come in for a visit, this can be done virtually.  Can we schedule for sometime next week, hopefully I will have the results by then.

## 2018-09-05 NOTE — Telephone Encounter (Signed)
Pt left a vm msg stating she wants to come in for a visit. She would like to discuss the results from [facility]. As per pt, NP Mateo Flow was going to contact provider to discuss visit notes. Pls advise, thanks.

## 2018-09-05 NOTE — Telephone Encounter (Signed)
Attempted to contact pt, no answer. Phone just keep ringing. Unable to leave a vm msg. Will attempt to contact pt tomorrow.

## 2018-09-06 ENCOUNTER — Telehealth: Payer: Self-pay

## 2018-09-06 DIAGNOSIS — M899 Disorder of bone, unspecified: Secondary | ICD-10-CM

## 2018-09-06 DIAGNOSIS — E559 Vitamin D deficiency, unspecified: Secondary | ICD-10-CM

## 2018-09-06 NOTE — Telephone Encounter (Signed)
Mark from [employer] called on behalf of pt. He stated that Vitamin D testing from 01/31/2018 will not be covered by insurance. Test was billed under a wellness exam. Requesting for diagnosis code to be changed. We can faxed the updated diagnosis to [facility] at [phone], ref # [id number]. Pls advise, thanks.

## 2018-09-06 NOTE — Telephone Encounter (Signed)
Can use the following diagnosis codes:  E55.9 M89.9

## 2018-09-07 NOTE — Telephone Encounter (Signed)
I have re-faxed this with the new codes for this patient. No further inquires at this time.

## 2018-09-20 ENCOUNTER — Other Ambulatory Visit: Payer: Self-pay | Admitting: Osteopathic Medicine

## 2018-09-28 ENCOUNTER — Encounter: Payer: Self-pay | Admitting: Osteopathic Medicine

## 2018-10-11 ENCOUNTER — Encounter: Payer: Self-pay | Admitting: Osteopathic Medicine

## 2018-10-11 ENCOUNTER — Ambulatory Visit (INDEPENDENT_AMBULATORY_CARE_PROVIDER_SITE_OTHER): Payer: Medicare Other | Admitting: Osteopathic Medicine

## 2018-10-11 VITALS — Wt 235.0 lb

## 2018-10-11 DIAGNOSIS — H9202 Otalgia, left ear: Secondary | ICD-10-CM | POA: Diagnosis not present

## 2018-10-11 DIAGNOSIS — F339 Major depressive disorder, recurrent, unspecified: Secondary | ICD-10-CM

## 2018-10-11 MED ORDER — TRAZODONE HCL 150 MG PO TABS: 150.0000 mg | ORAL_TABLET | Freq: Every day | ORAL | 3 refills | Status: DC

## 2018-10-11 MED ORDER — AMOXICILLIN-POT CLAVULANATE 875-125 MG PO TABS: 1.0000 | ORAL_TABLET | Freq: Two times a day (BID) | ORAL | 0 refills | Status: DC

## 2018-10-11 NOTE — Progress Notes (Signed)
Virtual Visit via Phone   I connected with      Dominique Russell on 10/11/18 at 1:04 by a telemedicine application and verified that I am speaking with the correct person using two identifiers.  Patient is at home I am in office    I discussed the limitations of evaluation and management by telemedicine and the availability of in person appointments. The patient expressed understanding and agreed to proceed.  History of Present Illness: Dominique Russell is a 63 y.o. female who would like to discuss ear pain  L ear pain - we addressed this a bit at last visit 08/27/2018. About the same now.  We treated for eustachian tube dysfunction with ipratropium, patient states that it did not really make much difference.    Observations/Objective: There were no vitals taken for this visit. BP Readings from Last 3 Encounters:  08/27/18 (!) 141/74  08/01/18 125/73  07/27/18 116/75   Exam: Normal Speech.    Lab and Radiology Results No results found for this or any previous visit (from the past 72 hour(s)). No results found.     Assessment and Plan: 63 y.o. female with The primary encounter diagnosis was Left ear pain. A diagnosis of Depression, recurrent ([place]) was also pertinent to this visit.  Ideally would like to visualize ear but in absence of our ability to do this, I am okay to trial antibiotics and patient will need to follow-up in office if these are not helping after 3 to 5 days  Discussed lab results in case patient did not see MyChart message.  Does not meet diagnostic criteria for thyroid disorder, okay to continue to monitor.  Patient reports significant depression/anxiety but states she is happy following up with her psychiatrist, does not feel that she needs any medications adjusted right now or does not feel she is in crisis.  PDMP not reviewed this encounter. No orders of the defined types were placed in this encounter.  Meds ordered this encounter  Medications   . traZODone (DESYREL) 150 MG tablet    Sig: Take 1 tablet (150 mg total) by mouth at bedtime.    Dispense:  90 tablet    Refill:  3    Cancel 100 mg dose please!  Marland Kitchen amoxicillin-clavulanate (AUGMENTIN) 875-125 MG tablet    Sig: Take 1 tablet by mouth 2 (two) times daily.    Dispense:  14 tablet    Refill:  0   There are no Patient Instructions on file for this visit.  Instructions sent via MyChart. If MyChart not available, pt was given option for info via personal e-mail w/ no guarantee of protected health info over unsecured e-mail communication, and MyChart sign-up instructions were included.   Follow Up Instructions: Return if symptoms worsen or fail to improve.    I discussed the assessment and treatment plan with the patient. The patient was provided an opportunity to ask questions and all were answered. The patient agreed with the plan and demonstrated an understanding of the instructions.   The patient was advised to call back or seek an in-person evaluation if any new concerns, if symptoms worsen or if the condition fails to improve as anticipated.  15 minutes of non-face-to-face time was provided during this encounter.                      Historical information moved to improve visibility of documentation.  Past Medical History:  Diagnosis Date  . Diabetes ([place])   .  Essential tremor   . Fibromyalgia   . Hypertension    Past Surgical History:  Procedure Laterality Date  . BREAST REDUCTION SURGERY     Social History   Tobacco Use  . Smoking status: Former Smoker    Quit date: 02/22/1995    Years since quitting: 23.6  . Smokeless tobacco: Never Used  Substance Use Topics  . Alcohol use: No    Comment: 1-2 times per month   family history includes Depression in her mother; Hypertension in her mother; Kidney failure in her brother; Other in her sister.  Medications: Current Outpatient Medications  Medication Sig Dispense Refill  .  acetaminophen (TYLENOL) 650 MG CR tablet Take 650 mg by mouth every 8 (eight) hours as needed for pain.    Marland Kitchen atorvastatin (LIPITOR) 20 MG tablet Take 1 tablet (20 mg total) by mouth every other day. 45 tablet 3  . B-D UF III MINI PEN NEEDLES 31G X 5 MM MISC USE AS DIRECTED WITH INSULIN AND VICTOZA.  11  . buPROPion (WELLBUTRIN XL) 300 MG 24 hr tablet Take 1 tablet (300 mg total) by mouth every morning. 90 tablet 3  . Cholecalciferol (VITAMIN D-3) 25 MCG (1000 UT) CAPS Take 2 capsules (2,000 Units total) by mouth daily. 180 capsule 3  . clobetasol ointment (TEMOVATE) 8.10 % Apply 1 application topically 2 (two) times daily. To affected area(s) as needed, max 2-3 weeks to avoid whitening/thinning skin 45 g 1  . cyclobenzaprine (FLEXERIL) 10 MG tablet One half tab PO qHS, then increase gradually to one tab TID. 30 tablet 0  . dexamethasone (DECADRON) 4 MG tablet Take 1 tablet (4 mg total) by mouth 3 (three) times daily. 15 tablet 0  . diclofenac sodium (VOLTAREN) 1 % GEL Place onto the skin.    . DULoxetine (CYMBALTA) 60 MG capsule Take 1 capsule (60 mg total) by mouth 2 (two) times daily. 90 capsule 3  . Fluocinolone Acetonide 0.01 % OIL Place 5 drops in ear(s) 2 (two) times daily as needed. For itching ear up to 2 weeks 20 mL 1  . fluticasone (FLONASE) 50 MCG/ACT nasal spray Place 1-2 sprays into both nostrils daily. 48 g 3  . folic acid (FOLVITE) 175 MCG tablet Take 400 mcg by mouth daily.    . hydrochlorothiazide (HYDRODIURIL) 25 MG tablet Take 1 tablet (25 mg total) by mouth daily. 90 tablet 3  . insulin aspart protamine- aspart (NOVOLOG MIX 70/30) (70-30) 100 UNIT/ML injection Inject into the skin.    Marland Kitchen ipratropium (ATROVENT) 0.06 % nasal spray PLACE 2 SPRAYS INTO BOTH NOSTRILS 4 (FOUR) TIMES DAILY. 15 mL 1  . liraglutide (VICTOZA) 18 MG/3ML SOPN Inject 1.8 mg into the skin.    Marland Kitchen LYRICA 150 MG capsule Take 1 tablet by mouth 2 (two) times daily.     . Omega-3 Fatty Acids (FISH OIL) 1000 MG CAPS  Take by mouth.    Marland Kitchen omeprazole (PRILOSEC) 40 MG capsule Take 1 capsule (40 mg total) by mouth daily. 90 capsule 1  . ONE TOUCH ULTRA TEST test strip     . pioglitazone-metformin (ACTOPLUS MET) 15-850 MG per tablet Take 1 tablet by mouth 2 (two) times daily with a meal.    . primidone (MYSOLINE) 50 MG tablet 2 in the AM, 1 at night 270 tablet 1  . simethicone (MYLICON) 80 MG chewable tablet Chew 80 mg every 6 (six) hours as needed by mouth for flatulence.    . traZODone (DESYREL) 150 MG  tablet Take 1 tablet (150 mg total) by mouth at bedtime. 90 tablet 3  . valsartan (DIOVAN) 320 MG tablet Take 1 tablet (320 mg total) by mouth daily. 90 tablet 1  . vitamin B-12 (CYANOCOBALAMIN) 1000 MCG tablet Take by mouth.    . vitamin E 400 UNIT capsule Take 400 Units by mouth daily.     No current facility-administered medications for this visit.    No Known Allergies  PDMP not reviewed this encounter. No orders of the defined types were placed in this encounter.  No orders of the defined types were placed in this encounter.

## 2018-10-11 NOTE — Progress Notes (Signed)
Called pt at 1213 pm, no answer. Left a vm msg.

## 2018-10-16 ENCOUNTER — Other Ambulatory Visit: Payer: Self-pay | Admitting: Osteopathic Medicine

## 2018-10-25 ENCOUNTER — Other Ambulatory Visit: Payer: Self-pay | Admitting: Osteopathic Medicine

## 2018-10-31 DIAGNOSIS — M25561 Pain in right knee: Secondary | ICD-10-CM | POA: Diagnosis not present

## 2018-10-31 DIAGNOSIS — M549 Dorsalgia, unspecified: Secondary | ICD-10-CM | POA: Diagnosis not present

## 2018-10-31 DIAGNOSIS — G894 Chronic pain syndrome: Secondary | ICD-10-CM | POA: Diagnosis not present

## 2018-10-31 DIAGNOSIS — M170 Bilateral primary osteoarthritis of knee: Secondary | ICD-10-CM | POA: Diagnosis not present

## 2018-11-16 ENCOUNTER — Other Ambulatory Visit: Payer: Self-pay | Admitting: Osteopathic Medicine

## 2018-11-20 DIAGNOSIS — Z87891 Personal history of nicotine dependence: Secondary | ICD-10-CM | POA: Diagnosis not present

## 2018-11-20 DIAGNOSIS — E1169 Type 2 diabetes mellitus with other specified complication: Secondary | ICD-10-CM | POA: Diagnosis not present

## 2018-11-20 DIAGNOSIS — J986 Disorders of diaphragm: Secondary | ICD-10-CM | POA: Diagnosis not present

## 2018-11-20 DIAGNOSIS — R079 Chest pain, unspecified: Secondary | ICD-10-CM | POA: Diagnosis not present

## 2018-11-20 DIAGNOSIS — R9431 Abnormal electrocardiogram [ECG] [EKG]: Secondary | ICD-10-CM | POA: Diagnosis not present

## 2018-11-20 DIAGNOSIS — Z794 Long term (current) use of insulin: Secondary | ICD-10-CM | POA: Diagnosis not present

## 2018-11-20 DIAGNOSIS — R000 Tachycardia, unspecified: Secondary | ICD-10-CM | POA: Diagnosis not present

## 2018-11-20 DIAGNOSIS — R002 Palpitations: Secondary | ICD-10-CM | POA: Diagnosis not present

## 2018-11-20 DIAGNOSIS — R0789 Other chest pain: Secondary | ICD-10-CM | POA: Diagnosis not present

## 2018-11-20 DIAGNOSIS — I10 Essential (primary) hypertension: Secondary | ICD-10-CM | POA: Diagnosis not present

## 2018-11-20 DIAGNOSIS — Z79899 Other long term (current) drug therapy: Secondary | ICD-10-CM | POA: Diagnosis not present

## 2018-11-20 DIAGNOSIS — E119 Type 2 diabetes mellitus without complications: Secondary | ICD-10-CM | POA: Diagnosis not present

## 2018-11-20 DIAGNOSIS — R1013 Epigastric pain: Secondary | ICD-10-CM | POA: Diagnosis not present

## 2018-11-20 DIAGNOSIS — Z7982 Long term (current) use of aspirin: Secondary | ICD-10-CM | POA: Diagnosis not present

## 2018-11-20 DIAGNOSIS — Z9119 Patient's noncompliance with other medical treatment and regimen: Secondary | ICD-10-CM | POA: Diagnosis not present

## 2018-11-20 DIAGNOSIS — K219 Gastro-esophageal reflux disease without esophagitis: Secondary | ICD-10-CM | POA: Diagnosis not present

## 2018-11-20 DIAGNOSIS — E785 Hyperlipidemia, unspecified: Secondary | ICD-10-CM | POA: Diagnosis not present

## 2018-11-20 DIAGNOSIS — F419 Anxiety disorder, unspecified: Secondary | ICD-10-CM | POA: Diagnosis not present

## 2018-11-20 DIAGNOSIS — I517 Cardiomegaly: Secondary | ICD-10-CM | POA: Diagnosis not present

## 2018-11-20 MED ORDER — SODIUM CHLORIDE 0.9 % IV SOLN
125.00 | INTRAVENOUS | Status: DC
Start: ? — End: 2018-11-20

## 2018-11-22 ENCOUNTER — Encounter: Payer: Self-pay | Admitting: Osteopathic Medicine

## 2018-11-22 LAB — THYROXINE (T4) FREE, DIRECT
Thyroglobulin Antibody: 2.4
Thyroid Peroxidase Ab: 9
Thyroxine (T4): 9

## 2018-12-04 ENCOUNTER — Telehealth: Payer: Self-pay | Admitting: Neurology

## 2018-12-04 NOTE — Telephone Encounter (Signed)
They will need to discuss with PCP, as there may be other reasons not sleeping, and is out of my area of expertise.

## 2018-12-04 NOTE — Telephone Encounter (Signed)
Patient left msg with after hours about wanting to get a sleep study done- she is having problems sleeping at night. Thanks!

## 2018-12-04 NOTE — Telephone Encounter (Signed)
Called patient no answer provider message on voice mail to contact PCP

## 2018-12-18 ENCOUNTER — Other Ambulatory Visit: Payer: Self-pay | Admitting: Osteopathic Medicine

## 2018-12-20 NOTE — Progress Notes (Signed)
Virtual Visit via Video Note The purpose of this virtual visit is to provide medical care while limiting exposure to the novel coronavirus.    Consent was obtained for video visit:  Yes.   Answered questions that patient had about telehealth interaction:  Yes.   I discussed the limitations, risks, security and privacy concerns of performing an evaluation and management service by telemedicine. I also discussed with the patient that there may be a patient responsible charge related to this service. The patient expressed understanding and agreed to proceed.  Pt location: Home Physician Location: office Name of referring provider:  [name], DO I connected with Dominique Russell at patients initiation/request on 12/21/2018 at  2:30 PM EDT by video enabled telemedicine application and verified that I am speaking with the correct person using two identifiers. Pt MRN:  [id number] Pt DOB:  [birth year] Video Participants:  Dominique Russell;  Patients daughter   History of Present Illness:  Patient seen today in follow-up for tremor.  Last visit, I increased her primidone so that she was taking 50 mg, 2 tablets in the morning and 1 at night.  She reports that she stopped that and is only taking 1 in the AM - "it was messing with my memory - its always that pill that messes with my memory " She has a history of memory change, that was complicated by mood change.  She was supposed to go to the mood treatment center not long after our last visit and today she states that she has been there for 3-4 visits.  She was placed on Lamictal but states it it made her shake more and it was discontinued.  She does state that they asked me to order a sleep study, as they thought it was interfering with her memory and mood.  She goes to bed at 11:30pm.  She is slow to fall asleep.  She awakens in the night to use the bathroom and then it takes some time to use the bathroom.  She snores.  No one sleeps in the same  bed with her.  Her family sleeps upstairs and she sleeps downstairs.  She will awaken herself up with snorting.  She awakens at 8-9 am.  She doesn't feel refreshed in the AM.  She dozes accidentally in the day.  Over the last 2 years, weight has been stable.    Current/Previously tried tremor medications: primidone (states higher dosages caused memory change), topamax (personality change), gabapentin (was on it for neuropathy); lyrica; artane (pt stated didn't help - no SE and I never saw pt on it as d/c before came back); propranolol (pt felt made her worse); lamictal (placed on by psych but states it caused tremor)  Current Outpatient Medications on File Prior to Visit  Medication Sig Dispense Refill  . acetaminophen (TYLENOL) 650 MG CR tablet Take 650 mg by mouth every 8 (eight) hours as needed for pain.    Marland Kitchen amLODipine (NORVASC) 5 MG tablet Take 5 mg by mouth daily.    Marland Kitchen atorvastatin (LIPITOR) 20 MG tablet Take 1 tablet (20 mg total) by mouth every other day. 45 tablet 3  . B-D UF III MINI PEN NEEDLES 31G X 5 MM MISC USE AS DIRECTED WITH INSULIN AND VICTOZA.  11  . buPROPion (WELLBUTRIN XL) 300 MG 24 hr tablet Take 1 tablet (300 mg total) by mouth every morning. 90 tablet 3  . Cholecalciferol (VITAMIN D-3) 25 MCG (1000 UT) CAPS Take 2 capsules (  2,000 Units total) by mouth daily. 180 capsule 3  . clobetasol ointment (TEMOVATE) 7.91 % Apply 1 application topically 2 (two) times daily. To affected area(s) as needed, max 2-3 weeks to avoid whitening/thinning skin 45 g 1  . cyclobenzaprine (FLEXERIL) 10 MG tablet One half tab PO qHS, then increase gradually to one tab TID. 30 tablet 0  . diclofenac sodium (VOLTAREN) 1 % GEL Place onto the skin.    . DULoxetine (CYMBALTA) 60 MG capsule Take 1 capsule (60 mg total) by mouth 2 (two) times daily. 90 capsule 3  . fluticasone (FLONASE) 50 MCG/ACT nasal spray Place 1-2 sprays into both nostrils daily. 48 g 3  . folic acid (FOLVITE) 505 MCG tablet Take 400  mcg by mouth daily.    . hydrochlorothiazide (HYDRODIURIL) 25 MG tablet Take 1 tablet (25 mg total) by mouth daily. 90 tablet 3  . insulin aspart protamine- aspart (NOVOLOG MIX 70/30) (70-30) 100 UNIT/ML injection Inject into the skin.    Marland Kitchen ipratropium (ATROVENT) 0.06 % nasal spray PLACE 2 SPRAYS INTO BOTH NOSTRILS 4 (FOUR) TIMES DAILY. 15 mL 1  . liraglutide (VICTOZA) 18 MG/3ML SOPN Inject 1.8 mg into the skin.    Marland Kitchen LYRICA 150 MG capsule Take 1 tablet by mouth 2 (two) times daily.     . Omega-3 Fatty Acids (FISH OIL) 1000 MG CAPS Take by mouth.    Marland Kitchen omeprazole (PRILOSEC) 40 MG capsule Take 1 capsule (40 mg total) by mouth daily. 90 capsule 1  . ONE TOUCH ULTRA TEST test strip     . pioglitazone-metformin (ACTOPLUS MET) 15-850 MG per tablet Take 1 tablet by mouth 2 (two) times daily with a meal.    . primidone (MYSOLINE) 50 MG tablet 2 in the AM, 1 at night (Patient taking differently: Take 50 mg by mouth at bedtime. ) 270 tablet 1  . simethicone (MYLICON) 80 MG chewable tablet Chew 80 mg every 6 (six) hours as needed by mouth for flatulence.    . traZODone (DESYREL) 150 MG tablet Take 1 tablet (150 mg total) by mouth at bedtime. 90 tablet 3  . valsartan (DIOVAN) 320 MG tablet TAKE 1 TABLET BY MOUTH EVERY DAY 90 tablet 0  . vitamin B-12 (CYANOCOBALAMIN) 1000 MCG tablet Take by mouth.    . vitamin E 400 UNIT capsule Take 400 Units by mouth daily.     No current facility-administered medications on file prior to visit.       Observations/Objective:   Vitals:   12/21/18 1432  BP: (!) 102/56  Pulse: (!) 103  Height: 5' 4"  (1.626 m)   GEN:  The patient appears stated age and is in NAD.  Neurological examination:  Orientation:  Montreal Cognitive Assessment  07/10/2018  Visuospatial/ Executive (0/5) 2  Naming (0/3) 3  Attention: Read list of digits (0/2) 2  Attention: Read list of letters (0/1) 0  Attention: Serial 7 subtraction starting at 100 (0/3) 1  Language: Repeat phrase (0/2) 1   Language : Fluency (0/1) 0  Abstraction (0/2) 1  Delayed Recall (0/5) 0  Orientation (0/6) 4  Total 14  Adjusted Score (based on education) 15   Cranial nerves: There is good facial symmetry. There is no facial hypomimia.  The speech is fluent and clear. Soft palate rises symmetrically and there is no tongue deviation. Hearing is intact to conversational tone. Motor: Strength is at least antigravity x 4.   Shoulder shrug is equal and symmetric.  There is no pronator  drift.  Movement examination: Tone: unable Abnormal movements: There is postural tremor L more than R.  R is very mild and L is at least mod.  This was the same as previous visits.   Coordination:  There is no decremation with RAM's Gait and Station: She ambulates well in her home.  Lab Results  Component Value Date   VITAMINB12 1,055 07/20/2018   Lab Results  Component Value Date   TSH 0.78 07/20/2018       Assessment and Plan:   1.  Essential Tremor.             -Long discussion with the patient and her daughter today.  Unfortunately, she just is not able to tolerate medications.  She thought that propranolol made her tremor worse, which does not make physiologic sense.  She has not been able to tolerate higher dosages of primidone and is only on 50 mg daily.  She has tried multiple other medications, without success or with side effects.  She has not a DBS candidate because of memory.   2.    dementia  -Patient had neurocognitive testing in March, 2019.  There was evidence of mild dementia, with superimposed evidence of severe depression and anxiety.  Dr. Si Raider referred her to the mood treatment center.  She has been going there for treatment. 3.  Daytime hypersomnolence  -We will order a nocturnal polysomnogram, at the request of the mood treatment center.  We will order this at [facility].  Patient is certainly at risk for sleep apnea.       Follow Up Instructions: 8 months, sooner should new neurologic  issues arise   -I discussed the assessment and treatment plan with the patient. The patient was provided an opportunity to ask questions and all were answered. The patient agreed with the plan and demonstrated an understanding of the instructions.   The patient was advised to call back or seek an in-person evaluation if the symptoms worsen or if the condition fails to improve as anticipated.    Total Time spent in visit with the patient was: 15 min, of which more than 50% of the time was spent in counseling and/or coordinating care .   Pt understands and agrees with the plan of care outlined.     Alonza Bogus, DO

## 2018-12-21 ENCOUNTER — Encounter: Payer: Self-pay | Admitting: Neurology

## 2018-12-21 ENCOUNTER — Other Ambulatory Visit: Payer: Self-pay

## 2018-12-21 ENCOUNTER — Telehealth (INDEPENDENT_AMBULATORY_CARE_PROVIDER_SITE_OTHER): Payer: Medicare Other | Admitting: Neurology

## 2018-12-21 VITALS — BP 102/56 | HR 103 | Ht 64.0 in

## 2018-12-21 DIAGNOSIS — G250 Essential tremor: Secondary | ICD-10-CM | POA: Diagnosis not present

## 2018-12-21 DIAGNOSIS — G4710 Hypersomnia, unspecified: Secondary | ICD-10-CM | POA: Diagnosis not present

## 2018-12-29 ENCOUNTER — Other Ambulatory Visit: Payer: Self-pay | Admitting: Osteopathic Medicine

## 2018-12-29 NOTE — Telephone Encounter (Signed)
Forwarding medication refill request to PCP for review. 

## 2019-01-04 DIAGNOSIS — F411 Generalized anxiety disorder: Secondary | ICD-10-CM | POA: Diagnosis not present

## 2019-01-04 DIAGNOSIS — F3340 Major depressive disorder, recurrent, in remission, unspecified: Secondary | ICD-10-CM | POA: Diagnosis not present

## 2019-01-09 ENCOUNTER — Other Ambulatory Visit: Payer: Self-pay | Admitting: Osteopathic Medicine

## 2019-01-19 ENCOUNTER — Other Ambulatory Visit (HOSPITAL_COMMUNITY)
Admission: RE | Admit: 2019-01-19 | Discharge: 2019-01-19 | Disposition: A | Discharge status: Home / Self Care | Payer: BC Managed Care – PPO | Source: Ambulatory Visit | Attending: Internal Medicine | Admitting: Internal Medicine

## 2019-01-19 DIAGNOSIS — Z01812 Encounter for preprocedural laboratory examination: Secondary | ICD-10-CM | POA: Diagnosis not present

## 2019-01-19 DIAGNOSIS — Z20828 Contact with and (suspected) exposure to other viral communicable diseases: Secondary | ICD-10-CM | POA: Diagnosis not present

## 2019-01-19 LAB — SARS CORONAVIRUS 2 (TAT 6-24 HRS): SARS Coronavirus 2: NEGATIVE

## 2019-01-21 ENCOUNTER — Other Ambulatory Visit: Payer: Self-pay

## 2019-01-21 NOTE — Patient Outreach (Signed)
[facility]) Care Management  01/21/2019  Dominique Russell January 14, 1956 [id number]   Medication Adherence call to Dominique Russell Telephone call to Patient regarding Medication Adherence unable to reach patient. Dominique Russell is showing past due on Victoza under [place].   [place] Management Direct Dial [phone]  Fax [phone] [email or web address]

## 2019-01-22 ENCOUNTER — Other Ambulatory Visit: Payer: Self-pay

## 2019-01-22 ENCOUNTER — Ambulatory Visit (HOSPITAL_BASED_OUTPATIENT_CLINIC_OR_DEPARTMENT_OTHER): Payer: BC Managed Care – PPO | Attending: Neurology | Admitting: Internal Medicine

## 2019-01-22 DIAGNOSIS — Z6841 Body Mass Index (BMI) 40.0 and over, adult: Secondary | ICD-10-CM | POA: Insufficient documentation

## 2019-01-22 DIAGNOSIS — E119 Type 2 diabetes mellitus without complications: Secondary | ICD-10-CM | POA: Diagnosis not present

## 2019-01-22 DIAGNOSIS — R0902 Hypoxemia: Secondary | ICD-10-CM | POA: Diagnosis not present

## 2019-01-22 DIAGNOSIS — G4733 Obstructive sleep apnea (adult) (pediatric): Secondary | ICD-10-CM | POA: Diagnosis not present

## 2019-01-22 DIAGNOSIS — I10 Essential (primary) hypertension: Secondary | ICD-10-CM | POA: Insufficient documentation

## 2019-01-22 DIAGNOSIS — E669 Obesity, unspecified: Secondary | ICD-10-CM | POA: Diagnosis not present

## 2019-01-22 DIAGNOSIS — G4710 Hypersomnia, unspecified: Secondary | ICD-10-CM

## 2019-01-22 DIAGNOSIS — F329 Major depressive disorder, single episode, unspecified: Secondary | ICD-10-CM | POA: Diagnosis not present

## 2019-01-22 DIAGNOSIS — R5383 Other fatigue: Secondary | ICD-10-CM | POA: Diagnosis not present

## 2019-01-22 DIAGNOSIS — G4730 Sleep apnea, unspecified: Secondary | ICD-10-CM

## 2019-01-23 DIAGNOSIS — G894 Chronic pain syndrome: Secondary | ICD-10-CM | POA: Diagnosis not present

## 2019-01-23 DIAGNOSIS — M25561 Pain in right knee: Secondary | ICD-10-CM | POA: Diagnosis not present

## 2019-01-23 DIAGNOSIS — M549 Dorsalgia, unspecified: Secondary | ICD-10-CM | POA: Diagnosis not present

## 2019-01-23 DIAGNOSIS — M170 Bilateral primary osteoarthritis of knee: Secondary | ICD-10-CM | POA: Diagnosis not present

## 2019-01-27 DIAGNOSIS — G4730 Sleep apnea, unspecified: Secondary | ICD-10-CM | POA: Diagnosis not present

## 2019-01-27 DIAGNOSIS — G4710 Hypersomnia, unspecified: Secondary | ICD-10-CM

## 2019-01-27 NOTE — Procedures (Signed)
Patient Name: Dominique Russell, Dominique Russell Date: 01/22/2019 Gender: Female D.O.B: 04-Sep-1955 Age (years): 66 Referring Provider: Wells Guiles Tat Height (inches): 50 Interpreting Physician: Baird Lyons MD, ABSM Weight (lbs): 245 RPSGT: Carolin Coy BMI: 42 MRN: [id number] Neck Size: 17.00  CLINICAL INFORMATION Sleep Study Type: NPSG Indication for sleep study: Daytime Fatigue, Depression, Diabetes, Fatigue, Hypertension, Obesity, Snoring Epworth Sleepiness Score: 5  SLEEP STUDY TECHNIQUE As per the AASM Manual for the Scoring of Sleep and Associated Events v2.3 (April 2016) with a hypopnea requiring 4% desaturations.  The channels recorded and monitored were frontal, central and occipital EEG, electrooculogram (EOG), submentalis EMG (chin), nasal and oral airflow, thoracic and abdominal wall motion, anterior tibialis EMG, snore microphone, electrocardiogram, and pulse oximetry.  MEDICATIONS Medications self-administered by patient taken the night of the study : none reported  SLEEP ARCHITECTURE The study was initiated at 10:41:33 PM and ended at 5:01:05 AM.  Sleep onset time was 26.3 minutes and the sleep efficiency was 85.9%%. The total sleep time was 326.2 minutes.  Stage REM latency was N/A minutes.  The patient spent 17.2%% of the night in stage N1 sleep, 82.8%% in stage N2 sleep, 0.0%% in stage N3 and 0% in REM.  Alpha intrusion was absent.  Supine sleep was 72.88%.  RESPIRATORY PARAMETERS The overall apnea/hypopnea index (AHI) was 11.4 per hour. There were 24 total apneas, including 24 obstructive, 0 central and 0 mixed apneas. There were 38 hypopneas and 11 RERAs.  The AHI during Stage REM sleep was N/A per hour.  AHI while supine was 14.6 per hour.  The mean oxygen saturation was 90.5%. The minimum SpO2 during sleep was 84.0%.  moderate snoring was noted during this study.  CARDIAC DATA The 2 lead EKG demonstrated sinus rhythm. The mean heart rate was 97.3 beats  per minute. Other EKG findings include: PVCs.  LEG MOVEMENT DATA The total PLMS were 0 with a resulting PLMS index of 0.0. Associated arousal with leg movement index was 0.0 .  IMPRESSIONS - Mild obstructive sleep apnea occurred during this study (AHI = 11.4/h). - No significant central sleep apnea occurred during this study (CAI = 0.0/h). - Oxygen desaturation was noted during this study (Min O2 = 84.0%). - Supplemental O2 at 1L/m was added per protocol at 2:03 AM for persistent O2 sat 88% or less. Subsequent  mean O2 sat 91%. - The patient snored with moderate snoring volume. - EKG findings include PVCs. - Clinically significant periodic limb movements did not occur during sleep. No significant associated arousals.  DIAGNOSIS - Obstructive Sleep Apnea (327.23 [G47.33 ICD-10]) - Nocturnal Hypoxemia (327.26 [G47.36 ICD-10])  RECOMMENDATIONS - Suggest CPAP titration sleep study to document if CPAP therapy will resolve Nocturnal Hypoxemia, or supplenmental O2 will also be needed.  - Positional therapy avoiding supine position during sleep. - Be careful with alcohol, sedatives and other CNS depressants that may worsen sleep apnea and disrupt normal sleep architecture. - Sleep hygiene should be reviewed to assess factors that may improve sleep quality. - Weight management and regular exercise should be initiated or continued if appropriate.  [Electronically signed] 01/27/2019 12:24 PM  Baird Lyons MD, [place], American Board of Sleep Medicine   NPI: [id number]                           [place], [place] of Sleep [facility]  ELECTRONICALLY SIGNED ON:  01/27/2019, 12:16 PM Lester Kensal: [phone]  FX: [phone] [place]

## 2019-01-28 ENCOUNTER — Telehealth: Payer: Self-pay | Admitting: Neurology

## 2019-01-28 DIAGNOSIS — G4733 Obstructive sleep apnea (adult) (pediatric): Secondary | ICD-10-CM

## 2019-01-28 NOTE — Telephone Encounter (Signed)
Let pt/family know that there was just mild sleep apnea.   Put in order for CPAP titiration study at [facility].  Dx:  Obstructive sleep apnea.

## 2019-01-28 NOTE — Telephone Encounter (Signed)
Left message informing family of results and recommendations.  Order placed.

## 2019-02-01 ENCOUNTER — Other Ambulatory Visit: Payer: Self-pay

## 2019-02-01 NOTE — Patient Outreach (Signed)
[facility]) Care Management  02/01/2019  Dominique Russell August 09, 1955 [id number]   Medication Adherence call to Dominique Russell [place] Identifiers Verify spoke with patient she is past due on Victoza ,patient explain she is using  Once daily, and has already pick up from the pharmacy in November, patient has plenty at this time. Dominique Russell is showing past due under [place].   [place] Management Direct Dial [phone]  Fax [phone] [email or web address]

## 2019-02-05 ENCOUNTER — Encounter: Payer: Managed Care, Other (non HMO) | Admitting: Osteopathic Medicine

## 2019-02-11 DIAGNOSIS — F411 Generalized anxiety disorder: Secondary | ICD-10-CM | POA: Diagnosis not present

## 2019-02-11 DIAGNOSIS — F3340 Major depressive disorder, recurrent, in remission, unspecified: Secondary | ICD-10-CM | POA: Diagnosis not present

## 2019-02-21 ENCOUNTER — Other Ambulatory Visit: Payer: Self-pay | Admitting: Osteopathic Medicine

## 2019-02-21 DIAGNOSIS — E119 Type 2 diabetes mellitus without complications: Secondary | ICD-10-CM | POA: Diagnosis not present

## 2019-02-21 DIAGNOSIS — E118 Type 2 diabetes mellitus with unspecified complications: Secondary | ICD-10-CM | POA: Diagnosis not present

## 2019-02-21 DIAGNOSIS — Z6841 Body Mass Index (BMI) 40.0 and over, adult: Secondary | ICD-10-CM | POA: Diagnosis not present

## 2019-02-21 DIAGNOSIS — Z794 Long term (current) use of insulin: Secondary | ICD-10-CM | POA: Diagnosis not present

## 2019-02-21 DIAGNOSIS — E669 Obesity, unspecified: Secondary | ICD-10-CM | POA: Diagnosis not present

## 2019-02-21 NOTE — Telephone Encounter (Signed)
Forwarding medication refill requests to the clinical pool for review. 

## 2019-02-21 NOTE — Telephone Encounter (Signed)
Must make appointment 

## 2019-03-02 ENCOUNTER — Other Ambulatory Visit (HOSPITAL_COMMUNITY)
Admission: RE | Admit: 2019-03-02 | Discharge: 2019-03-02 | Disposition: A | Discharge status: Home / Self Care | Payer: BC Managed Care – PPO | Source: Ambulatory Visit | Attending: Internal Medicine | Admitting: Internal Medicine

## 2019-03-02 DIAGNOSIS — Z01812 Encounter for preprocedural laboratory examination: Secondary | ICD-10-CM | POA: Diagnosis not present

## 2019-03-02 DIAGNOSIS — Z20822 Contact with and (suspected) exposure to covid-19: Secondary | ICD-10-CM | POA: Diagnosis not present

## 2019-03-02 LAB — SARS CORONAVIRUS 2 (TAT 6-24 HRS): SARS Coronavirus 2: NEGATIVE

## 2019-03-05 ENCOUNTER — Other Ambulatory Visit: Payer: Self-pay

## 2019-03-05 ENCOUNTER — Ambulatory Visit (HOSPITAL_BASED_OUTPATIENT_CLINIC_OR_DEPARTMENT_OTHER): Payer: BC Managed Care – PPO | Attending: Neurology | Admitting: Internal Medicine

## 2019-03-05 DIAGNOSIS — G4733 Obstructive sleep apnea (adult) (pediatric): Secondary | ICD-10-CM | POA: Diagnosis not present

## 2019-03-06 ENCOUNTER — Other Ambulatory Visit (HOSPITAL_BASED_OUTPATIENT_CLINIC_OR_DEPARTMENT_OTHER): Payer: Self-pay

## 2019-03-06 DIAGNOSIS — G4733 Obstructive sleep apnea (adult) (pediatric): Secondary | ICD-10-CM

## 2019-03-09 DIAGNOSIS — G4733 Obstructive sleep apnea (adult) (pediatric): Secondary | ICD-10-CM | POA: Diagnosis not present

## 2019-03-09 NOTE — Procedures (Signed)
   Patient Name: Dominique Russell, Dominique Russell Date: 03/05/2019 Gender: Female D.O.B: Sep 03, 1955 Age (years): 64 Referring Provider: Lurena Joiner Tat Height (inches): 64 Interpreting Physician: Jetty Duhamel MD, ABSM Weight (lbs): 245 RPSGT: Rosette Reveal BMI: 42 MRN: [id number] Neck Size: 17.00  CLINICAL INFORMATION The patient is referred for a BiPAP titration to treat sleep apnea.  Date of NPSG, Split Night or HST:   NPSG 01/22/2019  AHI 11.4/ hr, desaturation to 84%, body weight 245 lbs  SLEEP STUDY TECHNIQUE As per the AASM Manual for the Scoring of Sleep and Associated Events v2.3 (April 2016) with a hypopnea requiring 4% desaturations.  The channels recorded and monitored were frontal, central and occipital EEG, electrooculogram (EOG), submentalis EMG (chin), nasal and oral airflow, thoracic and abdominal wall motion, anterior tibialis EMG, snore microphone, electrocardiogram, and pulse oximetry. Bilevel positive airway pressure (BPAP) was initiated at the beginning of the study and titrated to treat sleep-disordered breathing.  MEDICATIONS Medications self-administered by patient taken the night of the study : none reported  RESPIRATORY PARAMETERS Optimal IPAP Pressure (cm): 26 AHI at Optimal Pressure (/hr) 0.6 Optimal EPAP Pressure (cm): 22   Overall Minimal O2 (%): 85.0 Minimal O2 at Optimal Pressure (%): 90.0 SLEEP ARCHITECTURE Start Time: 10:55:59 PM Stop Time: 5:07:37 AM Total Time (min): 371.6 Total Sleep Time (min): 316.3 Sleep Latency (min): 13.8 Sleep Efficiency (%): 85.1% REM Latency (min): N/A WASO (min): 41.5 Stage N1 (%): 3.6% Stage N2 (%): 96.4% Stage N3 (%): 0.0% Stage R (%): 0 Supine (%): 81.73 Arousal Index (/hr): 4.7   CARDIAC DATA The 2 lead EKG demonstrated sinus rhythm. The mean heart rate was 83.6 beats per minute. Other EKG findings include: None.  LEG MOVEMENT DATA The total Periodic Limb Movements of Sleep (PLMS) were 0. The PLMS index was 0.0. A PLMS  index of <15 is considered normal in adults.  IMPRESSIONS - CPAP titration provided inadequate control at tolerated pressures and was converted to BIPAP titration. - An optimal BiPAP pressure was selected for this patient ( 26 /22 cm of water) - Central sleep apnea was not noted during this titration (CAI = 0.0/h). - Moderate oxygen desaturations were observed during this titration (min O2 = 85.0%). Minimum O2 sat at BIPAP 26/22 was 90%. - The patient snored with loud snoring volume. - No cardiac abnormalities were observed during this study. - Clinically significant periodic limb movements were not noted during this study. Arousals associated with PLMs were rare.  DIAGNOSIS - Obstructive Sleep Apnea (327.23 [G47.33 ICD-10])  RECOMMENDATIONS - Trial of BiPAP therapy on 25/22 cm H2O ( standard BIPAP machines usually do not go over 25 cwp). - Patient used a Small size [employer] Full Face Mask Simplus mask and heated humidification. - Be careful with alcohol, sedatives and other CNS depressants that may worsen sleep apnea and disrupt normal sleep architecture. - Sleep hygiene should be reviewed to assess factors that may improve sleep quality. - Weight management and regular exercise should be initiated or continued.  [Electronically signed] 03/09/2019 12:40 PM  Jetty Duhamel MD, ABSM Diplomate, American Board of Sleep Medicine   NPI: [id number]                          Jetty Duhamel Diplomate, American Board of Sleep Medicine  ELECTRONICALLY SIGNED ON:  03/09/2019, 12:35 PM [facility] PH: [phone]   FX: [phone] ACCREDITED BY THE AMERICAN ACADEMY OF SLEEP MEDICINE

## 2019-03-11 ENCOUNTER — Telehealth: Payer: Self-pay | Admitting: Neurology

## 2019-03-11 DIAGNOSIS — G4733 Obstructive sleep apnea (adult) (pediatric): Secondary | ICD-10-CM

## 2019-03-11 NOTE — Telephone Encounter (Signed)
Let pt know that sleep study has been reviewed and recommendations are as follows:  Trial of BiPAP therapy on 25/22 cm H2O ( standard BIPAP machines usually do not go over 25 cwp). - Patient used a Small size Fisher[employer]Paykel Full Face Mask Simplus mask and heated humidification.  Please order the above via [facility].  Diagnosis is obstructive sleep apnea.

## 2019-03-11 NOTE — Telephone Encounter (Signed)
Pt is aware and has no questions or concerns at this time   Placed order and confirmed with [facility]

## 2019-03-12 DIAGNOSIS — Z794 Long term (current) use of insulin: Secondary | ICD-10-CM | POA: Diagnosis not present

## 2019-03-12 DIAGNOSIS — E1142 Type 2 diabetes mellitus with diabetic polyneuropathy: Secondary | ICD-10-CM | POA: Diagnosis not present

## 2019-03-12 DIAGNOSIS — E119 Type 2 diabetes mellitus without complications: Secondary | ICD-10-CM | POA: Diagnosis not present

## 2019-03-15 DIAGNOSIS — F3340 Major depressive disorder, recurrent, in remission, unspecified: Secondary | ICD-10-CM | POA: Diagnosis not present

## 2019-03-15 DIAGNOSIS — F411 Generalized anxiety disorder: Secondary | ICD-10-CM | POA: Diagnosis not present

## 2019-03-19 DIAGNOSIS — G4733 Obstructive sleep apnea (adult) (pediatric): Secondary | ICD-10-CM | POA: Diagnosis not present

## 2019-03-26 ENCOUNTER — Telehealth: Payer: Self-pay | Admitting: Pulmonary Disease

## 2019-03-26 ENCOUNTER — Telehealth: Payer: Self-pay | Admitting: Neurology

## 2019-03-26 DIAGNOSIS — G4730 Sleep apnea, unspecified: Secondary | ICD-10-CM

## 2019-03-26 NOTE — Telephone Encounter (Signed)
error 

## 2019-03-26 NOTE — Telephone Encounter (Signed)
Patient called and said her sleep machine is giving her too much oxygen. She said the company needs an order from Dr. Arbutus Leas to decrease the pressure.   Respiratory Dept. [phone], ext [phone] [facility] [phone]

## 2019-03-26 NOTE — Telephone Encounter (Signed)
She is currently on what was recommended by the sleep study.  Please refer to Dr. Maple Hudson (pulmonary) for management of bipap.  Dx: sleep apnea

## 2019-03-26 NOTE — Telephone Encounter (Signed)
Is this something that you manage?

## 2019-03-26 NOTE — Telephone Encounter (Signed)
Gave patient the phone number to the office and the referral is placed [phone].

## 2019-04-10 DIAGNOSIS — E1142 Type 2 diabetes mellitus with diabetic polyneuropathy: Secondary | ICD-10-CM | POA: Diagnosis not present

## 2019-04-10 DIAGNOSIS — Z794 Long term (current) use of insulin: Secondary | ICD-10-CM | POA: Diagnosis not present

## 2019-04-11 ENCOUNTER — Institutional Professional Consult (permissible substitution): Payer: Medicare Other | Admitting: Pulmonary Disease

## 2019-04-11 ENCOUNTER — Encounter: Payer: Medicare Other | Admitting: Osteopathic Medicine

## 2019-04-19 ENCOUNTER — Other Ambulatory Visit: Payer: Self-pay | Admitting: Osteopathic Medicine

## 2019-04-19 DIAGNOSIS — G4733 Obstructive sleep apnea (adult) (pediatric): Secondary | ICD-10-CM | POA: Diagnosis not present

## 2019-04-23 DIAGNOSIS — M170 Bilateral primary osteoarthritis of knee: Secondary | ICD-10-CM | POA: Diagnosis not present

## 2019-04-23 DIAGNOSIS — M549 Dorsalgia, unspecified: Secondary | ICD-10-CM | POA: Diagnosis not present

## 2019-04-23 DIAGNOSIS — M25561 Pain in right knee: Secondary | ICD-10-CM | POA: Diagnosis not present

## 2019-04-23 DIAGNOSIS — G894 Chronic pain syndrome: Secondary | ICD-10-CM | POA: Diagnosis not present

## 2019-04-25 LAB — ABI

## 2019-04-30 ENCOUNTER — Other Ambulatory Visit: Payer: Self-pay

## 2019-04-30 ENCOUNTER — Encounter: Payer: Medicare Other | Admitting: Nurse Practitioner

## 2019-04-30 MED ORDER — PRIMIDONE 50 MG PO TABS: 50.0000 mg | ORAL_TABLET | Freq: Every day | ORAL | 1 refills | Status: DC

## 2019-04-30 NOTE — Telephone Encounter (Signed)
Per Dr Tat ok to refill rx as patient takes the medication. Rx(s) sent to pharmacy electronically.

## 2019-05-18 ENCOUNTER — Other Ambulatory Visit: Payer: Self-pay | Admitting: Osteopathic Medicine

## 2019-05-18 NOTE — Telephone Encounter (Signed)
Requested  medications are  due for refill today yes  Requested medications are on the active medication list yes  Last refill 12/31  Last visit 09/2018  Upcoming appt No   Notes to clinic Already had a curtesy refill and no appt is scheduled.

## 2019-05-21 ENCOUNTER — Ambulatory Visit: Payer: BC Managed Care – PPO | Admitting: Nurse Practitioner

## 2019-05-21 ENCOUNTER — Encounter: Payer: Self-pay | Admitting: Nurse Practitioner

## 2019-05-21 ENCOUNTER — Other Ambulatory Visit: Payer: Self-pay

## 2019-05-21 VITALS — BP 140/88 | HR 86 | Temp 97.8°F | Ht 62.0 in | Wt 256.0 lb

## 2019-05-21 DIAGNOSIS — I10 Essential (primary) hypertension: Secondary | ICD-10-CM | POA: Diagnosis not present

## 2019-05-21 DIAGNOSIS — E1169 Type 2 diabetes mellitus with other specified complication: Secondary | ICD-10-CM

## 2019-05-21 DIAGNOSIS — K219 Gastro-esophageal reflux disease without esophagitis: Secondary | ICD-10-CM

## 2019-05-21 DIAGNOSIS — M899 Disorder of bone, unspecified: Secondary | ICD-10-CM

## 2019-05-21 DIAGNOSIS — E1121 Type 2 diabetes mellitus with diabetic nephropathy: Secondary | ICD-10-CM

## 2019-05-21 DIAGNOSIS — Z23 Encounter for immunization: Secondary | ICD-10-CM

## 2019-05-21 DIAGNOSIS — T7840XS Allergy, unspecified, sequela: Secondary | ICD-10-CM

## 2019-05-21 DIAGNOSIS — Z0000 Encounter for general adult medical examination without abnormal findings: Secondary | ICD-10-CM | POA: Diagnosis not present

## 2019-05-21 DIAGNOSIS — Z794 Long term (current) use of insulin: Secondary | ICD-10-CM | POA: Diagnosis not present

## 2019-05-21 MED ORDER — VALSARTAN 320 MG PO TABS: 320.0000 mg | ORAL_TABLET | Freq: Every day | ORAL | 1 refills | Status: DC

## 2019-05-21 MED ORDER — AMLODIPINE BESYLATE 5 MG PO TABS: 5.0000 mg | ORAL_TABLET | Freq: Every day | ORAL | 1 refills | Status: DC

## 2019-05-21 MED ORDER — FLUTICASONE PROPIONATE 50 MCG/ACT NA SUSP: 1.0000 | Freq: Every day | NASAL | 3 refills | Status: DC

## 2019-05-21 MED ORDER — OMEPRAZOLE 40 MG PO CPDR: 40.0000 mg | DELAYED_RELEASE_CAPSULE | Freq: Every day | ORAL | 1 refills | Status: DC

## 2019-05-21 MED ORDER — VITAMIN D-3 25 MCG (1000 UT) PO CAPS: 2000.0000 [IU] | ORAL_CAPSULE | Freq: Every day | ORAL | 3 refills | Status: AC

## 2019-05-21 MED ORDER — ATORVASTATIN CALCIUM 20 MG PO TABS: 20.0000 mg | ORAL_TABLET | ORAL | 1 refills | Status: DC

## 2019-05-21 MED ORDER — HYDROCHLOROTHIAZIDE 25 MG PO TABS: 25.0000 mg | ORAL_TABLET | Freq: Every day | ORAL | 3 refills | Status: DC

## 2019-05-21 MED ORDER — IPRATROPIUM BROMIDE 0.06 % NA SOLN: 2.0000 | Freq: Four times a day (QID) | NASAL | 5 refills | Status: DC

## 2019-05-21 NOTE — Progress Notes (Signed)
Established Patient Office Visit  Subjective:  Patient ID: Dominique Russell, female    DOB: [birth year]  Age: 64 y.o. MRN: [id number]  CC:  Chief Complaint  Patient presents with  . Annual Exam    HPI Dominique Russell presents for annual physical exam. Patient has chronic medical history pertinent for insulin dependent type 2 diabetes, hypertension, hyperlipidemia, osteoarthritis, rheumatoid arthritis, depression, fibromyalgia, allergies, and obesity.   She is followed at the pain clinic for chronic pain associated with fibromyalgia. She is followed by psychiatry for depression and anxiety. She is followed by endocrinology for diabetes. She is followed by neurology.  She is followed by gynecology.   She is due for her annual eye exam, DEXA scan, PCV13, and a foot exam today.   She has scheduled her eye exam in the next few months.  She is scheduled to receive the COVID-19 vaccine 06/10/2019. She is scheduled for a mammogram in the next few months.   She reports that she has been in overall fairly good health and feeling well with the exception of her mood lately. She is working with psychiatry and recently started back on Wellbutrin in addition to her Cymbalta.  She does still feel she has a ways to go with her mood but is hopeful she will see improvement with the addition of the new medication.  She reports her diabetes is well controlled and she is taking her medication as prescribed.  She denies changes to her vision, paresthesias, episodes of low blood sugar, increased urination, increased thirst.  She reports she checks her blood sugar daily and most results are less than 180.  She also checks her feet daily and wear supportive shoes and socks when she is out and at home.  She feels that her blood pressure is well controlled.  She does take her medications as prescribed.  She does not have any changes to her vision, palpitations, edema, chest pain, headaches.  She has not been checking  her blood pressure at home on a regular basis but reports she has not necessarily felt the need to recently.  She reports she follows a low carbohydrate low-fat diet.  She does not get much exercise as she has been home more than any other place in the past year due to Covid.  She does report she is hopeful to be able to get out more in the near future.  Past Medical History:  Diagnosis Date  . Diabetes ([place])   . Essential tremor   . Fibromyalgia   . Hypertension     Past Surgical History:  Procedure Laterality Date  . BREAST REDUCTION SURGERY      Family History  Problem Relation Age of Onset  . Depression Mother   . Hypertension Mother   . Kidney failure Brother   . Other Sister        MVA  . Tremor Neg Hx     Social History   Socioeconomic History  . Marital status: Legally Separated    Spouse name: Not on file  . Number of children: 2  . Years of education: HS  . Highest education level: Not on file  Occupational History  . Not on file  Tobacco Use  . Smoking status: Former Smoker    Quit date: 02/22/1995    Years since quitting: 24.2  . Smokeless tobacco: Never Used  Substance and Sexual Activity  . Alcohol use: No  . Drug use: No  . Sexual activity: Not Currently  Other Topics Concern  . Not on file  Social History Narrative   Patient lives at home with her daughter.   Caffeine Use: drinks regurlarly   Social Determinants of Health   Financial Resource Strain:   . Difficulty of Paying Living Expenses:   Food Insecurity:   . Worried About Charity fundraiser in the Last Year:   . Arboriculturist in the Last Year:   Transportation Needs:   . Film/video editor (Medical):   Marland Kitchen Lack of Transportation (Non-Medical):   Physical Activity:   . Days of Exercise per Week:   . Minutes of Exercise per Session:   Stress:   . Feeling of Stress :   Social Connections:   . Frequency of Communication with Friends and Family:   . Frequency of Social  Gatherings with Friends and Family:   . Attends Religious Services:   . Active Member of Clubs or Organizations:   . Attends Archivist Meetings:   Marland Kitchen Marital Status:   Intimate Partner Violence:   . Fear of Current or Ex-Partner:   . Emotionally Abused:   Marland Kitchen Physically Abused:   . Sexually Abused:     Outpatient Medications Prior to Visit  Medication Sig Dispense Refill  . acetaminophen (TYLENOL) 650 MG CR tablet Take 650 mg by mouth every 8 (eight) hours as needed for pain.    Marland Kitchen amLODipine (NORVASC) 5 MG tablet Take 5 mg by mouth daily.    Marland Kitchen atorvastatin (LIPITOR) 20 MG tablet TAKE 1 TABLET BY MOUTH EVERY OTHER DAY 45 tablet 9  . B-D UF III MINI PEN NEEDLES 31G X 5 MM MISC USE AS DIRECTED WITH INSULIN AND VICTOZA.  11  . Cholecalciferol (VITAMIN D-3) 25 MCG (1000 UT) CAPS Take 2 capsules (2,000 Units total) by mouth daily. 180 capsule 3  . clobetasol ointment (TEMOVATE) 2.72 % Apply 1 application topically 2 (two) times daily. To affected area(s) as needed, max 2-3 weeks to avoid whitening/thinning skin 45 g 1  . cyclobenzaprine (FLEXERIL) 10 MG tablet One half tab PO qHS, then increase gradually to one tab TID. 30 tablet 0  . diclofenac sodium (VOLTAREN) 1 % GEL Place onto the skin.    . DULoxetine (CYMBALTA) 60 MG capsule Take 1 capsule (60 mg total) by mouth daily. MUST MAKE APPOINTMENT 90 capsule 0  . fluticasone (FLONASE) 50 MCG/ACT nasal spray Place 1-2 sprays into both nostrils daily. 48 g 3  . folic acid (FOLVITE) 536 MCG tablet Take 400 mcg by mouth daily.    . hydrochlorothiazide (HYDRODIURIL) 25 MG tablet TAKE 1 TABLET BY MOUTH EVERY DAY 90 tablet 3  . insulin aspart protamine- aspart (NOVOLOG MIX 70/30) (70-30) 100 UNIT/ML injection Inject into the skin.    Marland Kitchen ipratropium (ATROVENT) 0.06 % nasal spray PLACE 2 SPRAYS INTO BOTH NOSTRILS 4 (FOUR) TIMES DAILY. 15 mL 1  . liraglutide (VICTOZA) 18 MG/3ML SOPN Inject 1.8 mg into the skin.    Marland Kitchen LYRICA 150 MG capsule Take 1  tablet by mouth 2 (two) times daily.     . Omega-3 Fatty Acids (FISH OIL) 1000 MG CAPS Take by mouth.    Marland Kitchen omeprazole (PRILOSEC) 40 MG capsule Take 1 capsule (40 mg total) by mouth daily. 90 capsule 1  . ONE TOUCH ULTRA TEST test strip     . pioglitazone-metformin (ACTOPLUS MET) 15-850 MG per tablet Take 1 tablet by mouth 2 (two) times daily with a meal.    .  primidone (MYSOLINE) 50 MG tablet Take 1 tablet (50 mg total) by mouth at bedtime. 90 tablet 1  . simethicone (MYLICON) 80 MG chewable tablet Chew 80 mg every 6 (six) hours as needed by mouth for flatulence.    . traZODone (DESYREL) 150 MG tablet Take 1 tablet (150 mg total) by mouth at bedtime. 90 tablet 3  . valsartan (DIOVAN) 320 MG tablet TAKE 1 TABLET (320 MG TOTAL) BY MOUTH DAILY. MUST MAKE APPOINTMENT 90 tablet 0  . vitamin B-12 (CYANOCOBALAMIN) 1000 MCG tablet Take by mouth.    . vitamin E 400 UNIT capsule Take 400 Units by mouth daily.    Marland Kitchen buPROPion (WELLBUTRIN XL) 300 MG 24 hr tablet Take 1 tablet (300 mg total) by mouth every morning. 90 tablet 3  . ARIPiprazole (ABILIFY) 10 MG tablet Take 10 mg by mouth daily.    Marland Kitchen buPROPion (WELLBUTRIN) 75 MG tablet Take 1 tablet by mouth in the morning and at bedtime.    . meloxicam (MOBIC) 15 MG tablet Take 15 mg by mouth daily.    . metFORMIN (GLUCOPHAGE-XR) 500 MG 24 hr tablet Take 500 mg by mouth every morning.    . pramipexole (MIRAPEX) 0.25 MG tablet Take 1 tablet by mouth daily.    . Vitamin D, Ergocalciferol, (DRISDOL) 1.25 MG (50000 UNIT) CAPS capsule Take 50,000 Units by mouth once a week.     No facility-administered medications prior to visit.    No Known Allergies  ROS Review of Systems  Constitutional: Negative for appetite change, chills, fatigue, fever and unexpected weight change.  HENT: Negative for congestion, dental problem, ear pain, postnasal drip, rhinorrhea, sinus pressure, sinus pain, sore throat, trouble swallowing and voice change.   Eyes: Positive for  redness.  Respiratory: Negative for cough, chest tightness, shortness of breath and wheezing.   Cardiovascular: Negative for chest pain, palpitations and leg swelling.  Gastrointestinal: Negative for abdominal distention, abdominal pain, blood in stool, constipation, diarrhea, nausea and vomiting.  Endocrine: Negative for polydipsia, polyphagia and polyuria.  Genitourinary: Negative for difficulty urinating, dysuria, frequency, menstrual problem, pelvic pain, urgency, vaginal bleeding and vaginal discharge.  Musculoskeletal: Positive for arthralgias, back pain, joint swelling and myalgias.  Skin: Negative for rash.  Allergic/Immunologic: Positive for environmental allergies.  Neurological: Negative for dizziness, tremors, syncope, speech difficulty, weakness, light-headedness and headaches.  Hematological: Does not bruise/bleed easily.  Psychiatric/Behavioral: Positive for decreased concentration, dysphoric mood and sleep disturbance. The patient is nervous/anxious.       Objective:    Physical Exam  Constitutional: She is oriented to person, place, and time. Vital signs are normal. She appears well-developed and well-nourished.  HENT:  Head: Normocephalic.  Right Ear: Hearing, tympanic membrane and external ear normal.  Left Ear: Hearing, tympanic membrane and external ear normal.  Nose: Mucosal edema present.  Mouth/Throat: Oropharynx is clear and moist.  Eyes: Pupils are equal, round, and reactive to light. Conjunctivae and EOM are normal.  Neck: No JVD present. No thyromegaly present.  Cardiovascular: Normal rate, regular rhythm, normal heart sounds and intact distal pulses.  No murmur heard. Pulmonary/Chest: Effort normal and breath sounds normal. No respiratory distress. She has no wheezes.  Abdominal: Soft. Bowel sounds are normal. She exhibits no distension. There is no hepatosplenomegaly. There is no abdominal tenderness. There is no rebound and no CVA tenderness.   Musculoskeletal:        General: No tenderness or edema. Normal range of motion.     Cervical back: Normal range of  motion and neck supple.  Lymphadenopathy:    She has no cervical adenopathy.  Neurological: She is alert and oriented to person, place, and time. She has normal reflexes. Coordination normal.  Skin: Skin is warm and dry.  Psychiatric: Her behavior is normal. Judgment and thought content normal. She exhibits a depressed mood.  Nursing note and vitals reviewed.   BP 140/88   Pulse 86   Temp 97.8 F (36.6 C) (Oral)   Ht 5' 2"  (1.575 m)   Wt 256 lb (116.1 kg)   LMP  (Exact Date)   SpO2 95%   BMI 46.82 kg/m  Wt Readings from Last 3 Encounters:  05/21/19 256 lb (116.1 kg)  03/05/19 245 lb (111.1 kg)  01/22/19 245 lb (111.1 kg)     Health Maintenance Due  Topic Date Due  . FOOT EXAM  Never done  . OPHTHALMOLOGY EXAM  Never done  . DEXA SCAN  Never done  . PNA vac Low Risk Adult (1 of 2 - PCV13) 09/18/2018    There are no preventive care reminders to display for this patient.  Lab Results  Component Value Date   TSH 0.78 07/20/2018   Lab Results  Component Value Date   WBC 5.3 07/19/2018   HGB 12.0 07/19/2018   HCT 36 07/19/2018   MCV 86.9 01/31/2018   PLT 245 07/19/2018   Lab Results  Component Value Date   NA 140 05/04/2018   K 4.6 05/04/2018   CO2 25 05/04/2018   GLUCOSE 133 (H) 05/04/2018   BUN 24 (A) 07/20/2018   CREATININE 1.2 (A) 07/20/2018   BILITOT 0.2 01/31/2018   ALKPHOS 52 07/08/2016   AST 19 01/31/2018   ALT 13 01/31/2018   PROT 7.1 01/31/2018   ALBUMIN 4.1 07/08/2016   CALCIUM 8.7 05/04/2018   Lab Results  Component Value Date   CHOL 149 01/31/2018   Lab Results  Component Value Date   HDL 48 (L) 01/31/2018   Lab Results  Component Value Date   LDLCALC 72 01/31/2018   Lab Results  Component Value Date   TRIG 197 (H) 01/31/2018   Lab Results  Component Value Date   CHOLHDL 3.1 01/31/2018   No results found for:  HGBA1C    Assessment & Plan:   1. Annual physical exam 64 year old female annual physical exam.  Will obtain hemoglobin A1c, CBC, CMP, lipid panel.  DEXA scan ordered for bone density.  PCV 13 provided at the visit today. Patient provided with information on health management for 64 year old female. All questions and concerns answered and addressed in the visit.  We will contact the patient if lab results reveal a need for change to medication or more frequent follow-up. Refills provided for medications provided by this office.  If further refills are required patient was instructed to contact the office or the pharmacy. Follow-up in approximately 1 year for annual physical exam. - Hemoglobin A1c - CBC with Differential - COMPLETE METABOLIC PANEL WITH GFR - Lipid panel - DG Bone Density  2. Gastroesophageal reflux disease History of gastroesophageal reflux disease well-controlled with daily Prilosec.  Patient denies any concerns or complaints at this time. Plan to continue medication as prescribed. - omeprazole (PRILOSEC) 40 MG capsule; Take 1 capsule (40 mg total) by mouth daily.  Dispense: 90 capsule; Refill: 1  3. Essential hypertension Chronic hypertension.  The patient blood pressure is within the expected range on the visit today.  Will obtain labs today to evaluate complete blood  levels, kidney function, and liver function.  We will also evaluate hemoglobin A1c for comorbidity concerns. Refills provided on amlodipine hydrochlorothiazide and valsartan today.  Patient instructed to contact the office for her pharmacy if she requires additional refills. Patient to follow-up in approximately 6 months for blood pressure. - Hemoglobin A1c - CBC with Differential - COMPLETE METABOLIC PANEL WITH GFR - Lipid panel - amLODipine (NORVASC) 5 MG tablet; Take 1 tablet (5 mg total) by mouth daily.  Dispense: 90 tablet; Refill: 1 - hydrochlorothiazide (HYDRODIURIL) 25 MG tablet; Take 1 tablet  (25 mg total) by mouth daily.  Dispense: 90 tablet; Refill: 3 - valsartan (DIOVAN) 320 MG tablet; Take 1 tablet (320 mg total) by mouth daily.  Dispense: 90 tablet; Refill: 1  5. Type 2 diabetes mellitus with diabetic nephropathy, with long-term current use of insulin (HCC) Type 2 diabetes with diabetic neuropathy and insulin use.  Patient reports well-controlled diabetes at this time.  Will obtain labs today to determine hemoglobin A1c, blood levels, kidney function, electrolytes, lipids. Discussed with patient the importance of frequent monitoring of her blood sugar and evaluation of her feet daily for sores and wounds.  Discussed the importance of utilizing proper footwear even when she is at home. Foot exam performed today. Is followed by endocrinology and receives her refills for medications through them.  I am happy to collaborate with endocrinology for the patient's needs. Patient to follow-up in approximately 6 months for diabetes. - Hemoglobin A1c - CBC with Differential - COMPLETE METABOLIC PANEL WITH GFR - Lipid panel  6. Hyperlipidemia associated with type 2 diabetes mellitus ([place]) History of hyperlipidemia associated with type 2 diabetes.  Patient reports that she has been taking Lipitor as prescribed and denies any side effects at this time. Will obtain labs today for hemoglobin A1c to monitor for blood sugar control, CBC for blood counts, CMP for kidney and liver function, and lipid panels to assess for comorbidities and effective medication on the body. Discussed with the patient the importance of maintaining a low-fat low carbohydrate diet and exercise when possible. Patient to follow-up in approximately 6 months for hyperlipidemia. - atorvastatin (LIPITOR) 20 MG tablet; Take 1 tablet (20 mg total) by mouth every other day.  Dispense: 45 tablet; Refill: 1  7. Allergy, sequela Patient does have a history of allergic rhinitis.  Refill for fluticasone provided today. Patient  instructed to contact the office if she begins to experiencing a worsening of her symptoms or if the medication does not help. - fluticasone (FLONASE) 50 MCG/ACT nasal spray; Place 1-2 sprays into both nostrils daily.  Dispense: 16 g; Refill: 3  8. Bone disorder Patient does have a history of bone disorder and is taking supplemental vitamin D and calcium.  At 64 years old it is recommended for patients to have a DEXA scan.  Discussed this with the patient and she is agreeable to have the scan performed. Orders for DEXA scan provided will notify the patient of results. - DG Bone Density  9. Need for vaccination with 13-polyvalent pneumococcal conjugate vaccine PSV 13 vaccine provided.  This does complete her pneumococcal vaccines required for her age group. - Pneumococcal conjugate vaccine 13-valent IM   Return in about 6 months (around 11/21/2019) for DM/HTN/HLD/Depression.    Orma Render, NP

## 2019-05-21 NOTE — Patient Instructions (Signed)
Health Maintenance After Age 65 After age 65, you are at a higher risk for certain long-term diseases and infections as well as injuries from falls. Falls are a major cause of broken bones and head injuries in people who are older than age 65. Getting regular preventive care can help to keep you healthy and well. Preventive care includes getting regular testing and making lifestyle changes as recommended by your health care provider. Talk with your health care provider about:  Which screenings and tests you should have. A screening is a test that checks for a disease when you have no symptoms.  A diet and exercise plan that is right for you. What should I know about screenings and tests to prevent falls? Screening and testing are the best ways to find a health problem Dominique Russell. [name] diagnosis and treatment give you the best chance of managing medical conditions that are common after age 65. Certain conditions and lifestyle choices may make you more likely to have a fall. Your health care provider may recommend:  Regular vision checks. Poor vision and conditions such as cataracts can make you more likely to have a fall. If you wear glasses, make sure to get your prescription updated if your vision changes.  Medicine review. Work with your health care provider to regularly review all of the medicines you are taking, including over-the-counter medicines. Ask your health care provider about any side effects that may make you more likely to have a fall. Tell your health care provider if any medicines that you take make you feel dizzy or sleepy.  Osteoporosis screening. Osteoporosis is a condition that causes the bones to get weaker. This can make the bones weak and cause them to break more easily.  Blood pressure screening. Blood pressure changes and medicines to control blood pressure can make you feel dizzy.  Strength and balance checks. Your health care provider may recommend certain tests to check your  strength and balance while standing, walking, or changing positions.  Foot health exam. Foot pain and numbness, as well as not wearing proper footwear, can make you more likely to have a fall.  Depression screening. You may be more likely to have a fall if you have a fear of falling, feel emotionally low, or feel unable to do activities that you used to do.  Alcohol use screening. Using too much alcohol can affect your balance and may make you more likely to have a fall. What actions can I take to lower my risk of falls? General instructions  Talk with your health care provider about your risks for falling. Tell your health care provider if: ? You fall. Be sure to tell your health care provider about all falls, even ones that seem minor. ? You feel dizzy, sleepy, or off-balance.  Take over-the-counter and prescription medicines only as told by your health care provider. These include any supplements.  Eat a healthy diet and maintain a healthy weight. A healthy diet includes low-fat dairy products, low-fat (lean) meats, and fiber from whole grains, beans, and lots of fruits and vegetables. Home safety  Remove any tripping hazards, such as rugs, cords, and clutter.  Install safety equipment such as grab bars in bathrooms and safety rails on stairs.  Keep rooms and walkways well-lit. Activity   Follow a regular exercise program to stay fit. This will help you maintain your balance. Ask your health care provider what types of exercise are appropriate for you.  If you need a cane or   walker, use it as recommended by your health care provider.  Wear supportive shoes that have nonskid soles. Lifestyle  Do not drink alcohol if your health care provider tells you not to drink.  If you drink alcohol, limit how much you have: ? 0-1 drink a day for women. ? 0-2 drinks a day for men.  Be aware of how much alcohol is in your drink. In the [place] one drink equals one typical bottle of beer (12  oz), one-half glass of wine (5 oz), or one shot of hard liquor (1 oz).  Do not use any products that contain nicotine or tobacco, such as cigarettes and e-cigarettes. If you need help quitting, ask your health care provider. Summary  Having a healthy lifestyle and getting preventive care can help to protect your health and wellness after age 65.  Screening and testing are the best way to find a health problem Dominique Russell and help you avoid having a fall. Dominique Russell diagnosis and treatment give you the best chance for managing medical conditions that are more common for people who are older than age 65.  Falls are a major cause of broken bones and head injuries in people who are older than age 65. Take precautions to prevent a fall at home.  Work with your health care provider to learn what changes you can make to improve your health and wellness and to prevent falls. This information is not intended to replace advice given to you by your health care provider. Make sure you discuss any questions you have with your health care provider. Document Revised: 05/31/2018 Document Reviewed: 12/21/2016 Elsevier Patient Education  2020 Elsevier Inc.  

## 2019-05-22 LAB — LIPID PANEL
Cholesterol: 126 mg/dL (ref <200)
HDL: 59 mg/dL (ref >=50)
LDL Cholesterol (Calc): 47 mg/dL (calc)
Non-HDL Cholesterol (Calc): 67 mg/dL (calc) (ref <130)
Total CHOL/HDL Ratio: 2.1 (calc) (ref <5.0)
Triglycerides: 116 mg/dL (ref <150)

## 2019-05-22 LAB — COMPLETE METABOLIC PANEL WITH GFR
AG Ratio: 1.5 (calc) (ref 1.0–2.5)
ALT: 16 U/L (ref 6–29)
AST: 18 U/L (ref 10–35)
Albumin: 4.1 g/dL (ref 3.6–5.1)
Alkaline phosphatase (APISO): 48 U/L (ref 37–153)
BUN/Creatinine Ratio: 18 (calc) (ref 6–22)
BUN: 22 mg/dL (ref 7–25)
CO2: 27 mmol/L (ref 20–32)
Calcium: 9.2 mg/dL (ref 8.6–10.4)
Chloride: 104 mmol/L (ref 98–110)
Creat: 1.24 mg/dL — ABNORMAL HIGH (ref 0.50–0.99)
GFR, Est African American: 53 mL/min/{1.73_m2} — ABNORMAL LOW (ref >=60)
GFR, Est Non African American: 46 mL/min/{1.73_m2} — ABNORMAL LOW (ref >=60)
Globulin: 2.7 g/dL (calc) (ref 1.9–3.7)
Glucose, Bld: 132 mg/dL — ABNORMAL HIGH (ref 65–99)
Potassium: 4.3 mmol/L (ref 3.5–5.3)
Sodium: 139 mmol/L (ref 135–146)
Total Bilirubin: 0.3 mg/dL (ref 0.2–1.2)
Total Protein: 6.8 g/dL (ref 6.1–8.1)

## 2019-05-22 LAB — CBC WITH DIFFERENTIAL/PLATELET
Absolute Monocytes: 552 cells/uL (ref 200–950)
Basophils Absolute: 32 cells/uL (ref 0–200)
Basophils Relative: 0.7 %
Eosinophils Absolute: 120 cells/uL (ref 15–500)
Eosinophils Relative: 2.6 %
HCT: 37.5 % (ref 35.0–45.0)
Hemoglobin: 11.9 g/dL (ref 11.7–15.5)
Lymphs Abs: 2282 cells/uL (ref 850–3900)
MCH: 28.2 pg (ref 27.0–33.0)
MCHC: 31.7 g/dL — ABNORMAL LOW (ref 32.0–36.0)
MCV: 88.9 fL (ref 80.0–100.0)
MPV: 11.4 fL (ref 7.5–12.5)
Monocytes Relative: 12 %
Neutro Abs: 1615 cells/uL (ref 1500–7800)
Neutrophils Relative %: 35.1 %
Platelets: 183 10*3/uL (ref 140–400)
RBC: 4.22 10*6/uL (ref 3.80–5.10)
RDW: 12.1 % (ref 11.0–15.0)
Total Lymphocyte: 49.6 %
WBC: 4.6 10*3/uL (ref 3.8–10.8)

## 2019-05-22 LAB — HEMOGLOBIN A1C
Hgb A1c MFr Bld: 7.3 % of total Hgb — ABNORMAL HIGH (ref <5.7)
Mean Plasma Glucose: 163 (calc)
eAG (mmol/L): 9 (calc)

## 2019-05-23 DIAGNOSIS — G4733 Obstructive sleep apnea (adult) (pediatric): Secondary | ICD-10-CM | POA: Diagnosis not present

## 2019-06-26 ENCOUNTER — Ambulatory Visit (INDEPENDENT_AMBULATORY_CARE_PROVIDER_SITE_OTHER): Payer: Medicare Other

## 2019-06-26 ENCOUNTER — Other Ambulatory Visit: Payer: Self-pay

## 2019-06-26 DIAGNOSIS — M899 Disorder of bone, unspecified: Secondary | ICD-10-CM

## 2019-06-26 DIAGNOSIS — Z0000 Encounter for general adult medical examination without abnormal findings: Secondary | ICD-10-CM

## 2019-06-26 DIAGNOSIS — Z780 Asymptomatic menopausal state: Secondary | ICD-10-CM | POA: Diagnosis not present

## 2019-07-02 ENCOUNTER — Telehealth: Payer: Self-pay

## 2019-07-02 MED ORDER — ROSUVASTATIN CALCIUM 20 MG PO TABS: 20.0000 mg | ORAL_TABLET | Freq: Every day | ORAL | 3 refills | Status: DC

## 2019-07-02 NOTE — Telephone Encounter (Signed)
Pt called stating that her insurance will no longer cover atorvastatin 20 mg. She was informed that preferred formulary is required. Covered rxs are zocor, crestor and pravachol. Pls send rx to [facility].

## 2019-07-02 NOTE — Telephone Encounter (Signed)
Alternative sent to pharmacy on file

## 2019-07-03 NOTE — Telephone Encounter (Signed)
Unable to reach patient, voice mail full.

## 2019-07-04 DIAGNOSIS — Z79899 Other long term (current) drug therapy: Secondary | ICD-10-CM | POA: Diagnosis not present

## 2019-07-04 DIAGNOSIS — F3181 Bipolar II disorder: Secondary | ICD-10-CM | POA: Diagnosis not present

## 2019-07-04 DIAGNOSIS — R7309 Other abnormal glucose: Secondary | ICD-10-CM | POA: Diagnosis not present

## 2019-07-04 DIAGNOSIS — E559 Vitamin D deficiency, unspecified: Secondary | ICD-10-CM | POA: Diagnosis not present

## 2019-07-04 DIAGNOSIS — R6889 Other general symptoms and signs: Secondary | ICD-10-CM | POA: Diagnosis not present

## 2019-07-04 DIAGNOSIS — R7989 Other specified abnormal findings of blood chemistry: Secondary | ICD-10-CM | POA: Diagnosis not present

## 2019-07-04 DIAGNOSIS — E538 Deficiency of other specified B group vitamins: Secondary | ICD-10-CM | POA: Diagnosis not present

## 2019-07-09 NOTE — Telephone Encounter (Signed)
Called and patient states she is going to finish what she has of Lipitor and then will start crestor.

## 2019-07-11 DIAGNOSIS — Z79899 Other long term (current) drug therapy: Secondary | ICD-10-CM | POA: Diagnosis not present

## 2019-07-11 DIAGNOSIS — Z2821 Immunization not carried out because of patient refusal: Secondary | ICD-10-CM | POA: Diagnosis not present

## 2019-07-11 DIAGNOSIS — Z7982 Long term (current) use of aspirin: Secondary | ICD-10-CM | POA: Diagnosis not present

## 2019-07-11 DIAGNOSIS — Z6841 Body Mass Index (BMI) 40.0 and over, adult: Secondary | ICD-10-CM | POA: Diagnosis not present

## 2019-07-11 DIAGNOSIS — E119 Type 2 diabetes mellitus without complications: Secondary | ICD-10-CM | POA: Diagnosis not present

## 2019-07-11 DIAGNOSIS — Z794 Long term (current) use of insulin: Secondary | ICD-10-CM | POA: Diagnosis not present

## 2019-07-17 DIAGNOSIS — G4733 Obstructive sleep apnea (adult) (pediatric): Secondary | ICD-10-CM | POA: Diagnosis not present

## 2019-07-24 DIAGNOSIS — L600 Ingrowing nail: Secondary | ICD-10-CM | POA: Diagnosis not present

## 2019-07-24 DIAGNOSIS — Z794 Long term (current) use of insulin: Secondary | ICD-10-CM | POA: Diagnosis not present

## 2019-07-24 DIAGNOSIS — E1142 Type 2 diabetes mellitus with diabetic polyneuropathy: Secondary | ICD-10-CM | POA: Diagnosis not present

## 2019-07-24 DIAGNOSIS — L853 Xerosis cutis: Secondary | ICD-10-CM | POA: Diagnosis not present

## 2019-08-06 DIAGNOSIS — Z1231 Encounter for screening mammogram for malignant neoplasm of breast: Secondary | ICD-10-CM | POA: Diagnosis not present

## 2019-08-06 LAB — HM MAMMOGRAPHY

## 2019-08-17 DIAGNOSIS — G4733 Obstructive sleep apnea (adult) (pediatric): Secondary | ICD-10-CM | POA: Diagnosis not present

## 2019-08-19 ENCOUNTER — Encounter: Payer: Self-pay | Admitting: Osteopathic Medicine

## 2019-08-19 NOTE — Progress Notes (Signed)
Assessment/Plan:    1.  Essential Tremor  -Patient went off of her medication (primidone) and tremor got much worse.  Restarted over the last 2 days and went to twice daily dosing.  She has not tolerated that dosing in the past.  She reports she is tolerating it right now, and daughter notes that tremor is much better than it was when she was off of the medication.  She will continue that for now.  Told her that if she finds she has not tolerated, to take both tablets at bedtime.  2.  OSAS  -Patient had nocturnal polysomnogram completed.  BiPAP was recommended.  Patient was referred to pulmonary for management.  Patient has an appointment in 2 weeks.  3.  Dementia  -This was demonstrated on neurocognitive testing with Dr. Si Raider in March, 2019, along with severe anxiety and depression, for which she [facility].  Memory changes is very mild today.  Subjective:   Dominique Russell was seen today in follow up for essential tremor.  My previous records were reviewed prior to todays visit.  This patient is accompanied in the office by her child who supplements the history.   Pt had nocturnal polysomnogram since last visit.  Trial of BiPAP was recommended.  [facility]  We also sent a referral to pulmonary for management of the BiPAP. Pt is wearing it but states that she takes it off and on trying to adjust it and "it keeps me awake."   Patient has not been seen there and I called my office to figure out why.  It turns out that they never contacted her regarding the referral.  When we contacted them, they did make her an appointment for a few weeks from now.  Reports tremor is much worse.  Lost her primidone for about a month and just restarted it.  Primidone restarted.  It did help.  She is now using it and states that she is now taking it bid over the last 2-3 days.  Is tolerating this, even though she has not tolerated it in the  past.  Current prescribed movement disorder medications: Primidone, 50 mg daily.   PREVIOUS MEDICATIONS: Current/Previously tried tremor medications:primidone (states higher dosages caused memory change), topamax (personality change), gabapentin (was on it for neuropathy); lyrica; artane (pt stated didn't help - no SE and I never saw pt on it as d/c before came back); propranolol (pt felt made her worse); lamictal (placed on by psych but states it caused tremor)  ALLERGIES:  No Known Allergies  CURRENT MEDICATIONS:  Outpatient Encounter Medications as of 08/21/2019  Medication Sig   acetaminophen (TYLENOL) 650 MG CR tablet Take 650 mg by mouth every 8 (eight) hours as needed for pain.   amLODipine (NORVASC) 5 MG tablet Take 1 tablet (5 mg total) by mouth daily.   B-D UF III MINI PEN NEEDLES 31G X 5 MM MISC USE AS DIRECTED WITH INSULIN AND VICTOZA.   Cholecalciferol (VITAMIN D-3) 25 MCG (1000 UT) CAPS Take 2 capsules (2,000 Units total) by mouth daily.   clobetasol ointment (TEMOVATE) 0.99 % Apply 1 application topically 2 (two) times daily. To affected area(s) as needed, max 2-3 weeks to avoid whitening/thinning skin   cyclobenzaprine (FLEXERIL) 10 MG tablet One half tab PO qHS, then increase gradually to one tab TID.   diclofenac sodium (VOLTAREN) 1 % GEL Place onto the skin.   DULoxetine (CYMBALTA)  60 MG capsule Take 1 capsule (60 mg total) by mouth daily. MUST MAKE APPOINTMENT   folic acid (FOLVITE) 275 MCG tablet Take 400 mcg by mouth daily.   hydrochlorothiazide (HYDRODIURIL) 25 MG tablet Take 1 tablet (25 mg total) by mouth daily.   insulin aspart protamine- aspart (NOVOLOG MIX 70/30) (70-30) 100 UNIT/ML injection Inject into the skin.   ipratropium (ATROVENT) 0.06 % nasal spray Place 2 sprays into both nostrils 4 (four) times daily.   liraglutide (VICTOZA) 18 MG/3ML SOPN Inject 1.8 mg into the skin.   LYRICA 150 MG capsule Take 1 tablet by mouth 2 (two) times daily.     metFORMIN (GLUCOPHAGE-XR) 500 MG 24 hr tablet Take 500 mg by mouth every morning.   Omega-3 Fatty Acids (FISH OIL) 1000 MG CAPS Take 1 capsule by mouth daily.    ONE TOUCH ULTRA TEST test strip    primidone (MYSOLINE) 50 MG tablet Take 1 tablet (50 mg total) by mouth at bedtime.   traZODone (DESYREL) 150 MG tablet Take 1 tablet (150 mg total) by mouth at bedtime.   valsartan (DIOVAN) 320 MG tablet Take 1 tablet (320 mg total) by mouth daily.   vitamin B-12 (CYANOCOBALAMIN) 1000 MCG tablet Take 1,000 mcg by mouth daily.    Vitamin D, Ergocalciferol, (DRISDOL) 1.25 MG (50000 UNIT) CAPS capsule Take 50,000 Units by mouth once a week.   [DISCONTINUED] ARIPiprazole (ABILIFY) 10 MG tablet Take 10 mg by mouth daily. (Patient not taking: Reported on 08/21/2019)   [DISCONTINUED] buPROPion (WELLBUTRIN) 75 MG tablet Take 1 tablet by mouth in the morning and at bedtime. (Patient not taking: Reported on 08/21/2019)   [DISCONTINUED] fluticasone (FLONASE) 50 MCG/ACT nasal spray Place 1-2 sprays into both nostrils daily. (Patient not taking: Reported on 08/21/2019)   [DISCONTINUED] meloxicam (MOBIC) 15 MG tablet Take 15 mg by mouth daily. (Patient not taking: Reported on 08/21/2019)   [DISCONTINUED] omeprazole (PRILOSEC) 40 MG capsule Take 1 capsule (40 mg total) by mouth daily. (Patient not taking: Reported on 08/21/2019)   [DISCONTINUED] pioglitazone-metformin (ACTOPLUS MET) 15-850 MG per tablet Take 1 tablet by mouth 2 (two) times daily with a meal. (Patient not taking: Reported on 08/21/2019)   [DISCONTINUED] pramipexole (MIRAPEX) 0.25 MG tablet Take 1 tablet by mouth daily. (Patient not taking: Reported on 08/21/2019)   [DISCONTINUED] rosuvastatin (CRESTOR) 20 MG tablet Take 1 tablet (20 mg total) by mouth daily. (Patient not taking: Reported on 08/21/2019)   [DISCONTINUED] simethicone (MYLICON) 80 MG chewable tablet Chew 80 mg every 6 (six) hours as needed by mouth for flatulence. (Patient not  taking: Reported on 08/21/2019)   [DISCONTINUED] vitamin E 400 UNIT capsule Take 400 Units by mouth daily. (Patient not taking: Reported on 08/21/2019)   No facility-administered encounter medications on file as of 08/21/2019.     Objective:    PHYSICAL EXAMINATION:    VITALS:   Vitals:   08/21/19 1106  BP: (!) 146/88  Pulse: 92  SpO2: 96%  Weight: 247 lb (112 kg)  Height: _0  (1.6 m)    GEN:  The patient appears stated age and is in NAD. HEENT:  Normocephalic, atraumatic.  The mucous membranes are moist. The superficial temporal arteries are without ropiness or tenderness. CV:  RRR Lungs:  CTAB Neck/HEME:  There are no carotid bruits bilaterally.  Neurological examination:  Orientation: The patient is alert and oriented x3. Cranial nerves: There is good facial symmetry. The speech is fluent and clear. Soft palate rises symmetrically and there is  no tongue deviation. Hearing is intact to conversational tone. Sensation: Sensation is intact to light touch throughout Motor: Strength is at least antigravity x4.  Movement examination: Tone: There is normal tone in the UE/LE Abnormal movements: No significant postural tremor.  She does have tremor that is fairly severe when given a weight when the left arm is held in the proximal position.  No significant tremor in the proximal position on the right arm. Coordination:  There is no decremation with RAM's  I have reviewed and interpreted the following labs independently   Chemistry      Component Value Date/Time   NA 139 05/21/2019 1123   K 4.3 05/21/2019 1123   CL 104 05/21/2019 1123   CO2 27 05/21/2019 1123   BUN 22 05/21/2019 1123   BUN 24 (A) 07/20/2018 0000   CREATININE 1.24 (H) 05/21/2019 1123   GLU 84 07/20/2018 0000      Component Value Date/Time   CALCIUM 9.2 05/21/2019 1123   ALKPHOS 52 07/08/2016 1020   AST 18 05/21/2019 1123   ALT 16 05/21/2019 1123   BILITOT 0.3 05/21/2019 1123      Lab Results   Component Value Date   WBC 4.6 05/21/2019   HGB 11.9 05/21/2019   HCT 37.5 05/21/2019   MCV 88.9 05/21/2019   PLT 183 05/21/2019   Lab Results  Component Value Date   TSH 0.78 07/20/2018     Chemistry      Component Value Date/Time   NA 139 05/21/2019 1123   K 4.3 05/21/2019 1123   CL 104 05/21/2019 1123   CO2 27 05/21/2019 1123   BUN 22 05/21/2019 1123   BUN 24 (A) 07/20/2018 0000   CREATININE 1.24 (H) 05/21/2019 1123   GLU 84 07/20/2018 0000      Component Value Date/Time   CALCIUM 9.2 05/21/2019 1123   ALKPHOS 52 07/08/2016 1020   AST 18 05/21/2019 1123   ALT 16 05/21/2019 1123   BILITOT 0.3 05/21/2019 1123         Total time spent on today's visit was 30 minutes, including both face-to-face time and nonface-to-face time.  Time included that spent on review of records (prior notes available to me/labs/imaging if pertinent), discussing treatment and goals, answering patient's questions and coordinating care.  Cc:  [name], DO

## 2019-08-21 ENCOUNTER — Ambulatory Visit (INDEPENDENT_AMBULATORY_CARE_PROVIDER_SITE_OTHER): Payer: Medicare Other | Admitting: Neurology

## 2019-08-21 ENCOUNTER — Other Ambulatory Visit: Payer: Self-pay

## 2019-08-21 ENCOUNTER — Encounter: Payer: Self-pay | Admitting: Neurology

## 2019-08-21 VITALS — BP 146/88 | HR 92 | Ht 63.0 in | Wt 247.0 lb

## 2019-08-21 DIAGNOSIS — G4733 Obstructive sleep apnea (adult) (pediatric): Secondary | ICD-10-CM

## 2019-08-21 DIAGNOSIS — F0150 Vascular dementia without behavioral disturbance: Secondary | ICD-10-CM

## 2019-08-21 DIAGNOSIS — G250 Essential tremor: Secondary | ICD-10-CM | POA: Diagnosis not present

## 2019-08-21 MED ORDER — PRIMIDONE 50 MG PO TABS: 50.0000 mg | ORAL_TABLET | Freq: Two times a day (BID) | ORAL | 2 refills | Status: DC

## 2019-08-21 NOTE — Patient Instructions (Signed)
Take primidone - 50 mg - 1 in the AM and 1 in the evening.  If you don't tolerate it this way, take BOTH tablets at bedtime.  The physicians and staff at [facility] are committed to providing excellent care. You may receive a survey requesting feedback about your experience at our office. We strive to receive "very good" responses to the survey questions. If you feel that your experience would prevent you from giving the office a "very good " response, please contact our office to try to remedy the situation. We may be reached at [phone]. Thank you for taking the time out of your busy day to complete the survey.

## 2019-09-02 ENCOUNTER — Telehealth: Payer: Self-pay | Admitting: Internal Medicine

## 2019-09-02 ENCOUNTER — Encounter: Payer: Self-pay | Admitting: Internal Medicine

## 2019-09-02 ENCOUNTER — Other Ambulatory Visit: Payer: Self-pay

## 2019-09-02 ENCOUNTER — Ambulatory Visit (INDEPENDENT_AMBULATORY_CARE_PROVIDER_SITE_OTHER): Payer: Medicare Other | Admitting: Internal Medicine

## 2019-09-02 VITALS — BP 100/58 | HR 94 | Temp 98.2°F | Ht 63.0 in | Wt 236.8 lb

## 2019-09-02 DIAGNOSIS — G4733 Obstructive sleep apnea (adult) (pediatric): Secondary | ICD-10-CM

## 2019-09-02 DIAGNOSIS — Z6841 Body Mass Index (BMI) 40.0 and over, adult: Secondary | ICD-10-CM | POA: Diagnosis not present

## 2019-09-02 NOTE — Patient Instructions (Signed)
Order- referral to sleep disorders center for mask fitting    Dx OSA  We will identify your DME home care company that provides your PAP machine so we can get a download and see if there are adjustments that need to be made.  Please call if we can help

## 2019-09-02 NOTE — Progress Notes (Signed)
09/02/19- 5 yoF former smoker for sleep evaluation Medical problem list includes HTN, DM2, Tremor, Rheumatoid Arthritis, Psoriasis, Osteoarthritis, Morbid Obesity, Situational Anxiety, CKD NPSG 01/22/2019- AHI 11.4/ hr, desaturation to 84%- required supplemental O2 @1L /min, body weight 245 lbs CPAP titration 03/05/19- CPAP inadequate, needed BIPAP 25/22 with min sat 90%. BIPAP- 25/22 ( Air[employer]ve 10), no chip/ [employer] Epworth score- 14 Body weight today- 236 lbs Since sleep study has tried to lose some weight. Watching impact on snoring and daytime tiredness.  No hx ENT surgery, lung or heart disease. Aware of loud snoring.  Prior to Admission medications   Medication Sig Start Date End Date Taking? Authorizing Provider  acetaminophen (TYLENOL) 650 MG CR tablet Take 650 mg by mouth every 8 (eight) hours as needed for pain.   Yes [provider]  B-D UF III MINI PEN NEEDLES 31G X 5 MM MISC USE AS DIRECTED WITH INSULIN AND VICTOZA. 02/10/16  Yes [provider]  Cholecalciferol (VITAMIN D-3) 25 MCG (1000 UT) CAPS Take 2 capsules (2,000 Units total) by mouth daily. 05/21/19  Yes Early, Sung Amabile, NP  clobetasol ointment (TEMOVATE) 0.05 % Apply 1 application topically 2 (two) times daily. To affected area(s) as needed, max 2-3 weeks to avoid whitening/thinning skin 03/16/16  Yes Sunnie Nielsen, DO  cyclobenzaprine (FLEXERIL) 10 MG tablet One half tab PO qHS, then increase gradually to one tab TID. 07/27/18  Yes Monica Becton, MD  diclofenac sodium (VOLTAREN) 1 % GEL Place onto the skin. 07/23/14  Yes [provider]  DULoxetine (CYMBALTA) 60 MG capsule TAKE 1 CAPSULE (60 MG TOTAL) BY MOUTH DAILY. MUST MAKE APPOINTMENT 10/31/19   Sunnie Nielsen, DO  folic acid (FOLVITE) 800 MCG tablet Take 400 mcg by mouth daily.   Yes [provider]  hydrochlorothiazide (HYDRODIURIL) 25 MG tablet Take 1 tablet (25 mg total) by mouth daily. 05/21/19  Yes Early, Sung Amabile, NP  insulin  aspart protamine- aspart (NOVOLOG MIX 70/30) (70-30) 100 UNIT/ML injection Inject into the skin.   Yes [provider]  ipratropium (ATROVENT) 0.06 % nasal spray Place 2 sprays into both nostrils 4 (four) times daily. 05/21/19  Yes Early, Sung Amabile, NP  liraglutide (VICTOZA) 18 MG/3ML SOPN Inject 1.8 mg into the skin. 01/25/16  Yes [provider]  LYRICA 150 MG capsule Take 1 tablet by mouth 2 (two) times daily.  01/08/14  Yes [provider]  metFORMIN (GLUCOPHAGE-XR) 500 MG 24 hr tablet Take 500 mg by mouth every morning. 02/21/19  Yes [provider]  Omega-3 Fatty Acids (FISH OIL) 1000 MG CAPS Take 1 capsule by mouth daily.    Yes [provider]  ONE TOUCH ULTRA TEST test strip  12/17/15  Yes [provider]  traZODone (DESYREL) 150 MG tablet Take 1 tablet (150 mg total) by mouth at bedtime. 10/11/18  Yes Sunnie Nielsen, DO  valsartan (DIOVAN) 320 MG tablet Take 1 tablet (320 mg total) by mouth daily. 05/21/19  Yes Early, Sung Amabile, NP  vitamin B-12 (CYANOCOBALAMIN) 1000 MCG tablet Take 1,000 mcg by mouth daily.    Yes [provider]  Vitamin D, Ergocalciferol, (DRISDOL) 1.25 MG (50000 UNIT) CAPS capsule Take 50,000 Units by mouth once a week. 02/28/19  Yes [provider]  amLODipine (NORVASC) 5 MG tablet TAKE 1 TABLET BY MOUTH EVERY DAY 11/18/19   Early, Sung Amabile, NP  amoxicillin-clavulanate (AUGMENTIN) 875-125 MG tablet Take 1 tablet by mouth 2 (two) times daily. 09/04/19   Everrett Coombe,  DO  omeprazole (PRILOSEC) 40 MG capsule Take 1 capsule (40 mg total) by mouth daily. **PATIENT NEEDS OFFICE VISIT FOR ADDITIONAL REFILLS** 11/18/19   Sunnie Nielsen, DO  primidone (MYSOLINE) 50 MG tablet 2 TABLETS IN AM AND 1 TABLET IN PM 09/13/19   Van Clines, MD   Past Medical History:  Diagnosis Date   Diabetes [facility])    Essential tremor    Fibromyalgia    Hypertension    Past Surgical History:  Procedure Laterality Date    BREAST REDUCTION SURGERY     Family History  Problem Relation Age of Onset   Depression Mother    Hypertension Mother    Kidney failure Brother    Other Sister        MVA   Tremor Neg Hx    Social History   Socioeconomic History   Marital status: Legally Separated    Spouse name: Not on file   Number of children: 2   Years of education: HS   Highest education level: Not on file  Occupational History   Not on file  Tobacco Use   Smoking status: Former Smoker    Quit date: 02/22/1995    Years since quitting: 24.7   Smokeless tobacco: Never Used  Vaping Use   Vaping Use: Never used  Substance and Sexual Activity   Alcohol use: No   Drug use: No   Sexual activity: Not Currently  Other Topics Concern   Not on file  Social History Narrative   Patient lives at home with her daughter.   Caffeine Use: drinks regurlarly   Social Determinants of Health   Financial Resource Strain:    Difficulty of Paying Living Expenses: Not on file  Food Insecurity:    Worried About Programme researcher, broadcasting/film/video in the Last Year: Not on file   [employer] of Food in the Last Year: Not on file  Transportation Needs:    Lack of Transportation (Medical): Not on file   Lack of Transportation (Non-Medical): Not on file  Physical Activity:    Days of Exercise per Week: Not on file   Minutes of Exercise per Session: Not on file  Stress:    Feeling of Stress : Not on file  Social Connections:    Frequency of Communication with Friends and Family: Not on file   Frequency of Social Gatherings with Friends and Family: Not on file   Attends Religious Services: Not on file   Active Member of Clubs or Organizations: Not on file   Attends Banker Meetings: Not on file   Marital Status: Not on file  Intimate Partner Violence:    Fear of Current or Ex-Partner: Not on file   Emotionally Abused: Not on file   Physically Abused: Not on file   Sexually Abused: Not on  file   ROS-see HPI  + = positive Constitutional:    weight loss, night sweats, fevers, chills, fatigue, lassitude. HEENT:    headaches, difficulty swallowing, tooth/dental problems, sore throat,       sneezing, itching, ear ache, nasal congestion, post nasal drip, snoring CV:    chest pain, orthopnea, PND, swelling in lower extremities, anasarca,                                   dizziness, palpitations Resp:   shortness of breath with exertion or at rest.  productive cough,   non-productive cough, coughing up of blood.              change in color of mucus.  wheezing.   Skin:    rash or lesions. GI:  No-   heartburn, indigestion, abdominal pain, nausea, vomiting, diarrhea,                 change in bowel habits, loss of appetite GU: dysuria, change in color of urine, no urgency or frequency.   flank pain. MS:   joint pain, stiffness, decreased range of motion, back pain. Neuro-     nothing unusual Psych:  change in mood or affect.  depression or anxiety.   memory loss.  OBJ- Physical Exam General- Alert, Oriented, Affect-appropriate, Distress- none acute, + obese Skin- rash-none, lesions- none, excoriation- none Lymphadenopathy- none Head- atraumatic            Eyes- Gross vision intact, PERRLA, conjunctivae and secretions clear            Ears- Hearing, canals-normal            Nose- Clear, no-Septal dev, mucus, polyps, erosion, perforation             Throat- Mallampati III-IV , mucosa clear , drainage- none, tonsils- atrophic, + teeth Neck- flexible , trachea midline, no stridor , thyroid nl, carotid no bruit Chest - symmetrical excursion , unlabored           Heart/CV- RRR , no murmur , no gallop  , no rub, nl s1 s2                           - JVD- none , edema- none, stasis changes- none, varices- none           Lung- clear to P&A, wheeze- none, cough- none , dullness-none, rub- none           Chest wall-  Abd-  Br/ Gen/ Rectal- Not done, not indicated Extrem-  cyanosis- none, clubbing, none, atrophy- none, strength- nl Neuro- grossly intact to observation

## 2019-09-02 NOTE — Telephone Encounter (Signed)
Attempted to call [employer] to see if we can get pt added to [facility]. Was on hold x10 minutes and never could get anyone on the phone. Will try to call back later.

## 2019-09-03 DIAGNOSIS — M170 Bilateral primary osteoarthritis of knee: Secondary | ICD-10-CM | POA: Diagnosis not present

## 2019-09-03 DIAGNOSIS — M549 Dorsalgia, unspecified: Secondary | ICD-10-CM | POA: Diagnosis not present

## 2019-09-03 DIAGNOSIS — M25561 Pain in right knee: Secondary | ICD-10-CM | POA: Diagnosis not present

## 2019-09-03 DIAGNOSIS — G894 Chronic pain syndrome: Secondary | ICD-10-CM | POA: Diagnosis not present

## 2019-09-03 NOTE — Telephone Encounter (Signed)
LMTCB x1 for pt as she was the original caller, not [employer].

## 2019-09-04 ENCOUNTER — Ambulatory Visit (INDEPENDENT_AMBULATORY_CARE_PROVIDER_SITE_OTHER): Payer: BC Managed Care – PPO | Admitting: Family Medicine

## 2019-09-04 ENCOUNTER — Encounter: Payer: Self-pay | Admitting: Family Medicine

## 2019-09-04 DIAGNOSIS — H66002 Acute suppurative otitis media without spontaneous rupture of ear drum, left ear: Secondary | ICD-10-CM | POA: Diagnosis not present

## 2019-09-04 DIAGNOSIS — H6692 Otitis media, unspecified, left ear: Secondary | ICD-10-CM | POA: Insufficient documentation

## 2019-09-04 MED ORDER — AMOXICILLIN-POT CLAVULANATE 875-125 MG PO TABS: 1.0000 | ORAL_TABLET | Freq: Two times a day (BID) | ORAL | 0 refills | Status: DC

## 2019-09-04 NOTE — Progress Notes (Signed)
Dominique Russell - 64 y.o. female MRN [id number]  Date of birth: 03-04-1955  Subjective Chief Complaint  Patient presents with  . Otalgia    HPI Dominique Russell is a 64 y.o. female here today with complain of L ear pain.  Pain is similar to pain she experienced in 09/2018.  Treated with augmentin at that time with resolution of symptoms.  She has had some issues with eustachian tube dysfunction previously as well, treated with atrovent nasal spray.  She denies fever, chills, nausea, dizziness, or jaw pain.  She has had occasional headache.  ROS:  A comprehensive ROS was completed and negative except as noted per HPI  No Known Allergies  Past Medical History:  Diagnosis Date  . Diabetes (HCC)   . Essential tremor   . Fibromyalgia   . Hypertension     Past Surgical History:  Procedure Laterality Date  . BREAST REDUCTION SURGERY      Social History   Socioeconomic History  . Marital status: Legally Separated    Spouse name: Not on file  . Number of children: 2  . Years of education: HS  . Highest education level: Not on file  Occupational History  . Not on file  Tobacco Use  . Smoking status: Former Smoker    Quit date: 02/22/1995    Years since quitting: 24.5  . Smokeless tobacco: Never Used  Vaping Use  . Vaping Use: Never used  Substance and Sexual Activity  . Alcohol use: No  . Drug use: No  . Sexual activity: Not Currently  Other Topics Concern  . Not on file  Social History Narrative   Patient lives at home with her daughter.   Caffeine Use: drinks regurlarly   Social Determinants of Health   Financial Resource Strain:   . Difficulty of Paying Living Expenses:   Food Insecurity:   . Worried About Programme researcher, broadcasting/film/video in the Last Year:   . Barista in the Last Year:   Transportation Needs:   . Freight forwarder (Medical):   Marland Kitchen Lack of Transportation (Non-Medical):   Physical Activity:   . Days of Exercise per Week:   . Minutes of Exercise  per Session:   Stress:   . Feeling of Stress :   Social Connections:   . Frequency of Communication with Friends and Family:   . Frequency of Social Gatherings with Friends and Family:   . Attends Religious Services:   . Active Member of Clubs or Organizations:   . Attends Banker Meetings:   Marland Kitchen Marital Status:     Family History  Problem Relation Age of Onset  . Depression Mother   . Hypertension Mother   . Kidney failure Brother   . Other Sister        MVA  . Tremor Neg Hx     Health Maintenance  Topic Date Due  . OPHTHALMOLOGY EXAM  08/27/2019  . INFLUENZA VACCINE  09/22/2019  . HEMOGLOBIN A1C  11/21/2019  . FOOT EXAM  05/20/2020  . MAMMOGRAM  08/05/2021  . PNA vac Low Risk Adult (2 of 2 - PPSV23) 09/14/2021  . COLONOSCOPY  02/22/2024  . TETANUS/TDAP  09/15/2026  . DEXA SCAN  Completed  . COVID-19 Vaccine  Completed  . Hepatitis C Screening  Completed  . HIV Screening  Completed  . PAP SMEAR-Modifier  Discontinued     ----------------------------------------------------------------------------------------------------------------------------------------------------------------------------------------------------------------- Physical Exam BP 123/63 (BP Location: Left Arm, Patient Position:  Sitting, Cuff Size: Normal)   Pulse (!) 102   Wt 238 lb 1.9 oz (108 kg)   BMI 42.18 kg/m   Physical Exam Constitutional:      Appearance: Normal appearance.  HENT:     Right Ear: Tympanic membrane and ear canal normal.     Left Ear: Ear canal normal.     Ears:     Comments: TM erythematous with effusion  Eyes:     General: No scleral icterus. Cardiovascular:     Rate and Rhythm: Normal rate and regular rhythm.  Pulmonary:     Effort: Pulmonary effort is normal.     Breath sounds: Normal breath sounds.  Musculoskeletal:     Cervical back: Neck supple.  Neurological:     General: No focal deficit present.     Mental Status: She is alert.  Psychiatric:         Mood and Affect: Mood normal.        Behavior: Behavior normal.     ------------------------------------------------------------------------------------------------------------------------------------------------------------------------------------------------------------------- Assessment and Plan  Otitis media, left She responded well to augmentin previously.  Start augmentin x10 days   Instructed to return to recheck if not resolving after antibiotic.   Meds ordered this encounter  Medications  . amoxicillin-clavulanate (AUGMENTIN) 875-125 MG tablet    Sig: Take 1 tablet by mouth 2 (two) times daily.    Dispense:  20 tablet    Refill:  0    No follow-ups on file.    This visit occurred during the SARS-CoV-2 public health emergency.  Safety protocols were in place, including screening questions prior to the visit, additional usage of staff PPE, and extensive cleaning of exam room while observing appropriate contact time as indicated for disinfecting solutions.

## 2019-09-04 NOTE — Patient Instructions (Signed)
Otitis Media, Adult  Otitis media means that the middle ear is red and swollen (inflamed) and full of fluid. The condition usually goes away on its own. Follow these instructions at home:  Take over-the-counter and prescription medicines only as told by your doctor.  If you were prescribed an antibiotic medicine, take it as told by your doctor. Do not stop taking the antibiotic even if you start to feel better.  Keep all follow-up visits as told by your doctor. This is important. Contact a doctor if:  You have bleeding from your nose.  There is a lump on your neck.  You are not getting better in 5 days.  You feel worse instead of better. Get help right away if:  You have pain that is not helped with medicine.  You have swelling, redness, or pain around your ear.  You get a stiff neck.  You cannot move part of your face (paralyzed).  You notice that the bone behind your ear hurts when you touch it.  You get a very bad headache. Summary  Otitis media means that the middle ear is red, swollen, and full of fluid.  This condition usually goes away on its own. In some cases, treatment may be needed.  If you were prescribed an antibiotic medicine, take it as told by your doctor. This information is not intended to replace advice given to you by your health care provider. Make sure you discuss any questions you have with your health care provider. Document Revised: 01/20/2017 Document Reviewed: 02/29/2016 Elsevier Patient Education  2020 Elsevier Inc.  

## 2019-09-04 NOTE — Assessment & Plan Note (Signed)
She responded well to augmentin previously.  Start augmentin x10 days   Instructed to return to recheck if not resolving after antibiotic.

## 2019-09-04 NOTE — Telephone Encounter (Signed)
LMTCB x2 for pt 

## 2019-09-05 NOTE — Telephone Encounter (Signed)
lmtcb x 3  It looks like Dr Maple Hudson wanted to know her DME so that we would obtain a DL  Called [facility] and placed on a long hold  Sent community msg to [place] to see about getting her added to [employer]

## 2019-09-11 ENCOUNTER — Ambulatory Visit (HOSPITAL_BASED_OUTPATIENT_CLINIC_OR_DEPARTMENT_OTHER): Payer: Medicare Other | Attending: Internal Medicine | Admitting: Internal Medicine

## 2019-09-11 ENCOUNTER — Other Ambulatory Visit: Payer: Self-pay

## 2019-09-11 DIAGNOSIS — Z713 Dietary counseling and surveillance: Secondary | ICD-10-CM | POA: Diagnosis not present

## 2019-09-11 DIAGNOSIS — G4733 Obstructive sleep apnea (adult) (pediatric): Secondary | ICD-10-CM

## 2019-09-11 DIAGNOSIS — Z794 Long term (current) use of insulin: Secondary | ICD-10-CM | POA: Diagnosis not present

## 2019-09-11 DIAGNOSIS — E119 Type 2 diabetes mellitus without complications: Secondary | ICD-10-CM | POA: Diagnosis not present

## 2019-09-13 ENCOUNTER — Other Ambulatory Visit: Payer: Self-pay | Admitting: Neurology

## 2019-09-13 MED ORDER — PRIMIDONE 50 MG PO TABS: 50.0000 mg | ORAL_TABLET | Freq: Two times a day (BID) | ORAL | 1 refills | Status: DC

## 2019-09-13 MED ORDER — PRIMIDONE 50 MG PO TABS: ORAL_TABLET | ORAL | 0 refills | Status: DC

## 2019-09-13 NOTE — Addendum Note (Signed)
Addended by: Kathie Dike A on: 09/13/2019 03:18 PM   Modules accepted: Orders

## 2019-09-13 NOTE — Telephone Encounter (Signed)
Pls let patient know that Dr. Arbutus Leas is out until Aug 2, I reviewed notes, the highest Dr. Arbutus Leas went with the Primidone 50mg  was 2 tabs in AM, 1 tab in PM, and the patient told her she was having more memory problems. We can go back to 2 tabs in AM, 1 tab in PM, but would discuss higher dose with Dr. . Thanks

## 2019-09-13 NOTE — Telephone Encounter (Signed)
Left VM telling pt that Dr Karel Jarvis stated she can take primidone 2 tabs in AM and 1 tab in PM as was previously prescribed but any further dose adjustments will need to be addressed with Dr Tat when she returns to the office.

## 2019-09-13 NOTE — Telephone Encounter (Signed)
Pt states that she is taking 4 pills daily of the primidone and needs new RX sent in to the [facility] on [address]

## 2019-09-13 NOTE — Telephone Encounter (Signed)
Spoke with pt because the message received for primidone refill said she is taking 4 tabs daily, she says the bottle for primidone 50 mg says take 1 in the AM and 2 in the PM. The script in the chart says 1 BID. I told pt that per Dt Tat's last note she is to take 1 BID and that a refill be ordered that way and told her a note would be sent to Dr Tat/covering provider to address this. Reiterated for the pt that Dr Don Perking note and prescription in her chart says to take 1 tablet BID. Pt states she would prefer to take primidone 50 mg 2 tabs BID.

## 2019-09-16 DIAGNOSIS — G4733 Obstructive sleep apnea (adult) (pediatric): Secondary | ICD-10-CM | POA: Diagnosis not present

## 2019-09-19 NOTE — Telephone Encounter (Signed)
Pt called no answer voice mail left to call the office back  

## 2019-09-19 NOTE — Telephone Encounter (Signed)
Leave dosage of primidone where Dr. Karel Jarvis left it.  Pt consistently complains of memory change with this medication as it is adjusted.

## 2019-09-20 NOTE — Telephone Encounter (Signed)
Spoke with pt informed her that Dr Tat agrees to take primidone 2 tabs in AM and 1 tab in PM. Pt verbalized understanding

## 2019-09-26 DIAGNOSIS — Z01419 Encounter for gynecological examination (general) (routine) without abnormal findings: Secondary | ICD-10-CM | POA: Diagnosis not present

## 2019-10-17 DIAGNOSIS — G4733 Obstructive sleep apnea (adult) (pediatric): Secondary | ICD-10-CM | POA: Diagnosis not present

## 2019-10-23 ENCOUNTER — Telehealth: Payer: Self-pay | Admitting: Neurology

## 2019-10-23 NOTE — Telephone Encounter (Signed)
Patient called and said, "I met with my social worker today and she told me to call your office and ask Dr. Tat to complete an FL2 form for me to get assistance with cooking, cleaning, feeding, my medicines, and bathing." 

## 2019-10-24 NOTE — Telephone Encounter (Signed)
As far as I know medicare doesn't pay for feeding/bathing/assist and we no longer have a Child psychotherapist to help in this office to help with these things.  Will need to contact pcp

## 2019-10-24 NOTE — Telephone Encounter (Signed)
Spoke with patient and explained patient that she needs to speak with her pcp because we no longer have a Child psychotherapist in our office. And this is a Chiropractor. Patient states her social worker told her to contact Dr Tat. Advised patient   Patient voiced understanding.

## 2019-10-31 ENCOUNTER — Other Ambulatory Visit: Payer: Self-pay | Admitting: Osteopathic Medicine

## 2019-10-31 DIAGNOSIS — M797 Fibromyalgia: Secondary | ICD-10-CM

## 2019-10-31 NOTE — Telephone Encounter (Signed)
Last ov- 05/21/2019  Last refill- 02/21/2019

## 2019-11-07 DIAGNOSIS — H524 Presbyopia: Secondary | ICD-10-CM | POA: Diagnosis not present

## 2019-11-07 DIAGNOSIS — H16223 Keratoconjunctivitis sicca, not specified as Sjogren's, bilateral: Secondary | ICD-10-CM | POA: Diagnosis not present

## 2019-11-17 ENCOUNTER — Other Ambulatory Visit: Payer: Self-pay | Admitting: Nurse Practitioner

## 2019-11-17 DIAGNOSIS — I10 Essential (primary) hypertension: Secondary | ICD-10-CM

## 2019-11-17 DIAGNOSIS — G4733 Obstructive sleep apnea (adult) (pediatric): Secondary | ICD-10-CM | POA: Diagnosis not present

## 2019-11-18 ENCOUNTER — Other Ambulatory Visit: Payer: Self-pay

## 2019-11-18 DIAGNOSIS — K219 Gastro-esophageal reflux disease without esophagitis: Secondary | ICD-10-CM

## 2019-11-18 MED ORDER — OMEPRAZOLE 40 MG PO CPDR: 40.0000 mg | DELAYED_RELEASE_CAPSULE | Freq: Every day | ORAL | 0 refills | Status: DC

## 2019-11-19 DIAGNOSIS — M170 Bilateral primary osteoarthritis of knee: Secondary | ICD-10-CM | POA: Diagnosis not present

## 2019-11-19 DIAGNOSIS — M549 Dorsalgia, unspecified: Secondary | ICD-10-CM | POA: Diagnosis not present

## 2019-11-19 DIAGNOSIS — M25561 Pain in right knee: Secondary | ICD-10-CM | POA: Diagnosis not present

## 2019-11-19 DIAGNOSIS — G894 Chronic pain syndrome: Secondary | ICD-10-CM | POA: Diagnosis not present

## 2019-11-19 NOTE — Assessment & Plan Note (Signed)
She needs encouragement to set her mind to long-term lifestyle change for meaningful weight loss. We encourage diet, exercise within her abilities.

## 2019-11-19 NOTE — Assessment & Plan Note (Addendum)
Needing high pressure and BIPAP. She will need support fo=rom [place] and DME to help her get settled with BIPAP.  Plan- Contact DME [facility] for download, for now continue BIPAP 25/22

## 2019-11-22 ENCOUNTER — Ambulatory Visit: Payer: Medicare Other | Admitting: Osteopathic Medicine

## 2019-11-25 ENCOUNTER — Other Ambulatory Visit: Payer: Self-pay

## 2019-11-25 ENCOUNTER — Encounter: Payer: Self-pay | Admitting: Osteopathic Medicine

## 2019-11-25 ENCOUNTER — Telehealth: Payer: Self-pay | Admitting: Osteopathic Medicine

## 2019-11-25 ENCOUNTER — Ambulatory Visit (INDEPENDENT_AMBULATORY_CARE_PROVIDER_SITE_OTHER): Payer: BC Managed Care – PPO | Admitting: Osteopathic Medicine

## 2019-11-25 VITALS — BP 123/83 | HR 80 | Wt 240.0 lb

## 2019-11-25 DIAGNOSIS — E1121 Type 2 diabetes mellitus with diabetic nephropathy: Secondary | ICD-10-CM

## 2019-11-25 DIAGNOSIS — Z23 Encounter for immunization: Secondary | ICD-10-CM | POA: Diagnosis not present

## 2019-11-25 DIAGNOSIS — Z794 Long term (current) use of insulin: Secondary | ICD-10-CM

## 2019-11-25 DIAGNOSIS — G250 Essential tremor: Secondary | ICD-10-CM | POA: Diagnosis not present

## 2019-11-25 NOTE — Progress Notes (Signed)
HPI: Dominique Russell is a 64 y.o. female who  has a past medical history of Diabetes (HCC), Essential tremor, Fibromyalgia, and Hypertension.  she presents to [facility] today, 11/25/19,  for chief complaint of: FL2 paperwork, follow up  Patient reports she has not been doing so well recently. She has been following with neurology for a hand tremor, which she has had for months and makes it difficult for her to eat, drink, check her blood sugar, and give her insulin injections. She states her neurologist gave her medication, but it has not helped. She discussed FL2 paperwork with her neurologist, who said her PCP should fill it out.  She states her mental health feels stable currently. 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 Endocrinology manages her DM medications. She states she has had difficulty controlling her DM as she is unable to check her sugars or give injections.   Past medical, surgical, social and family history reviewed:  Patient Active Problem List   Diagnosis Date Noted  . Otitis media, left 09/04/2019  . OSA (obstructive sleep apnea) 01/22/2019  . Bone disorder 09/06/2018  . Essential hypertension 03/17/2016  . Psoriasis 03/17/2016  . Long-term insulin use in type 2 diabetes (HCC) 02/09/2015  . Morbid obesity with BMI of 45.0-49.9, adult (HCC) 05/16/2014  . DM (diabetes mellitus) (HCC) 01/23/2013  . Low back pain 01/23/2013  . Shoulder pain 01/23/2013  . Bilateral knee pain 12/26/2012  . Bilateral shoulder pain 12/26/2012  . Essential tremor 09/26/2012  . Headache 09/26/2012  . Benign essential tremor 09/26/2012  . Fibromyalgia 03/02/2011  . Generalized osteoarthritis of multiple sites 03/02/2011  . Rheumatoid arthritis (HCC) 03/02/2011    Past Surgical History:  Procedure Laterality Date  .  BREAST REDUCTION SURGERY      Social History   Tobacco Use  . Smoking status: Former Smoker    Quit date: 02/22/1995    Years since quitting: 24.7  . Smokeless tobacco: Never Used  Substance Use Topics  . Alcohol use: No    Family History  Problem Relation Age of Onset  . Depression Mother   . Hypertension Mother   . Kidney failure Brother   . Other Sister        MVA  . Tremor Neg Hx      Current medication list and allergy/intolerance information reviewed:    Current Outpatient Medications  Medication Sig Dispense Refill  . acetaminophen (TYLENOL) 650 MG CR tablet Take 650 mg by mouth every 8 (eight) hours as needed for pain.    Marland Kitchen amLODipine (NORVASC) 5 MG tablet TAKE 1 TABLET BY MOUTH EVERY DAY 90 tablet 1  . B-D UF III MINI PEN NEEDLES 31G X 5 MM MISC USE AS DIRECTED WITH INSULIN AND VICTOZA.  11  . Cholecalciferol (VITAMIN D-3) 25 MCG (1000 UT) CAPS Take 2 capsules (2,000 Units total) by mouth daily. 180 capsule 3  . clobetasol ointment (TEMOVATE) 0.05 % Apply 1 application topically 2 (two) times daily. To affected area(s) as needed, max 2-3 weeks to avoid whitening/thinning skin 45 g 1  . cyclobenzaprine (FLEXERIL) 10 MG tablet One half tab PO qHS, then increase gradually to one tab TID. 30 tablet 0  . diclofenac sodium (VOLTAREN) 1 % GEL Place onto the skin.    . DULoxetine (CYMBALTA) 60 MG capsule  TAKE 1 CAPSULE (60 MG TOTAL) BY MOUTH DAILY. MUST MAKE APPOINTMENT 90 capsule 3  . folic acid (FOLVITE) 800 MCG tablet Take 400 mcg by mouth daily.    . hydrochlorothiazide (HYDRODIURIL) 25 MG tablet Take 1 tablet (25 mg total) by mouth daily. 90 tablet 3  . insulin aspart protamine- aspart (NOVOLOG MIX 70/30) (70-30) 100 UNIT/ML injection Inject into the skin.    Marland Kitchen ipratropium (ATROVENT) 0.06 % nasal spray Place 2 sprays into both nostrils 4 (four) times daily. 15 mL 5  . liraglutide (VICTOZA) 18 MG/3ML SOPN Inject 1.8 mg into the skin.    Marland Kitchen LYRICA 150 MG capsule Take 1  tablet by mouth 2 (two) times daily.     . metFORMIN (GLUCOPHAGE-XR) 500 MG 24 hr tablet Take 500 mg by mouth every morning.    . Omega-3 Fatty Acids (FISH OIL) 1000 MG CAPS Take 1 capsule by mouth daily.     Marland Kitchen omeprazole (PRILOSEC) 40 MG capsule Take 1 capsule (40 mg total) by mouth daily. **PATIENT NEEDS OFFICE VISIT FOR ADDITIONAL REFILLS** 30 capsule 0  . ONE TOUCH ULTRA TEST test strip     . primidone (MYSOLINE) 50 MG tablet 2 TABLETS IN AM AND 1 TABLET IN PM 270 tablet 0  . traZODone (DESYREL) 150 MG tablet Take 1 tablet (150 mg total) by mouth at bedtime. 90 tablet 3  . valsartan (DIOVAN) 320 MG tablet Take 1 tablet (320 mg total) by mouth daily. 90 tablet 1  . vitamin B-12 (CYANOCOBALAMIN) 1000 MCG tablet Take 1,000 mcg by mouth daily.     . Vitamin D, Ergocalciferol, (DRISDOL) 1.25 MG (50000 UNIT) CAPS capsule Take 50,000 Units by mouth once a week.    . cyclobenzaprine (FLEXERIL) 5 MG tablet Take 5 mg by mouth 2 (two) times daily.    Marland Kitchen RADIANCE PLATINUM VITAMIN D3 125 MCG (5000 UT) TABS Take 5,000 Units by mouth once a week.    . rosuvastatin (CRESTOR) 20 MG tablet Take 20 mg by mouth daily.     No current facility-administered medications for this visit.    No Known Allergies    Review of Systems:  Constitutional:  No  fever, no chills   HEENT: No  headache, no vision change  Cardiac: No  chest pain  Respiratory:  No  shortness of breath. No  Cough  Gastrointestinal: No  abdominal pain  Musculoskeletal: No new myalgia/arthralgia  Skin: No  Rash, No other wounds/concerning lesions  Endocrine: No polyuria/polydipsia/polyphagia   Neurologic: + hand tremor. No  weakness, No  dizziness, No  slurred speech/focal weakness/facial droop  Psychiatric: No  concerns with depression  Exam:  BP 123/83 (BP Location: Right Arm, Patient Position: Sitting)   Pulse 80   Wt 240 lb (108.9 kg)   SpO2 97%   BMI 42.51 kg/m   Constitutional: VS see above. General Appearance:  alert, well-developed, well-nourished, NAD  Eyes: Normal lids and conjunctive, non-icteric sclera  Neck: No masses, trachea midline.  Respiratory: Normal respiratory effort. no wheeze, no rhonchi, no rales  Cardiovascular: S1/S2 normal, no murmur, no rub/gallop auscultated. RRR.   Gastrointestinal: Nontender, no masses.   Musculoskeletal: Gait normal. No clubbing/cyanosis of digits.   Neurological: Normal balance/coordination. No tremor. No cranial nerve deficit on limited exam.  Skin: warm, dry, intact. No rash/ulcer. No concerning nevi or subq nodules on limited exam.   Psychiatric: Normal judgment/insight. Normal mood and affect. Oriented x3.    No results found for this or any previous  visit (from the past 72 hour(s)).  No results found.   ASSESSMENT/PLAN: The primary encounter diagnosis was Essential tremor. Diagnoses of Flu vaccine need and Type 2 diabetes mellitus with diabetic nephropathy, with long-term current use of insulin (HCC) were also pertinent to this visit.   Essential tremor  FL2 paperwork completed and given to patient today.  Routine Health Maintenance  HbA1c 7.9 today, up form 7.3 in March. Pt is unable to measure BG at home or give injection medications. Will continue to monitor w/ endocrinology, but FL2 should help her get the assistance she needs.  Other chronic conditions are currently stable and managed with specialists (neurology, endocrinology, psychiatry, pain medicine)  Flu shot given today.  Follow up in 6 months for annual, recheck chronic conditions.  Orders Placed This Encounter  Procedures  . Flu Vaccine QUAD 6+ mos PF IM (Fluarix Quad PF)  . POCT HgB A1C    No orders of the defined types were placed in this encounter.   There are no Patient Instructions on file for this visit.      Visit summary with medication list and pertinent instructions was printed for patient to review. All questions at time of visit were answered -  patient instructed to contact office with any additional concerns or updates. ER/RTC precautions were reviewed with the patient.   Please note: voice recognition software was used to produce this document, and typos may escape review. Please contact Dr. Lyn Hollingshead for any needed clarifications.     Follow-up plan: Return in about 6 months (around 05/25/2020) for [place] (call week prior to visit for lab orders).  Rutha Bouchard, [facility] MS3

## 2019-11-25 NOTE — Telephone Encounter (Signed)
PT dropped off paperwork to be filled out.   Fill in HOME, DOMICILARY, SNF Fill out top part

## 2019-12-03 ENCOUNTER — Telehealth: Payer: Self-pay

## 2019-12-03 NOTE — Telephone Encounter (Signed)
Patient was seen on 11/25/2019. Patient's forms were filled out by Dr. Lyn Hollingshead during the visit and given to patient. Patient brought forms back in to office before leaving the building with out instructions. Forms are being put in envelope and placed at front desk. For patient pick up, or faxing instructions.

## 2019-12-09 IMAGING — DX LUMBAR SPINE - COMPLETE 4+ VIEW
5 series · 5 of 5 positions shown · non-contrast
Comparison: None.

CLINICAL DATA: Lower back pain.

EXAM:
LUMBAR SPINE - COMPLETE 4+ VIEW

[l-spine ap]
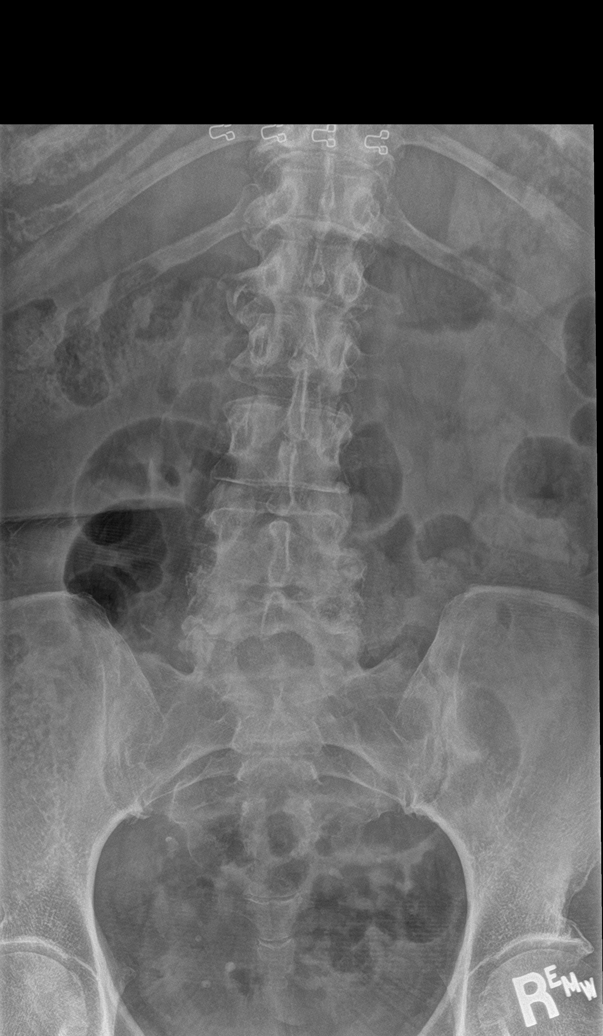

[l-spine obl (1 of 2)]
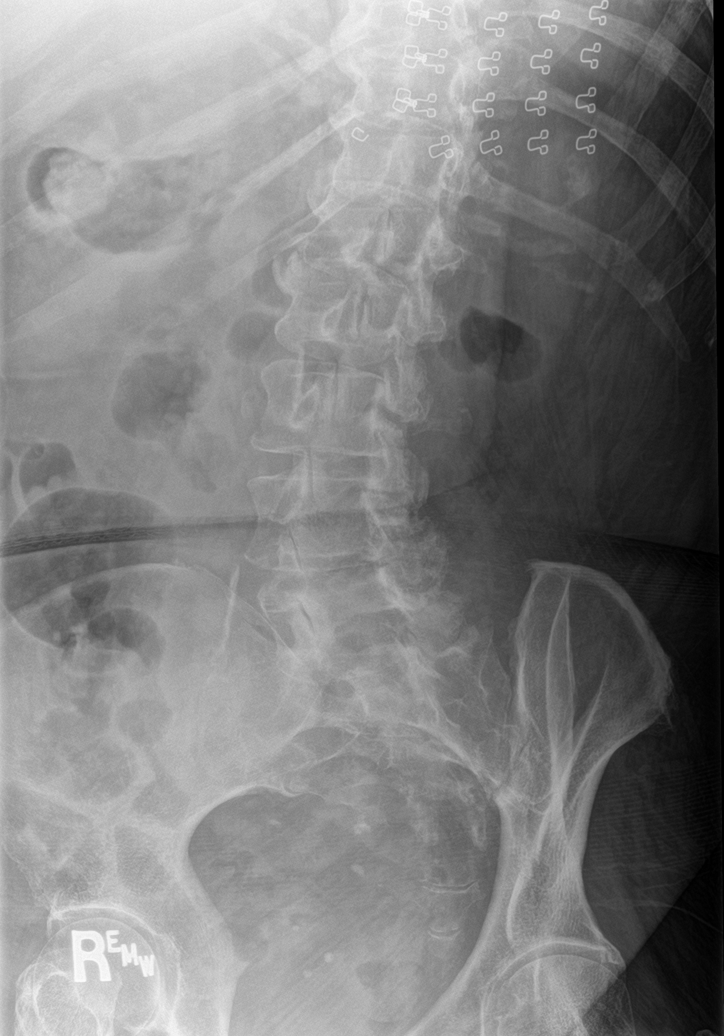

[l-spine obl (2 of 2)]
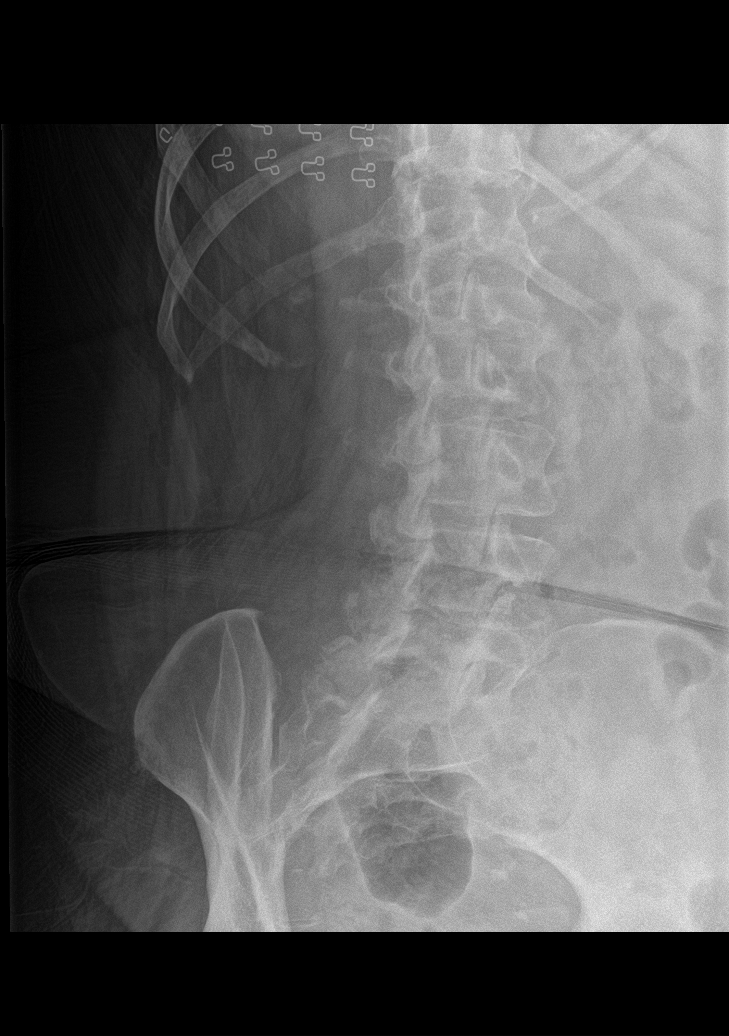

[l-spine lat]
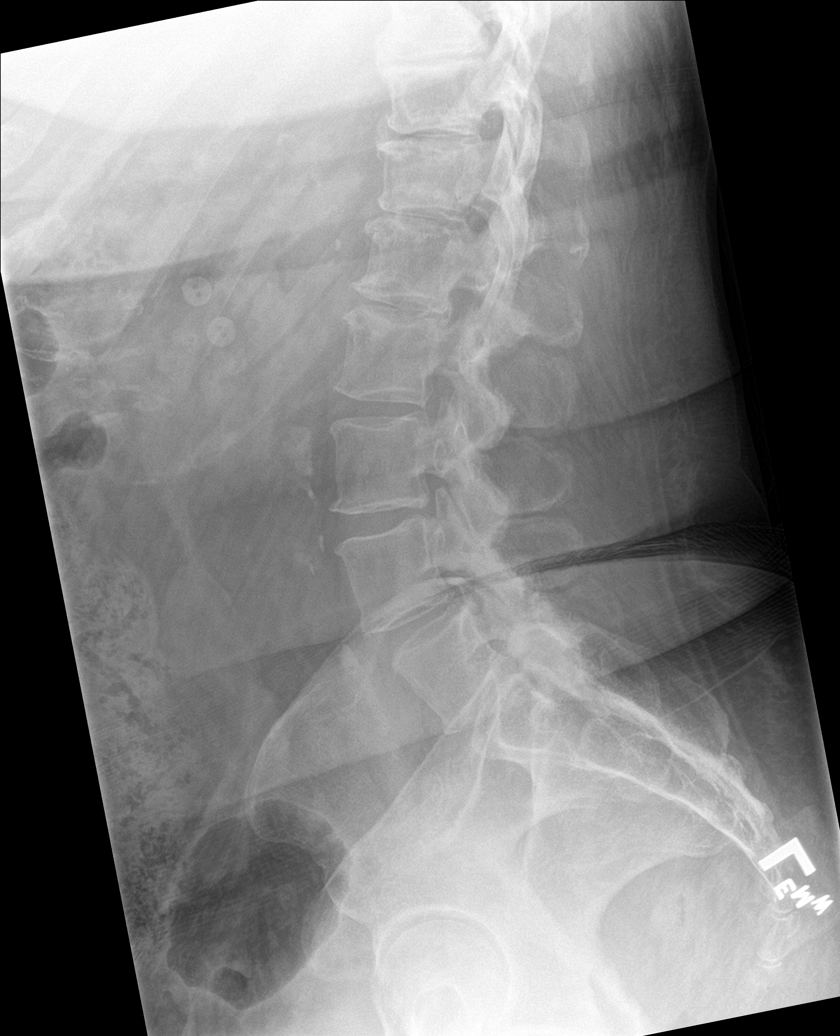

[l-spine spot]
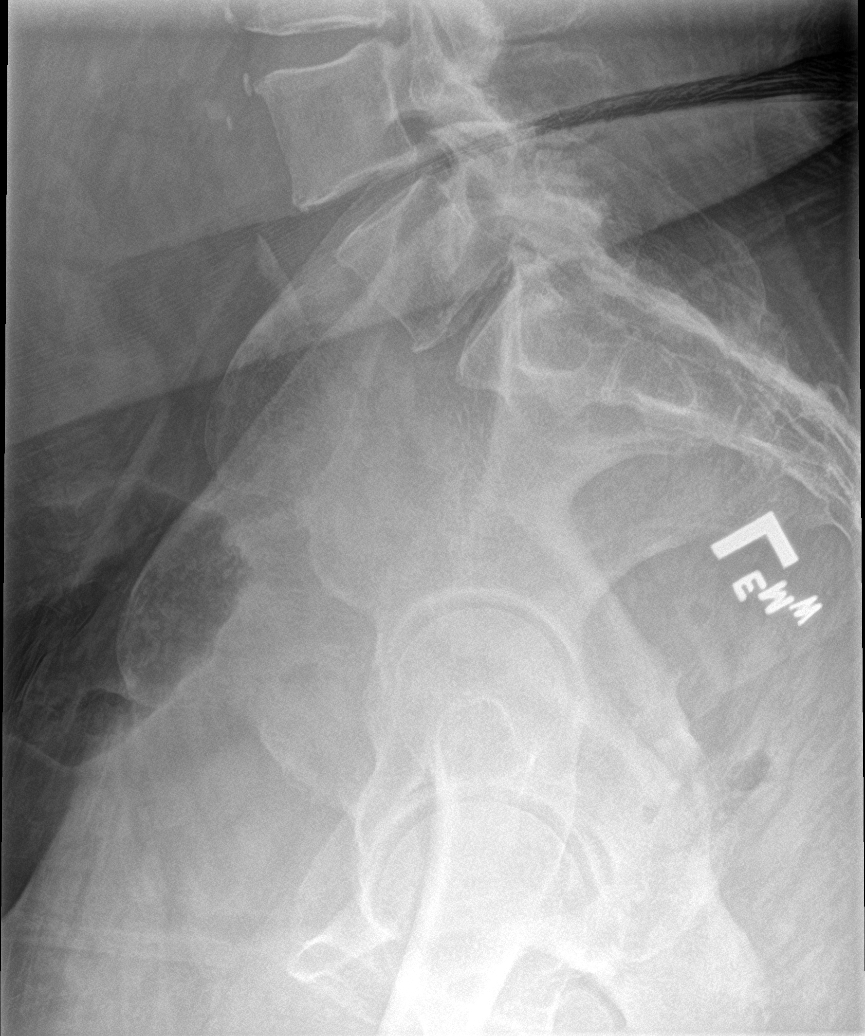

[5 of 5 positions shown; findings below may reference images not displayed]

FINDINGS: Mild rightward curvature of the lumbar spine. Normal anatomic
alignment. No evidence for acute fracture or dislocation. Mild lower
lumbar spine degenerative disc disease. Bulky facet degenerative
changes L4-5 and L5-S1. Lower thoracic spine degenerative changes.
SI joints unremarkable. Pelvic phleboliths.
IMPRESSION: Lower lumbar spine degenerative disc and facet disease.

## 2019-12-10 ENCOUNTER — Encounter: Payer: Self-pay | Admitting: Internal Medicine

## 2019-12-10 ENCOUNTER — Ambulatory Visit (INDEPENDENT_AMBULATORY_CARE_PROVIDER_SITE_OTHER): Payer: BC Managed Care – PPO | Admitting: Internal Medicine

## 2019-12-10 ENCOUNTER — Telehealth: Payer: Self-pay | Admitting: Osteopathic Medicine

## 2019-12-10 ENCOUNTER — Other Ambulatory Visit: Payer: Self-pay

## 2019-12-10 VITALS — BP 138/76 | HR 93 | Temp 97.7°F | Ht 64.0 in | Wt 239.2 lb

## 2019-12-10 DIAGNOSIS — Z6841 Body Mass Index (BMI) 40.0 and over, adult: Secondary | ICD-10-CM | POA: Diagnosis not present

## 2019-12-10 DIAGNOSIS — G4733 Obstructive sleep apnea (adult) (pediatric): Secondary | ICD-10-CM

## 2019-12-10 NOTE — Telephone Encounter (Signed)
Patient dropped off paperwork to be filled out (12/10/2019), attached billing form & left it in provider box, also patient stated "make sure she knows home health, not nursing home health" when she handed the papers over. AM

## 2019-12-10 NOTE — Patient Instructions (Addendum)
Order- referral to DME [facility] to establish  BIPAP 25/22, mask of choice, humidifier, supplies- needs now. Please enroll in AirView/ card. Please refit mask  Please call if we can help

## 2019-12-10 NOTE — Progress Notes (Signed)
09/02/19- 48 yoF former smoker for sleep evaluation Medical problem list includes HTN, DM2, Tremor, Rheumatoid Arthritis, Psoriasis, Osteoarthritis, Morbid Obesity, Situational Anxiety, CKD NPSG 01/22/2019- AHI 11.4/ hr, desaturation to 84%- required supplemental O2 @1L /min, body weight 245 lbs CPAP titration 03/05/19- CPAP inadequate, needed BIPAP 25/22 with min sat 90%. BIPAP- 25/22 ( AirCurve 10), no chip/ [facility] Epworth score- 14 Body weight today- 236 lbs Since sleep study has tried to lose some weight. Watching impact on snoring and daytime tiredness.  No hx ENT surgery, lung or heart disease. Aware of loud snoring.  12/10/19-  64 yoF former smoker followed for OSA, complicated by HTN, DM2, Tremor, Rheumatoid Arthritis, Psoriasis, Osteoarthritis, Morbid Obesity, Situational Anxiety, CKD BIPAP 25/22/ DME??    AirCurve 10, no chip Sent for mask desensitization Body weight today- 239 lbs Download- Covid vax- 2 Phizer Flu vax- had [facility] doesn't have record of her. Need to reinstall. She reports regularly using BIPAP.  ROS-see HPI  + = positive Constitutional:    weight loss, night sweats, fevers, chills, fatigue, lassitude. HEENT:    headaches, difficulty swallowing, tooth/dental problems, sore throat,       sneezing, itching, ear ache, nasal congestion, post nasal drip, snoring CV:    chest pain, orthopnea, PND, swelling in lower extremities, anasarca,                                   dizziness, palpitations Resp:   shortness of breath with exertion or at rest.                productive cough,   non-productive cough, coughing up of blood.              change in color of mucus.  wheezing.   Skin:    rash or lesions. GI:  No-   heartburn, indigestion, abdominal pain, nausea, vomiting, diarrhea,                 change in bowel habits, loss of appetite GU: dysuria, change in color of urine, no urgency or frequency.   flank pain. MS:   joint pain, stiffness, decreased range of motion, back  pain. Neuro-     nothing unusual Psych:  change in mood or affect.  depression or anxiety.   memory loss.  OBJ- Physical Exam General- Alert, Oriented, Affect-appropriate, Distress- none acute, + obese Skin- rash-none, lesions- none, excoriation- none Lymphadenopathy- none Head- atraumatic            Eyes- Gross vision intact, PERRLA, conjunctivae and secretions clear            Ears- Hearing, canals-normal            Nose- Clear, no-Septal dev, mucus, polyps, erosion, perforation             Throat- Mallampati III-IV , mucosa clear , drainage- none, tonsils- atrophic, + teeth Neck- flexible , trachea midline, no stridor , thyroid nl, carotid no bruit Chest - symmetrical excursion , unlabored           Heart/CV- RRR , no murmur , no gallop  , no rub, nl s1 s2                           - JVD- none , edema- none, stasis changes- none, varices- none           Lung-  clear to P&A, wheeze- none, cough- none , dullness-none, rub- none           Chest wall-  Abd-  Br/ Gen/ Rectal- Not done, not indicated Extrem- cyanosis- none, clubbing, none, atrophy- none, strength- nl Neuro- grossly intact to observation

## 2019-12-15 ENCOUNTER — Encounter: Payer: Self-pay | Admitting: Internal Medicine

## 2019-12-15 NOTE — Assessment & Plan Note (Signed)
She feels BIPAP helpful- sleeps better. We need download- pressure settings are high. Plan- Re-enroll with [facility] for BIPAP 25/22 and reinstall AirView

## 2019-12-15 NOTE — Assessment & Plan Note (Signed)
Has not accomplished the weight loss she hoped for last year. Encouraged to work at it.

## 2019-12-17 DIAGNOSIS — G4733 Obstructive sleep apnea (adult) (pediatric): Secondary | ICD-10-CM | POA: Diagnosis not present

## 2019-12-19 ENCOUNTER — Telehealth: Payer: Self-pay | Admitting: Osteopathic Medicine

## 2019-12-19 NOTE — Telephone Encounter (Signed)
Following up on forms that were dropped off here, I need to know what kind of assistance they are applying for, I understand that this is for home health but this is not a typical form that is required by a home health agency, I they requesting some sort of home health aide/24-hour supervision, what exactly are we trying to get here?

## 2019-12-26 NOTE — Telephone Encounter (Signed)
At provider's request, attempted to contact pt regarding her forms. No answer. Left a detailed vm msg for pt to clarify what kind of assistance she is requesting. Direct call back info provided.

## 2019-12-27 ENCOUNTER — Telehealth: Payer: Self-pay | Admitting: Internal Medicine

## 2019-12-27 DIAGNOSIS — G4733 Obstructive sleep apnea (adult) (pediatric): Secondary | ICD-10-CM | POA: Diagnosis not present

## 2019-12-27 NOTE — Telephone Encounter (Signed)
At provider's request, attempted to contact pt regarding her forms. No answer. Left a detailed vm msg for pt to clarify what kind of assistance she is requesting. Direct call back info provided. 

## 2019-12-27 NOTE — Telephone Encounter (Signed)
We ordered CPAP and supplies to [facility] 12/10/19- tried calling the pt and she did not answer and no VM

## 2019-12-30 NOTE — Telephone Encounter (Signed)
Attempted to call pt but unable to reach. Left message for her to return call. Due to multiple attempts trying to reach pt and unable to do so, encounter will be closed per protocol.

## 2020-01-17 DIAGNOSIS — G4733 Obstructive sleep apnea (adult) (pediatric): Secondary | ICD-10-CM | POA: Diagnosis not present

## 2020-01-23 ENCOUNTER — Ambulatory Visit (INDEPENDENT_AMBULATORY_CARE_PROVIDER_SITE_OTHER): Payer: BC Managed Care – PPO | Admitting: Sports Medicine

## 2020-01-23 DIAGNOSIS — M5030 Other cervical disc degeneration, unspecified cervical region: Secondary | ICD-10-CM

## 2020-01-23 DIAGNOSIS — M545 Low back pain, unspecified: Secondary | ICD-10-CM

## 2020-01-23 DIAGNOSIS — M5441 Lumbago with sciatica, right side: Secondary | ICD-10-CM | POA: Diagnosis not present

## 2020-01-23 MED ORDER — DEXAMETHASONE 4 MG PO TABS: 4.0000 mg | ORAL_TABLET | Freq: Three times a day (TID) | ORAL | 0 refills | Status: DC

## 2020-01-23 MED ORDER — CYCLOBENZAPRINE HCL 10 MG PO TABS: ORAL_TABLET | ORAL | 0 refills | Status: DC

## 2020-01-23 NOTE — Assessment & Plan Note (Signed)
This is a pleasant 64 year old female, she was raking her leaves and developed some low back pain, with right-sided radicular pain down the back of the thigh. We treated her about a year and a half ago for the same thing, x-rays at the time showed multilevel DDD, restarting Decadron, Flexeril, she never did therapy last time but I explained the importance and so I do plan to do some PT per Return to see me in 4 to 6 weeks as needed. We will get an MRI for epidural planning if no better.

## 2020-01-23 NOTE — Assessment & Plan Note (Signed)
Dominique Russell also has cervical DDD, chronic neck pain, I would like physical therapy to work on her cervical spine as well, if insufficient improvement over the next 4 to 6 weeks we will proceed with an x-ray and MRI for interventional planning.

## 2020-01-23 NOTE — Progress Notes (Signed)
    Procedures performed today:    None.  Independent interpretation of notes and tests performed by another provider:   None.  Brief History, Exam, Impression, and Recommendations:    Low back pain This is a pleasant 64 year old female, she was raking her leaves and developed some low back pain, with right-sided radicular pain down the back of the thigh. We treated her about a year and a half ago for the same thing, x-rays at the time showed multilevel DDD, restarting Decadron, Flexeril, she never did therapy last time but I explained the importance and so I do plan to do some PT per Return to see me in 4 to 6 weeks as needed. We will get an MRI for epidural planning if no better.  DDD (degenerative disc disease), cervical Dominique Russell also has cervical DDD, chronic neck pain, I would like physical therapy to work on her cervical spine as well, if insufficient improvement over the next 4 to 6 weeks we will proceed with an x-ray and MRI for interventional planning.    ___________________________________________ Ihor Austin. Benjamin Stain, M.D., ABFM., CAQSM. Primary Care and Sports Medicine [facility]  Adjunct Instructor of Family Medicine  [employer]

## 2020-01-25 ENCOUNTER — Other Ambulatory Visit: Payer: Self-pay | Admitting: Osteopathic Medicine

## 2020-01-25 DIAGNOSIS — I10 Essential (primary) hypertension: Secondary | ICD-10-CM

## 2020-01-25 DIAGNOSIS — F339 Major depressive disorder, recurrent, unspecified: Secondary | ICD-10-CM

## 2020-02-04 ENCOUNTER — Ambulatory Visit: Payer: BC Managed Care – PPO | Attending: Sports Medicine | Admitting: Physical Therapy

## 2020-02-04 ENCOUNTER — Other Ambulatory Visit: Payer: Self-pay

## 2020-02-04 ENCOUNTER — Encounter: Payer: Self-pay | Admitting: Physical Therapy

## 2020-02-04 DIAGNOSIS — R262 Difficulty in walking, not elsewhere classified: Secondary | ICD-10-CM | POA: Insufficient documentation

## 2020-02-04 DIAGNOSIS — R293 Abnormal posture: Secondary | ICD-10-CM | POA: Insufficient documentation

## 2020-02-04 DIAGNOSIS — M6281 Muscle weakness (generalized): Secondary | ICD-10-CM | POA: Insufficient documentation

## 2020-02-04 DIAGNOSIS — M5441 Lumbago with sciatica, right side: Secondary | ICD-10-CM | POA: Diagnosis not present

## 2020-02-04 NOTE — Therapy (Signed)
[facility]patient Rehabilitation [facility] [address]  [address] [place], [place], [address] Phone: [phone]   Fax:  [phone]  Physical Therapy Evaluation  Patient Details  Name: Dominique Russell MRN: [id number] Date of Birth: 18-Jun-1955 Referring Provider (PT): Rodney Langton, MD   Encounter Date: 02/04/2020   PT End of Session - 02/04/20 1023    Visit Number 1    Number of Visits 7    Date for PT Re-Evaluation 03/17/20    Authorization Type UHC Medicare, Medicaid, BCBS    PT Start Time [phone]    PT Stop Time 1018    PT Time Calculation (min) 47 min    Activity Tolerance Patient limited by pain    Behavior During Therapy [facility] for tasks assessed/performed           Past Medical History:  Diagnosis Date  . Diabetes (HCC)   . Essential tremor   . Fibromyalgia   . Hypertension     Past Surgical History:  Procedure Laterality Date  . BREAST REDUCTION SURGERY      There were no vitals filed for this visit.    Subjective Assessment - 02/04/20 0933    Subjective Patient reports that she has had LBP "forever" but re-injured herself when trying to rake the yard a week or 2 ago. Pain occurs in the middle of the LB with radiation of pain and N/T down the R posterior LE to the ankle. Denies B&B changes. Worse with lifting, bending forward, walking. Better with heating pad, Voltaren.    Pertinent History HTN, fibromyalgia, essential tremor, DM    Limitations Walking;Standing;House hold activities;Sitting;Lifting    How long can you sit comfortably? 25-30 min    How long can you stand comfortably? 5 minutes    How long can you walk comfortably? 5 minutes    Diagnostic tests 07/27/18 lumbar xray: Lower lumbar spine degenerative disc and facet disease.    Patient Stated Goals decrease pain    Currently in Pain? Yes    Pain Score 5     Pain Location Back    Pain Orientation Lower    Pain Descriptors / Indicators Dull    Pain Type Acute  pain;Chronic pain              [facility] PT Assessment - 02/04/20 0939      Assessment   Medical Diagnosis Acute L LBP without sciatica, acute R LBP with R sciatica    Referring Provider (PT) Rodney Langton, MD    Onset Date/Surgical Date 01/21/20    Hand Dominance Right    Next MD Visit 02/25/19    Prior Therapy yes- not for LB      Precautions   Precautions None      Balance Screen   Has the patient fallen in the past 6 months No    Has the patient had a decrease in activity level because of a fear of falling?  No    Is the patient reluctant to leave their home because of a fear of falling?  No      Home Nurse, mental health Private residence    Living Arrangements Children   daughter and granddaughter   Available Help at Discharge Family    Type of Home House    Home Access Stairs to enter    Entrance Stairs-Number of Steps 5    Entrance Stairs-Rails None    Home Layout Two level  Alternate Level Stairs-Number of Steps 12    Alternate Level Stairs-Rails Right    Home Equipment None      Prior Function   Level of Independence Needs assistance with homemaking   daughter assists with cooking and medications   Vocation Retired    Leisure Nurse, learning disability   Overall Cognitive Status Within Functional Limits for tasks assessed      Observation/Other Assessments   Focus on Therapeutic Outcomes (FOTO)  Lumbar: 43 (57% limited, 40% predicted)      Sensation   Light Touch Appears Intact      Coordination   Gross Motor Movements are Fluid and Coordinated Yes      Posture/Postural Control   Posture/Postural Control Postural limitations    Postural Limitations Rounded Shoulders    Posture Comments B shoulders elevated/guarded; excessive cervical lordosis and mild dowager's hump      ROM / Strength   AROM / PROM / Strength AROM;Strength      AROM   AROM Assessment Site Lumbar    Lumbar Flexion nearly to toes   severe pain   Lumbar Extension  moderately limited   moderate pain   Lumbar - Right Side Bend distal thigh   "straining"   Lumbar - Left Side Bend distal thigh   "straining"   Lumbar - Right Rotation WFL   severe pain   Lumbar - Left Rotation WFL   severe pain     Strength   Overall Strength Comments pain in LB with testing    Strength Assessment Site Hip;Knee;Ankle    Right/Left Hip Right;Left    Right Hip Flexion 4-/5    Right Hip ABduction 4/5    Right Hip ADduction 4-/5    Left Hip Flexion 4+/5    Left Hip ABduction 4+/5    Left Hip ADduction 4/5    Right/Left Knee Right;Left    Right Knee Flexion 4-/5    Right Knee Extension 4+/5    Left Knee Flexion 4-/5    Left Knee Extension 4/5    Right/Left Ankle Right;Left    Right Ankle Dorsiflexion 3+/5    Right Ankle Plantar Flexion 3+/5    Left Ankle Dorsiflexion 3+/5    Left Ankle Plantar Flexion 3+/5      Flexibility   Soft Tissue Assessment /Muscle Length yes    Piriformis B severely tight in figure 4      Palpation   Palpation comment TTP along midline of spine from T10-S1 as well as the paraspinals parallel to this region; soft tissue restriction in B piriformis, TTP on R      Ambulation/Gait   Assistive device None    Gait Pattern Step-through pattern   guarded gait   Ambulation Surface Level;Indoor    Gait velocity slightly decreased                      Objective measurements completed on examination: See above findings.       [facility] Adult PT Treatment/Exercise - 02/04/20 0939      Modalities   Modalities Moist Heat      Moist Heat Therapy   Number Minutes Moist Heat 10 Minutes    Moist Heat Location Lumbar Spine                  PT Education - 02/04/20 1023    Education Details prognosis, POC, HEP- advised not to push into pain    Person(s)  Educated Patient    Methods Explanation;Demonstration;Tactile cues;Verbal cues;Handout    Comprehension Returned demonstration;Verbalized understanding            PT  Short Term Goals - 02/04/20 1114      PT SHORT TERM GOAL #1   Title Patient to be independent with initial HEP.    Time 2    Period Weeks    Status New    Target Date 02/18/20             PT Long Term Goals - 02/04/20 1114      PT LONG TERM GOAL #1   Title Patient to be independent with advanced HEP.    Time 6    Period Weeks    Status New    Target Date 03/17/20      PT LONG TERM GOAL #2   Title Patient to demonstrate lumbar AROM WFL and without pain limiting.    Time 6    Period Weeks    Status New    Target Date 03/17/20      PT LONG TERM GOAL #3   Title Patient to demonstrate B LE strength >/=4+/5.    Time 6    Period Weeks    Status New    Target Date 03/17/20      PT LONG TERM GOAL #4   Title Patient to demonstrate good body mechanics when lifting a 10lbs box from floor and without pain.    Time 6    Period Weeks    Status New    Target Date 03/17/20      PT LONG TERM GOAL #5   Title Patient to report tolerance for 30 minutes on her feet without pain limiting.    Time 6    Period Weeks    Status New    Target Date 03/17/20                  Plan - 02/04/20 1024    Clinical Impression Statement Patient is a 64 y/o F presenting to OPPT with c/o acute on chronic midline LBP for the past 1-2 weeks after raking leaves. Pain occurs along the midline of the LB with radiation of pain and N/T down the R posterior LE to the ankle. Denies B&B changes. Worse with lifting, bending forward, walking. Patient today presenting with rounded and guarded posture, limited and painful lumbar AROM, B hip and ankle weakness, TTP and soft tissue restriction in B posterior chain, TTP along midline of lower thoracic and lumbar spine, and gait deviations. Patient was educated on gentle stretching and strengthening HEP and advised not to push into pain- patient reported understanding. Ended session with moist heat to LB for pain relief- patient reported good improvement in pain  after modality. Would benefit from skilled PT services 1x/week for 6 weeks to address aforementioned impairments.    Personal Factors and Comorbidities Age;Behavior Pattern;Comorbidity 3+;Fitness;Past/Current Experience;Time since onset of injury/illness/exacerbation    Comorbidities HTN, fibromyalgia, essentrial tremor, DM    Examination-Activity Limitations Sit;Sleep;Bed Mobility;Bend;Squat;Stairs;Carry;Stand;Dressing;Transfers;Hygiene/Grooming;Lift;Locomotion Level;Reach Overhead    Examination-Participation Restrictions Church;Cleaning;Community Activity;Shop;Yard Work;Laundry;Meal Prep    Stability/Clinical Decision Making Evolving/Moderate complexity    Clinical Decision Making Moderate    Rehab Potential Good    PT Frequency 1x / week    PT Duration 6 weeks    PT Treatment/Interventions ADLs/Self Care Home Management;Cryotherapy;Electrical Stimulation;Moist Heat;Traction;Balance training;Therapeutic exercise;Therapeutic activities;Functional mobility training;Stair training;Gait training;Ultrasound;Neuromuscular re-education;Patient/family education;Manual techniques;Taping;Energy conservation;Dry needling;Passive range of motion    PT Next  Visit Plan reassess HEP; progress lumbopelvic ROM and LE strengthing to tolerance; modalities as needed    Consulted and Agree with Plan of Care Patient           Patient will benefit from skilled therapeutic intervention in order to improve the following deficits and impairments:  Hypomobility,Pain,Increased fascial restricitons,Decreased strength,Decreased activity tolerance,Decreased balance,Difficulty walking,Improper body mechanics,Decreased range of motion,Postural dysfunction,Impaired flexibility  Visit Diagnosis: Acute midline low back pain with right-sided sciatica  Muscle weakness (generalized)  Abnormal posture  Difficulty in walking, not elsewhere classified     Problem List Patient Active Problem List   Diagnosis Date Noted   . DDD (degenerative disc disease), cervical 01/23/2020  . Otitis media, left 09/04/2019  . OSA (obstructive sleep apnea) 01/22/2019  . Bone disorder 09/06/2018  . Essential hypertension 03/17/2016  . Psoriasis 03/17/2016  . Long-term insulin use in type 2 diabetes (HCC) 02/09/2015  . Morbid obesity with BMI of 45.0-49.9, adult (HCC) 05/16/2014  . DM (diabetes mellitus) (HCC) 01/23/2013  . Low back pain 01/23/2013  . Shoulder pain 01/23/2013  . Bilateral knee pain 12/26/2012  . Bilateral shoulder pain 12/26/2012  . Essential tremor 09/26/2012  . Headache 09/26/2012  . Benign essential tremor 09/26/2012  . Fibromyalgia 03/02/2011  . Generalized osteoarthritis of multiple sites 03/02/2011  . Rheumatoid arthritis (HCC) 03/02/2011    Anette Guarneri, PT, DPT 02/04/20 11:20 AM   [facility] [address]  [address] [place], [place], [address] Phone: [phone]   Fax:  [phone]  Name: Dominique Russell MRN: [id number] Date of Birth: [birth year]

## 2020-02-10 ENCOUNTER — Telehealth: Payer: Self-pay | Admitting: General Practice

## 2020-02-12 ENCOUNTER — Other Ambulatory Visit: Payer: Self-pay | Admitting: Osteopathic Medicine

## 2020-02-12 ENCOUNTER — Other Ambulatory Visit: Payer: Self-pay

## 2020-02-12 DIAGNOSIS — G250 Essential tremor: Secondary | ICD-10-CM | POA: Diagnosis not present

## 2020-02-12 DIAGNOSIS — S0282XA Fracture of other specified skull and facial bones, left side, initial encounter for closed fracture: Secondary | ICD-10-CM | POA: Diagnosis not present

## 2020-02-12 DIAGNOSIS — I10 Essential (primary) hypertension: Secondary | ICD-10-CM

## 2020-02-12 DIAGNOSIS — F339 Major depressive disorder, recurrent, unspecified: Secondary | ICD-10-CM

## 2020-02-12 MED ORDER — HYDROCHLOROTHIAZIDE 25 MG PO TABS: 25.0000 mg | ORAL_TABLET | Freq: Every day | ORAL | 1 refills | Status: DC

## 2020-02-13 ENCOUNTER — Encounter: Payer: Self-pay | Admitting: Physical Therapy

## 2020-02-13 ENCOUNTER — Other Ambulatory Visit: Payer: Self-pay

## 2020-02-13 ENCOUNTER — Ambulatory Visit: Payer: BC Managed Care – PPO | Admitting: Physical Therapy

## 2020-02-13 DIAGNOSIS — R262 Difficulty in walking, not elsewhere classified: Secondary | ICD-10-CM | POA: Diagnosis not present

## 2020-02-13 DIAGNOSIS — R293 Abnormal posture: Secondary | ICD-10-CM | POA: Diagnosis not present

## 2020-02-13 DIAGNOSIS — M6281 Muscle weakness (generalized): Secondary | ICD-10-CM | POA: Diagnosis not present

## 2020-02-13 DIAGNOSIS — M5441 Lumbago with sciatica, right side: Secondary | ICD-10-CM | POA: Diagnosis not present

## 2020-02-13 NOTE — Therapy (Signed)
[facility] Outpatient Rehabilitation [facility] [address]  [address] [place], [place], [address] Phone: [phone]   Fax:  [phone]  Physical Therapy Treatment  Patient Details  Name: Dominique Russell MRN: [id number] Date of Birth: Nov 07, 1955 Referring Provider (PT): Rodney Langton, MD   Encounter Date: 02/13/2020   PT End of Session - 02/13/20 1446    Visit Number 2    Number of Visits 7    Date for PT Re-Evaluation 03/17/20    Authorization Type UHC Medicare, Medicaid, BCBS    PT Start Time 1405    PT Stop Time 1445    PT Time Calculation (min) 40 min    Activity Tolerance Patient limited by pain;Patient tolerated treatment well    Behavior During Therapy [facility] for tasks assessed/performed           Past Medical History:  Diagnosis Date  . Diabetes (HCC)   . Essential tremor   . Fibromyalgia   . Hypertension     Past Surgical History:  Procedure Laterality Date  . BREAST REDUCTION SURGERY      There were no vitals filed for this visit.   Subjective Assessment - 02/13/20 1407    Subjective "Been doing good. I'm feeling better." The only thing she still has issues with is getting out of bed in the AM. Denies questions on HEP.    Pertinent History HTN, fibromyalgia, essential tremor, DM    Diagnostic tests 07/27/18 lumbar xray: Lower lumbar spine degenerative disc and facet disease.    Patient Stated Goals decrease pain    Currently in Pain? No/denies                             [facility] Adult PT Treatment/Exercise - 02/13/20 0001      Exercises   Exercises Knee/Hip      Knee/Hip Exercises: Stretches   Piriformis Stretch Right;Left;1 rep;30 seconds    Piriformis Stretch Limitations modified ER stretch with pillow under knee    Other Knee/Hip Stretches anterior prayer stretch with green pball 10x3", R/L diagonals 5x3" each    Other Knee/Hip Stretches sitting lumbar flexion to reach ankles 10x to tolerance   relief  of LBP     Knee/Hip Exercises: Aerobic   Nustep L3 x 5 min (UEs/LEs)   patient fatigued     Knee/Hip Exercises: Standing   Heel Raises Both;1 set;15 reps    Heel Raises Limitations at TM rail   limited ROM; c/o pain in B posterior thighs   Hip Flexion Stengthening;Both;1 set;10 reps;Knee bent    Hip Flexion Limitations alt toe tap on TM 2#    Functional Squat 1 set;10 reps    Functional Squat Limitations mini squat at TM rail   c/o pain in B posterior LEs; cues for proper form     Knee/Hip Exercises: Seated   Other Seated Knee/Hip Exercises pelvic tilts 10x   manual cues to encourage proper movement pattern and avoid muscel guarding     Knee/Hip Exercises: Supine   Bridges Strengthening;1 set;10 reps    Bridges Limitations cues for rhythmic breathing pattern   good ROM                 PT Education - 02/13/20 1446    Education Details update to HEP; edu on using log roll for supine>sit transfers    Person(s) Educated Patient    Methods Explanation;Demonstration;Tactile cues;Verbal cues;Handout  Comprehension Returned demonstration;Verbalized understanding            PT Short Term Goals - 02/13/20 1448      PT SHORT TERM GOAL #1   Title Patient to be independent with initial HEP.    Time 2    Period Weeks    Status Achieved    Target Date 02/18/20             PT Long Term Goals - 02/13/20 1448      PT LONG TERM GOAL #1   Title Patient to be independent with advanced HEP.    Time 6    Period Weeks    Status On-going      PT LONG TERM GOAL #2   Title Patient to demonstrate lumbar AROM WFL and without pain limiting.    Time 6    Period Weeks    Status On-going      PT LONG TERM GOAL #3   Title Patient to demonstrate B LE strength >/=4+/5.    Time 6    Period Weeks    Status On-going      PT LONG TERM GOAL #4   Title Patient to demonstrate good body mechanics when lifting a 10lbs box from floor and without pain.    Time 6    Period Weeks     Status On-going      PT LONG TERM GOAL #5   Title Patient to report tolerance for 30 minutes on her feet without pain limiting.    Time 6    Period Weeks    Status On-going                 Plan - 02/13/20 1447    Clinical Impression Statement Patient reporting improvements in pain since initial eval. Still notes some difficulty getting out of bed in the AM. Denies questions on HEP. Patient required manual cues to encourage anterior pelvic tilts d/t patient having trouble finding the right movement pattern. Tolerated hip ER stretch well, with most tightness remaining on R vs. L. Initiated standing LE strengthening ther-ex with patient with c/o pain in B posterior thighs and knees. Pain seemed more d/t poor standing tolerance, which was relieved with sitting and lumbar flexion stretching and was not brought on by standing hip flexion. Updated HEP with lumbar flexion stretching- patient reported understanding. No complaints at end of session. Patient is progressing well towards goals.    Comorbidities HTN, fibromyalgia, essentrial tremor, DM    PT Treatment/Interventions ADLs/Self Care Home Management;Cryotherapy;Electrical Stimulation;Moist Heat;Traction;Balance training;Therapeutic exercise;Therapeutic activities;Functional mobility training;Stair training;Gait training;Ultrasound;Neuromuscular re-education;Patient/family education;Manual techniques;Taping;Energy conservation;Dry needling;Passive range of motion    PT Next Visit Plan progress lumbopelvic ROM and LE strengthing to tolerance; modalities as needed    Consulted and Agree with Plan of Care Patient           Patient will benefit from skilled therapeutic intervention in order to improve the following deficits and impairments:  Hypomobility,Pain,Increased fascial restricitons,Decreased strength,Decreased activity tolerance,Decreased balance,Difficulty walking,Improper body mechanics,Decreased range of motion,Postural  dysfunction,Impaired flexibility  Visit Diagnosis: Acute midline low back pain with right-sided sciatica  Muscle weakness (generalized)  Abnormal posture  Difficulty in walking, not elsewhere classified     Problem List Patient Active Problem List   Diagnosis Date Noted  . DDD (degenerative disc disease), cervical 01/23/2020  . Otitis media, left 09/04/2019  . OSA (obstructive sleep apnea) 01/22/2019  . Bone disorder 09/06/2018  . Essential hypertension 03/17/2016  . Psoriasis 03/17/2016  .  Long-term insulin use in type 2 diabetes (HCC) 02/09/2015  . Morbid obesity with BMI of 45.0-49.9, adult (HCC) 05/16/2014  . DM (diabetes mellitus) (HCC) 01/23/2013  . Low back pain 01/23/2013  . Shoulder pain 01/23/2013  . Bilateral knee pain 12/26/2012  . Bilateral shoulder pain 12/26/2012  . Essential tremor 09/26/2012  . Headache 09/26/2012  . Benign essential tremor 09/26/2012  . Fibromyalgia 03/02/2011  . Generalized osteoarthritis of multiple sites 03/02/2011  . Rheumatoid arthritis (HCC) 03/02/2011     Anette Guarneri, PT, DPT 02/13/20 2:49 PM   [facility] [address]  [address] [place], [place], [address] Phone: [phone]   Fax:  [phone]  Name: Dominique Russell MRN: [id number] Date of Birth: 12-21-1955

## 2020-02-18 ENCOUNTER — Ambulatory Visit: Payer: BC Managed Care – PPO

## 2020-02-25 ENCOUNTER — Ambulatory Visit: Payer: Medicare Other | Admitting: Sports Medicine

## 2020-02-25 DIAGNOSIS — M25561 Pain in right knee: Secondary | ICD-10-CM | POA: Diagnosis not present

## 2020-02-25 DIAGNOSIS — M797 Fibromyalgia: Secondary | ICD-10-CM | POA: Diagnosis not present

## 2020-02-25 DIAGNOSIS — G894 Chronic pain syndrome: Secondary | ICD-10-CM | POA: Diagnosis not present

## 2020-02-25 DIAGNOSIS — M25562 Pain in left knee: Secondary | ICD-10-CM | POA: Diagnosis not present

## 2020-02-25 DIAGNOSIS — M549 Dorsalgia, unspecified: Secondary | ICD-10-CM | POA: Diagnosis not present

## 2020-02-25 DIAGNOSIS — G8929 Other chronic pain: Secondary | ICD-10-CM | POA: Diagnosis not present

## 2020-02-26 ENCOUNTER — Other Ambulatory Visit: Payer: Self-pay

## 2020-02-26 ENCOUNTER — Ambulatory Visit: Payer: BC Managed Care – PPO | Attending: Sports Medicine

## 2020-02-26 DIAGNOSIS — R262 Difficulty in walking, not elsewhere classified: Secondary | ICD-10-CM | POA: Diagnosis present

## 2020-02-26 DIAGNOSIS — M6281 Muscle weakness (generalized): Secondary | ICD-10-CM | POA: Diagnosis present

## 2020-02-26 DIAGNOSIS — R293 Abnormal posture: Secondary | ICD-10-CM | POA: Insufficient documentation

## 2020-02-26 DIAGNOSIS — M5441 Lumbago with sciatica, right side: Secondary | ICD-10-CM | POA: Insufficient documentation

## 2020-02-26 NOTE — Telephone Encounter (Signed)
Just documentation

## 2020-02-26 NOTE — Therapy (Addendum)
[facility] [address]  [place], [place], [address] Phone: [phone]   Fax:  [phone]  Physical Therapy Treatment  Patient Details  Name: Dominique Russell MRN: [id number] Date of Birth: 1955-10-19 Referring Provider (PT): Aundria Mems, MD   Progress Note Reporting Period 02/04/20 to 02/26/20  See note below for Objective Data and Assessment of Progress/Goals.     Encounter Date: 02/26/2020   PT End of Session - 02/26/20 1425    Visit Number 3    Number of Visits 7    Date for PT Re-Evaluation 03/17/20    Authorization Type UHC Medicare, Medicaid, BCBS    PT Start Time 4239    PT Stop Time 1439    PT Time Calculation (min) 42 min    Activity Tolerance Patient limited by pain;Patient tolerated treatment well    Behavior During Therapy [facility] for tasks assessed/performed           Past Medical History:  Diagnosis Date  . Diabetes ([place])   . Essential tremor   . Fibromyalgia   . Hypertension     Past Surgical History:  Procedure Laterality Date  . BREAST REDUCTION SURGERY      There were no vitals filed for this visit.   Subjective Assessment - 02/26/20 1405    Subjective Pt. noting new exercise were going well at home.    Pertinent History HTN, fibromyalgia, essential tremor, DM    Diagnostic tests 07/27/18 lumbar xray: Lower lumbar spine degenerative disc and facet disease.    Patient Stated Goals decrease pain    Currently in Pain? No/denies    Pain Score 0-No pain    Pain Location Back    Multiple Pain Sites No                             [facility] Adult PT Treatment/Exercise - 02/26/20 0001      Lumbar Exercises: Stretches   Lower Trunk Rotation Limitations 5" x 10    Pelvic Tilt 10 reps;5 seconds    Pelvic Tilt Limitations cues for sitting position and tilt    Lumbar Stabilization Level 1 2 reps;20 seconds    Lumbar Stabilization Level 1 Limitations B seated  lumbar stretch reaching UE out over chair 2 x 20 sec    Piriformis Stretch Right;Left;1 rep;30 seconds   cues required for forward trunk lean to feel strethc   Piriformis Stretch Limitations modified ER stretch with pillow under knee      Knee/Hip Exercises: Stretches   Gastroc Stretch Right;Left;1 rep;30 seconds    Gastroc Stretch Limitations runners stretch leaning into wall      Knee/Hip Exercises: Aerobic   Nustep L3 x 6 min (UEs/LEs)      Knee/Hip Exercises: Standing   Heel Raises Both;1 set;15 reps    Heel Raises Limitations at TM rail    Hip Flexion Right;Left;10 reps;Knee bent    Hip Flexion Limitations cues for increased height of knee      Knee/Hip Exercises: Supine   Bridges Strengthening;1 set;10 reps    Bridges Limitations cues for rhythmic breathing pattern                    PT Short Term Goals - 02/13/20 1448      PT SHORT TERM GOAL #1   Title Patient to be independent with initial HEP.    Time 2  Period Weeks    Status Achieved    Target Date 02/18/20             PT Long Term Goals - 02/13/20 1448      PT LONG TERM GOAL #1   Title Patient to be independent with advanced HEP.    Time 6    Period Weeks    Status On-going      PT LONG TERM GOAL #2   Title Patient to demonstrate lumbar AROM WFL and without pain limiting.    Time 6    Period Weeks    Status On-going      PT LONG TERM GOAL #3   Title Patient to demonstrate B LE strength >/=4+/5.    Time 6    Period Weeks    Status On-going      PT LONG TERM GOAL #4   Title Patient to demonstrate good body mechanics when lifting a 10lbs box from floor and without pain.    Time 6    Period Weeks    Status On-going      PT LONG TERM GOAL #5   Title Patient to report tolerance for 30 minutes on her feet without pain limiting.    Time 6    Period Weeks    Status On-going                 Plan - 02/26/20 1432    Clinical Impression Statement Mirca denies pain in session  however notes back pain bothered her earlier today.  Progressed lumbopelvic ROM and strengthening activities which were tolerated well.  Did have to review seated pelvic tilt and piriformis stretch seated on mat table for clarification.  Pt. ended session without increased pain thus deferred modalities.    Comorbidities HTN, fibromyalgia, essentrial tremor, DM    Rehab Potential Good    PT Frequency 1x / week    PT Duration 6 weeks    PT Treatment/Interventions ADLs/Self Care Home Management;Cryotherapy;Electrical Stimulation;Moist Heat;Traction;Balance training;Therapeutic exercise;Therapeutic activities;Functional mobility training;Stair training;Gait training;Ultrasound;Neuromuscular re-education;Patient/family education;Manual techniques;Taping;Energy conservation;Dry needling;Passive range of motion    PT Next Visit Plan progress lumbopelvic ROM and LE strengthing to tolerance; modalities as needed    Consulted and Agree with Plan of Care Patient           Patient will benefit from skilled therapeutic intervention in order to improve the following deficits and impairments:  Hypomobility,Pain,Increased fascial restricitons,Decreased strength,Decreased activity tolerance,Decreased balance,Difficulty walking,Improper body mechanics,Decreased range of motion,Postural dysfunction,Impaired flexibility  Visit Diagnosis: Acute midline low back pain with right-sided sciatica  Muscle weakness (generalized)  Abnormal posture  Difficulty in walking, not elsewhere classified     Problem List Patient Active Problem List   Diagnosis Date Noted  . DDD (degenerative disc disease), cervical 01/23/2020  . Otitis media, left 09/04/2019  . OSA (obstructive sleep apnea) 01/22/2019  . Bone disorder 09/06/2018  . Essential hypertension 03/17/2016  . Psoriasis 03/17/2016  . Long-term insulin use in type 2 diabetes ([place]) 02/09/2015  . Morbid obesity with BMI of 45.0-49.9, adult ([place]) 05/16/2014  .  DM (diabetes mellitus) ([place]) 01/23/2013  . Low back pain 01/23/2013  . Shoulder pain 01/23/2013  . Bilateral knee pain 12/26/2012  . Bilateral shoulder pain 12/26/2012  . Essential tremor 09/26/2012  . Headache 09/26/2012  . Benign essential tremor 09/26/2012  . Fibromyalgia 03/02/2011  . Generalized osteoarthritis of multiple sites 03/02/2011  . Rheumatoid arthritis ([place]) 03/02/2011    Bess Harvest, PTA 02/26/20 4:36 PM  [facility] [address]  [place], [place], [address] Phone: [phone]   Fax:  [phone]  Name: RHESA FORSBERG MRN: [id number] Date of Birth: December 22, 1955   PHYSICAL THERAPY DISCHARGE SUMMARY  Visits from Start of Care: 3  Current functional level related to goals / functional outcomes: Unable to assess; patient did not return   Remaining deficits: Unable to assess   Education / Equipment: HEP  Plan: Patient agrees to discharge.  Patient goals were not met. Patient is being discharged due to not returning since the last visit.  ?????     Janene Harvey, PT, DPT 04/01/20 1:23 PM

## 2020-03-06 ENCOUNTER — Other Ambulatory Visit: Payer: Medicare Other

## 2020-03-06 DIAGNOSIS — Z20822 Contact with and (suspected) exposure to covid-19: Secondary | ICD-10-CM

## 2020-03-10 LAB — NOVEL CORONAVIRUS, NAA: SARS-CoV-2, NAA: DETECTED — AB

## 2020-03-10 NOTE — Progress Notes (Deleted)
HPI F former smoker followed for OSA, complicated by HTN, DM2, Tremor, Rheumatoid Arthritis, Psoriasis, Osteoarthritis, Morbid Obesity, Situational Anxiety, CKD NPSG 01/22/2019- AHI 11.4/ hr, desaturation to 84%- required supplemental O2 @1L /min, body weight 245 lbs CPAP titration 03/05/19- CPAP inadequate, needed BIPAP 25/22 with min sat 90%. ========================================================  12/10/19-  64 yoF former smoker followed for OSA, complicated by HTN, DM2, Tremor, Rheumatoid Arthritis, Psoriasis, Osteoarthritis, Morbid Obesity, Situational Anxiety, CKD BIPAP 25/22/ DME??    AirCurve 10, no chip Sent for mask desensitization Body weight today- 239 lbs Download- Covid vax- 2 Phizer Flu vax- had [employer] doesn't have record of her. Need to reinstall. She reports regularly using BIPAP.  03/11/20- 64 yoF former smoker followed for OSA, complicated by HTN, DM2, Tremor, Rheumatoid Arthritis, Psoriasis, Osteoarthritis, Morbid Obesity, Situational Anxiety, CKD BIPAP 25/22/ DME [facility]   AirCurve 10,  Download- Body weight today- Covid vax- Flu vax-  ROS-see HPI  + = positive Constitutional:    weight loss, night sweats, fevers, chills, fatigue, lassitude. HEENT:    headaches, difficulty swallowing, tooth/dental problems, sore throat,       sneezing, itching, ear ache, nasal congestion, post nasal drip, snoring CV:    chest pain, orthopnea, PND, swelling in lower extremities, anasarca,                                   dizziness, palpitations Resp:   shortness of breath with exertion or at rest.                productive cough,   non-productive cough, coughing up of blood.              change in color of mucus.  wheezing.   Skin:    rash or lesions. GI:  No-   heartburn, indigestion, abdominal pain, nausea, vomiting, diarrhea,                 change in bowel habits, loss of appetite GU: dysuria, change in color of urine, no urgency or frequency.   flank pain. MS:   joint pain,  stiffness, decreased range of motion, back pain. Neuro-     nothing unusual Psych:  change in mood or affect.  depression or anxiety.   memory loss.  OBJ- Physical Exam General- Alert, Oriented, Affect-appropriate, Distress- none acute, + obese Skin- rash-none, lesions- none, excoriation- none Lymphadenopathy- none Head- atraumatic            Eyes- Gross vision intact, PERRLA, conjunctivae and secretions clear            Ears- Hearing, canals-normal            Nose- Clear, no-Septal dev, mucus, polyps, erosion, perforation             Throat- Mallampati III-IV , mucosa clear , drainage- none, tonsils- atrophic, + teeth Neck- flexible , trachea midline, no stridor , thyroid nl, carotid no bruit Chest - symmetrical excursion , unlabored           Heart/CV- RRR , no murmur , no gallop  , no rub, nl s1 s2                           - JVD- none , edema- none, stasis changes- none, varices- none           Lung- clear to P&A, wheeze- none, cough- none ,  dullness-none, rub- none           Chest wall-  Abd-  Br/ Gen/ Rectal- Not done, not indicated Extrem- cyanosis- none, clubbing, none, atrophy- none, strength- nl Neuro- grossly intact to observation

## 2020-03-11 ENCOUNTER — Telehealth: Payer: Self-pay

## 2020-03-11 NOTE — Telephone Encounter (Signed)
Called to discuss with patient about COVID-19 symptoms and the use of one of the available treatments for those with mild to moderate Covid symptoms and at a high risk of hospitalization.  Pt appears to qualify for outpatient treatment due to co-morbid conditions and/or a member of an at-risk group in accordance with the FDA Emergency Use Authorization.    Symptom onset: Unknown Vaccinated: Yes Booster? Yes Immunocompromised? No Qualifiers: HTN, DM  Unable to reach pt - Left messages on both numbers with call back number [phone].   Esther Hardy

## 2020-03-12 ENCOUNTER — Ambulatory Visit: Payer: Medicare Other | Admitting: Internal Medicine

## 2020-03-19 ENCOUNTER — Other Ambulatory Visit: Payer: Self-pay | Admitting: Neurology

## 2020-03-24 ENCOUNTER — Ambulatory Visit: Payer: Medicare Other | Admitting: Sports Medicine

## 2020-03-24 DIAGNOSIS — G4733 Obstructive sleep apnea (adult) (pediatric): Secondary | ICD-10-CM | POA: Diagnosis not present

## 2020-04-15 ENCOUNTER — Other Ambulatory Visit: Payer: Self-pay | Admitting: Osteopathic Medicine

## 2020-04-15 DIAGNOSIS — M797 Fibromyalgia: Secondary | ICD-10-CM

## 2020-04-21 DIAGNOSIS — G4733 Obstructive sleep apnea (adult) (pediatric): Secondary | ICD-10-CM | POA: Diagnosis not present

## 2020-05-04 DIAGNOSIS — M549 Dorsalgia, unspecified: Secondary | ICD-10-CM | POA: Diagnosis not present

## 2020-05-04 DIAGNOSIS — G894 Chronic pain syndrome: Secondary | ICD-10-CM | POA: Diagnosis not present

## 2020-05-04 DIAGNOSIS — M25561 Pain in right knee: Secondary | ICD-10-CM | POA: Diagnosis not present

## 2020-05-04 DIAGNOSIS — M797 Fibromyalgia: Secondary | ICD-10-CM | POA: Diagnosis not present

## 2020-05-05 DIAGNOSIS — Z7982 Long term (current) use of aspirin: Secondary | ICD-10-CM | POA: Diagnosis not present

## 2020-05-05 DIAGNOSIS — E1142 Type 2 diabetes mellitus with diabetic polyneuropathy: Secondary | ICD-10-CM | POA: Diagnosis not present

## 2020-05-05 DIAGNOSIS — Z79899 Other long term (current) drug therapy: Secondary | ICD-10-CM | POA: Diagnosis not present

## 2020-05-05 DIAGNOSIS — Z6841 Body Mass Index (BMI) 40.0 and over, adult: Secondary | ICD-10-CM | POA: Diagnosis not present

## 2020-05-05 DIAGNOSIS — Z7984 Long term (current) use of oral hypoglycemic drugs: Secondary | ICD-10-CM | POA: Diagnosis not present

## 2020-05-05 DIAGNOSIS — E669 Obesity, unspecified: Secondary | ICD-10-CM | POA: Diagnosis not present

## 2020-05-05 DIAGNOSIS — Z794 Long term (current) use of insulin: Secondary | ICD-10-CM | POA: Diagnosis not present

## 2020-05-05 DIAGNOSIS — E119 Type 2 diabetes mellitus without complications: Secondary | ICD-10-CM | POA: Diagnosis not present

## 2020-05-13 ENCOUNTER — Other Ambulatory Visit: Payer: Self-pay | Admitting: Osteopathic Medicine

## 2020-05-13 DIAGNOSIS — I10 Essential (primary) hypertension: Secondary | ICD-10-CM

## 2020-05-13 NOTE — Telephone Encounter (Signed)
[facility] requesting med refill for valsartan and rosuvastatin (written by a historical provider).

## 2020-05-16 DIAGNOSIS — G4733 Obstructive sleep apnea (adult) (pediatric): Secondary | ICD-10-CM | POA: Diagnosis not present

## 2020-05-18 ENCOUNTER — Other Ambulatory Visit: Payer: Self-pay | Admitting: Nurse Practitioner

## 2020-05-18 DIAGNOSIS — I10 Essential (primary) hypertension: Secondary | ICD-10-CM

## 2020-05-18 NOTE — Progress Notes (Unsigned)
Assessment/Plan:    1.  Essential Tremor  -Patient went off of her medication (primidone) in past and tremor got much worse.    -pt would like to increase her medication.  Left hand is worse than right but both have tremor.  Will slowly increase to primidone 3 in the AM and 3 in the PM.   -discussed decadron she is on can cause tremor.  2.  OSAS  -Patient following with Dr. Maple Hudson for BiPAP  3.  Dementia  -This was demonstrated on neurocognitive testing with Dr. Alinda Dooms in March, 2019, along with severe anxiety and depression, for which she was receiving [facility].  Memory changes is very mild today and I am not convinced of the dx  -She is on several medications which could contribute to memory change.  She is following with pain management and on Lyrica, 150 mg twice per day.  Subjective:   Dominique Russell was seen today in follow up for essential tremor.  My previous records were reviewed prior to todays visit.  This patient is accompanied in the office by her child who supplements the history.   Patient with sleep apnea.  Sent to pulmonary for management of BiPAP last visit.  She is now following with Dr. Maple Hudson.  Those records are reviewed.  She is taking primidone, 50 mg, 2 tablets at bed.  She had not tolerated this dose in the past, but then went up on it and seems to be tolerating it fine.  Since last visit, she has been following with pain management for chronic pain of the shoulder, knees, ankles.  Records are reviewed.  She is on Lyrica, 150 mg twice per day in addition to Cymbalta, Flexeril, Mobic.  She reports tremor little better after going off of amlodipine.  Still with bad tremor though and trouble pouring coffee in the AM.  Would like to increase primidone.  She is on decadron??   Current prescribed movement disorder medications: Primidone, 50 mg twice daily (an increase) (pt reports that she is taking 2 in the AM/2 in the  evening)   PREVIOUS MEDICATIONS: Current/Previously tried tremor medications:primidone (states higher dosages caused memory change), topamax (personality change), gabapentin (was on it for neuropathy); lyrica; artane (pt stated didn't help - no SE and I never saw pt on it as d/c before came back); propranolol (pt felt made her worse); lamictal (placed on by psych but states it caused tremor)  ALLERGIES:  No Known Allergies  CURRENT MEDICATIONS:  Outpatient Encounter Medications as of 05/20/2020  Medication Sig  . acetaminophen (TYLENOL) 650 MG CR tablet Take 650 mg by mouth every 8 (eight) hours as needed for pain.  . B-D UF III MINI PEN NEEDLES 31G X 5 MM MISC USE AS DIRECTED WITH INSULIN AND VICTOZA.  Marland Kitchen Cholecalciferol (VITAMIN D-3) 25 MCG (1000 UT) CAPS Take 2 capsules (2,000 Units total) by mouth daily.  . clobetasol ointment (TEMOVATE) 0.05 % Apply 1 application topically 2 (two) times daily. To affected area(s) as needed, max 2-3 weeks to avoid whitening/thinning skin  . cyclobenzaprine (FLEXERIL) 10 MG tablet One half tab PO qHS, then increase gradually to one tab TID.  Marland Kitchen dexamethasone (DECADRON) 4 MG tablet Take 1 tablet (4 mg total) by mouth 3 (three) times daily.  . diclofenac sodium (VOLTAREN) 1 % GEL Place onto the skin.  . DULoxetine (CYMBALTA) 60 MG capsule TAKE 1 CAPSULE (60 MG TOTAL) BY MOUTH DAILY. MUST MAKE APPOINTMENT  .  folic acid (FOLVITE) 800 MCG tablet Take 400 mcg by mouth daily.  . hydrochlorothiazide (HYDRODIURIL) 25 MG tablet Take 1 tablet (25 mg total) by mouth daily.  . insulin aspart protamine- aspart (NOVOLOG MIX 70/30) (70-30) 100 UNIT/ML injection Inject into the skin.  Marland Kitchen ipratropium (ATROVENT) 0.06 % nasal spray Place 2 sprays into both nostrils 4 (four) times daily.  Marland Kitchen liraglutide (VICTOZA) 18 MG/3ML SOPN Inject 1.8 mg into the skin daily.  Marland Kitchen LYRICA 150 MG capsule Take 1 tablet by mouth 2 (two) times daily.  . metFORMIN (GLUCOPHAGE-XR) 500 MG 24 hr tablet  Take 500 mg by mouth every morning.  . Omega-3 Fatty Acids (FISH OIL) 1000 MG CAPS Take 1 capsule by mouth daily.   . ONE TOUCH ULTRA TEST test strip   . primidone (MYSOLINE) 50 MG tablet 2 TABLETS IN AM AND 1 TABLET IN PM  . RADIANCE PLATINUM VITAMIN D3 125 MCG (5000 UT) TABS Take 5,000 Units by mouth once a week.  . rosuvastatin (CRESTOR) 20 MG tablet Take 20 mg by mouth daily.  . traZODone (DESYREL) 150 MG tablet TAKE 1 TABLET (150 MG TOTAL) BY MOUTH AT BEDTIME.  . valsartan (DIOVAN) 320 MG tablet TAKE 1 TABLET BY MOUTH EVERY DAY  . vitamin B-12 (CYANOCOBALAMIN) 1000 MCG tablet Take 1,000 mcg by mouth daily.   . [DISCONTINUED] amLODipine (NORVASC) 5 MG tablet TAKE 1 TABLET BY MOUTH EVERY DAY (Patient not taking: Reported on 05/20/2020)  . [DISCONTINUED] omeprazole (PRILOSEC) 40 MG capsule Take 1 capsule (40 mg total) by mouth daily. **PATIENT NEEDS OFFICE VISIT FOR ADDITIONAL REFILLS** (Patient not taking: Reported on 05/20/2020)  . [DISCONTINUED] rosuvastatin (CRESTOR) 20 MG tablet TAKE 1 TABLET BY MOUTH EVERY DAY (Patient not taking: Reported on 05/20/2020)  . [DISCONTINUED] Vitamin D, Ergocalciferol, (DRISDOL) 1.25 MG (50000 UNIT) CAPS capsule Take 50,000 Units by mouth once a week. (Patient not taking: Reported on 05/20/2020)   No facility-administered encounter medications on file as of 05/20/2020.     Objective:    PHYSICAL EXAMINATION:    VITALS:   Vitals:   05/20/20 1010  BP: 122/70  Pulse: (!) 103  SpO2: 96%  Weight: 242 lb (109.8 kg)  Height: 5\' 3"  (1.6 m)    GEN:  The patient appears stated age and is in NAD. HEENT:  Normocephalic, atraumatic.  The mucous membranes are moist.   Neurological examination:  Orientation: The patient is alert and oriented x3. Cranial nerves: There is good facial symmetry. The speech is fluent and clear. Soft palate rises symmetrically and there is no tongue deviation. Hearing is intact to conversational tone. Sensation: Sensation is intact  to light touch throughout Motor: Strength is at least antigravity x4.  Movement examination: Tone: There is normal tone in the UE/LE Abnormal movements: mild L postural tremor.  She does have tremor that is fairly severe when given a weight when the left arm is held in the proximal position.  No significant tremor in the proximal position on the right arm.  Very significant trouble with archimedes spirals on the L. Coordination:  There is no decremation with RAM's  I have reviewed and interpreted the following labs independently   Chemistry      Component Value Date/Time   NA 139 05/21/2019 1123   K 4.3 05/21/2019 1123   CL 104 05/21/2019 1123   CO2 27 05/21/2019 1123   BUN 22 05/21/2019 1123   BUN 24 (A) 07/20/2018 0000   CREATININE 1.24 (H) 05/21/2019 1123  GLU 84 07/20/2018 0000      Component Value Date/Time   CALCIUM 9.2 05/21/2019 1123   ALKPHOS 52 07/08/2016 1020   AST 18 05/21/2019 1123   ALT 16 05/21/2019 1123   BILITOT 0.3 05/21/2019 1123      Lab Results  Component Value Date   WBC 4.6 05/21/2019   HGB 11.9 05/21/2019   HCT 37.5 05/21/2019   MCV 88.9 05/21/2019   PLT 183 05/21/2019   Lab Results  Component Value Date   TSH 0.78 07/20/2018     Chemistry      Component Value Date/Time   NA 139 05/21/2019 1123   K 4.3 05/21/2019 1123   CL 104 05/21/2019 1123   CO2 27 05/21/2019 1123   BUN 22 05/21/2019 1123   BUN 24 (A) 07/20/2018 0000   CREATININE 1.24 (H) 05/21/2019 1123   GLU 84 07/20/2018 0000      Component Value Date/Time   CALCIUM 9.2 05/21/2019 1123   ALKPHOS 52 07/08/2016 1020   AST 18 05/21/2019 1123   ALT 16 05/21/2019 1123   BILITOT 0.3 05/21/2019 1123         Total time spent on today's visit was 20 minutes, including both face-to-face time and nonface-to-face time.  Time included that spent on review of records (prior notes available to me/labs/imaging if pertinent), discussing treatment and goals, answering patient's questions  and coordinating care.  Cc:  Sunnie Nielsen, DO

## 2020-05-19 ENCOUNTER — Ambulatory Visit: Payer: Medicare Other | Admitting: Neurology

## 2020-05-20 ENCOUNTER — Ambulatory Visit (INDEPENDENT_AMBULATORY_CARE_PROVIDER_SITE_OTHER): Payer: BC Managed Care – PPO | Admitting: Neurology

## 2020-05-20 ENCOUNTER — Encounter: Payer: Self-pay | Admitting: Neurology

## 2020-05-20 ENCOUNTER — Other Ambulatory Visit: Payer: Self-pay

## 2020-05-20 VITALS — BP 122/70 | HR 103 | Ht 63.0 in | Wt 242.0 lb

## 2020-05-20 DIAGNOSIS — G4733 Obstructive sleep apnea (adult) (pediatric): Secondary | ICD-10-CM

## 2020-05-20 DIAGNOSIS — G250 Essential tremor: Secondary | ICD-10-CM

## 2020-05-20 MED ORDER — PRIMIDONE 50 MG PO TABS: 150.0000 mg | ORAL_TABLET | Freq: Two times a day (BID) | ORAL | 2 refills | Status: DC | PRN

## 2020-05-20 NOTE — Patient Instructions (Signed)
Week 1:  Increase primidone 50 mg, 3 in the AM and 2 at night Week 2 and beyond:  Increase primidone to 50 mg, 3 in the AM and 3 in the PM

## 2020-05-22 DIAGNOSIS — G4733 Obstructive sleep apnea (adult) (pediatric): Secondary | ICD-10-CM | POA: Diagnosis not present

## 2020-05-25 ENCOUNTER — Encounter: Payer: Medicare Other | Admitting: Osteopathic Medicine

## 2020-05-30 ENCOUNTER — Other Ambulatory Visit: Payer: Self-pay | Admitting: Neurology

## 2020-06-16 DIAGNOSIS — G4733 Obstructive sleep apnea (adult) (pediatric): Secondary | ICD-10-CM | POA: Diagnosis not present

## 2020-06-22 ENCOUNTER — Other Ambulatory Visit: Payer: Self-pay | Admitting: Osteopathic Medicine

## 2020-06-22 DIAGNOSIS — F339 Major depressive disorder, recurrent, unspecified: Secondary | ICD-10-CM

## 2020-06-24 ENCOUNTER — Other Ambulatory Visit: Payer: Self-pay

## 2020-06-24 ENCOUNTER — Emergency Department (INDEPENDENT_AMBULATORY_CARE_PROVIDER_SITE_OTHER)
Admission: EM | Admit: 2020-06-24 | Discharge: 2020-06-24 | Disposition: A | Discharge status: Home / Self Care | Payer: Medicare Other | Source: Home / Self Care

## 2020-06-24 DIAGNOSIS — M5441 Lumbago with sciatica, right side: Secondary | ICD-10-CM

## 2020-06-24 DIAGNOSIS — T148XXA Other injury of unspecified body region, initial encounter: Secondary | ICD-10-CM

## 2020-06-24 DIAGNOSIS — M542 Cervicalgia: Secondary | ICD-10-CM | POA: Diagnosis not present

## 2020-06-24 MED ORDER — DEXAMETHASONE SODIUM PHOSPHATE 10 MG/ML IJ SOLN
10.0000 mg | Freq: Once | INTRAMUSCULAR | Status: AC
  Administered 2020-06-24: 10 mg via INTRAMUSCULAR
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
## 2020-06-24 NOTE — Discharge Instructions (Addendum)
You have received a steroid injection in the office for inflammation  Continue tylenol for pain  May take cyclobenzaprine at night as needed for muscle spasms  Follow up with this office or with primary care if symptoms are persisting.  Follow up in the ER for high fever, trouble swallowing, trouble breathing, other concerning symptoms.
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
## 2020-06-30 ENCOUNTER — Ambulatory Visit (INDEPENDENT_AMBULATORY_CARE_PROVIDER_SITE_OTHER): Payer: BC Managed Care – PPO | Admitting: Osteopathic Medicine

## 2020-06-30 ENCOUNTER — Other Ambulatory Visit: Payer: Self-pay

## 2020-06-30 ENCOUNTER — Encounter: Payer: Self-pay | Admitting: Osteopathic Medicine

## 2020-06-30 VITALS — BP 141/74 | HR 48 | Temp 97.8°F | Wt 238.1 lb

## 2020-06-30 DIAGNOSIS — E1159 Type 2 diabetes mellitus with other circulatory complications: Secondary | ICD-10-CM

## 2020-06-30 DIAGNOSIS — I152 Hypertension secondary to endocrine disorders: Secondary | ICD-10-CM

## 2020-06-30 DIAGNOSIS — E1169 Type 2 diabetes mellitus with other specified complication: Secondary | ICD-10-CM

## 2020-06-30 DIAGNOSIS — M797 Fibromyalgia: Secondary | ICD-10-CM

## 2020-06-30 DIAGNOSIS — G250 Essential tremor: Secondary | ICD-10-CM | POA: Diagnosis not present

## 2020-06-30 DIAGNOSIS — M159 Polyosteoarthritis, unspecified: Secondary | ICD-10-CM

## 2020-06-30 DIAGNOSIS — E785 Hyperlipidemia, unspecified: Secondary | ICD-10-CM

## 2020-06-30 DIAGNOSIS — G894 Chronic pain syndrome: Secondary | ICD-10-CM

## 2020-06-30 DIAGNOSIS — M069 Rheumatoid arthritis, unspecified: Secondary | ICD-10-CM

## 2020-06-30 DIAGNOSIS — M545 Low back pain, unspecified: Secondary | ICD-10-CM

## 2020-06-30 DIAGNOSIS — E559 Vitamin D deficiency, unspecified: Secondary | ICD-10-CM

## 2020-06-30 DIAGNOSIS — Z794 Long term (current) use of insulin: Secondary | ICD-10-CM

## 2020-06-30 DIAGNOSIS — F32A Depression, unspecified: Secondary | ICD-10-CM

## 2020-06-30 DIAGNOSIS — L409 Psoriasis, unspecified: Secondary | ICD-10-CM

## 2020-06-30 DIAGNOSIS — F419 Anxiety disorder, unspecified: Secondary | ICD-10-CM

## 2020-06-30 DIAGNOSIS — Z6841 Body Mass Index (BMI) 40.0 and over, adult: Secondary | ICD-10-CM

## 2020-06-30 DIAGNOSIS — Z0000 Encounter for general adult medical examination without abnormal findings: Secondary | ICD-10-CM

## 2020-06-30 DIAGNOSIS — M5030 Other cervical disc degeneration, unspecified cervical region: Secondary | ICD-10-CM

## 2020-06-30 DIAGNOSIS — G4733 Obstructive sleep apnea (adult) (pediatric): Secondary | ICD-10-CM

## 2020-06-30 MED ORDER — CYCLOBENZAPRINE HCL 10 MG PO TABS: 5.0000 mg | ORAL_TABLET | Freq: Three times a day (TID) | ORAL | 1 refills | Status: DC | PRN

## 2020-06-30 MED ORDER — PREDNISONE 10 MG (21) PO TBPK: ORAL_TABLET | ORAL | 0 refills | Status: DC
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
## 2020-06-30 NOTE — Patient Instructions (Addendum)
Back pain: refilled cyclobenzaprine and sent Rx for steroids. Referral to physical therapy please call them [phone] if you don't hear from them soon about an appointment   Other medical conditions:  1. Hypertension - blood pressure ok, continue current medicaitons  2. Benign essential tremor - following w/ Neurology 3. Type 2 diabetes mellitus with other specified complication, with long-term current use of insulin (HCC) - following w/ Endocrinology 4. OSA (obstructive sleep apnea) - following w/ Neurology and Pulmonology 5. Hyperlipidemia associated with type 2 diabetes mellitus (HCC) - checking cholesterol labs today, continue medications 6. Morbid obesity with BMI of 45.0-49.9, adult (HCC) work on diet/exercise as able to help sugars and blood pressure 7. Chronic pain syndrome - following w/ Pain Management for this and other pin conditions as below  8. DDD (degenerative disc disease), cervical 9. Generalized osteoarthritis of multiple sites 10. Fibromyalgia 11. Rheumatoid arthritis 12. Psoriasis - Continue skin medications as needed  13. Anxiety and depression - Continue Cymbalta for this and for pain problems  14. Annual physical exam - See preventive care below: caught up!    General Preventive Care  Most recent routine screening labs: ordered.   Blood pressure goal 140/90 or less.   Tobacco: don't!   Alcohol: responsible moderation is ok for most adults - if you have concerns about your alcohol intake, please talk to me!   Exercise: as tolerated to reduce risk of cardiovascular disease and diabetes. Strength training will also prevent osteoporosis.   Mental health: if need for mental health care (medicines, counseling, other), or concerns about moods, please let me know!   Sexual / Reproductive health: if need for STD testing, or if concerns with libido/pain problems, please let me know!  Advanced Directive: Living Will and/or Healthcare Power of Attorney recommended  for all adults, regardless of age or health.  Vaccines  Flu vaccine: for almost everyone, every fall.   Shingles vaccine: done  Pneumonia vaccines: done  Tetanus booster: every 10 years (due 20028)  COVID vaccine: STRONGLY RECOMMENDED  Cancer screenings   Colon cancer screening: colonoscopy 10 years after last normal colonoscopy   Breast cancer screening: mammogram annually after age 107.   Cervical cancer screening: Pap can stop at age 79 or w/ hysterectomy.   Lung cancer screening: not needed since quit > 15 years  Infection screenings  . HIV: recommended screening at least once age 72-65 . Gonorrhea/Chlamydia: screening as needed . Hepatitis C: recommended once for everyone age 79-75 . TB: certain at-risk populations, or depending on work requirements and/or travel history Other  Bone Density Test: due 06/2021

## 2020-07-01 LAB — CBC
HCT: 38.8 % (ref 35.0–45.0)
Hemoglobin: 12.5 g/dL (ref 11.7–15.5)
MCH: 28.9 pg (ref 27.0–33.0)
MCHC: 32.2 g/dL (ref 32.0–36.0)
MCV: 89.6 fL (ref 80.0–100.0)
MPV: 11.6 fL (ref 7.5–12.5)
Platelets: 157 10*3/uL (ref 140–400)
RBC: 4.33 10*6/uL (ref 3.80–5.10)
RDW: 12.5 % (ref 11.0–15.0)
WBC: 4.2 10*3/uL (ref 3.8–10.8)

## 2020-07-01 LAB — COMPLETE METABOLIC PANEL WITH GFR
AG Ratio: 1.4 (calc) (ref 1.0–2.5)
ALT: 20 U/L (ref 6–29)
AST: 28 U/L (ref 10–35)
Albumin: 4 g/dL (ref 3.6–5.1)
Alkaline phosphatase (APISO): 56 U/L (ref 37–153)
BUN: 15 mg/dL (ref 7–25)
CO2: 30 mmol/L (ref 20–32)
Calcium: 9 mg/dL (ref 8.6–10.4)
Chloride: 104 mmol/L (ref 98–110)
Creat: 0.94 mg/dL (ref 0.50–0.99)
GFR, Est African American: 73 mL/min/{1.73_m2} (ref >=60)
GFR, Est Non African American: 63 mL/min/{1.73_m2} (ref >=60)
Globulin: 2.9 g/dL (calc) (ref 1.9–3.7)
Glucose, Bld: 118 mg/dL — ABNORMAL HIGH (ref 65–99)
Potassium: 4.5 mmol/L (ref 3.5–5.3)
Sodium: 140 mmol/L (ref 135–146)
Total Bilirubin: 0.3 mg/dL (ref 0.2–1.2)
Total Protein: 6.9 g/dL (ref 6.1–8.1)

## 2020-07-01 LAB — HEMOGLOBIN A1C
Hgb A1c MFr Bld: 10 % of total Hgb — ABNORMAL HIGH (ref <5.7)
Mean Plasma Glucose: 240 mg/dL
eAG (mmol/L): 13.3 mmol/L

## 2020-07-01 LAB — LIPID PANEL
Cholesterol: 136 mg/dL (ref <200)
HDL: 53 mg/dL (ref >=50)
LDL Cholesterol (Calc): 58 mg/dL (calc)
Non-HDL Cholesterol (Calc): 83 mg/dL (calc) (ref <130)
Total CHOL/HDL Ratio: 2.6 (calc) (ref <5.0)
Triglycerides: 169 mg/dL — ABNORMAL HIGH (ref <150)

## 2020-07-01 LAB — TSH: TSH: 0.77 mIU/L (ref 0.40–4.50)

## 2020-07-01 LAB — VITAMIN D 25 HYDROXY (VIT D DEFICIENCY, FRACTURES): Vit D, 25-Hydroxy: 98 ng/mL (ref 30–100)

## 2020-07-08 DIAGNOSIS — Z7409 Other reduced mobility: Secondary | ICD-10-CM | POA: Diagnosis not present

## 2020-07-08 DIAGNOSIS — M545 Low back pain, unspecified: Secondary | ICD-10-CM | POA: Diagnosis not present

## 2020-07-08 DIAGNOSIS — M542 Cervicalgia: Secondary | ICD-10-CM | POA: Diagnosis not present

## 2020-07-08 DIAGNOSIS — G8929 Other chronic pain: Secondary | ICD-10-CM | POA: Diagnosis not present

## 2020-07-08 DIAGNOSIS — M5441 Lumbago with sciatica, right side: Secondary | ICD-10-CM | POA: Diagnosis not present

## 2020-07-15 DIAGNOSIS — M542 Cervicalgia: Secondary | ICD-10-CM | POA: Diagnosis not present

## 2020-07-15 DIAGNOSIS — Z7409 Other reduced mobility: Secondary | ICD-10-CM | POA: Diagnosis not present

## 2020-07-15 DIAGNOSIS — M5441 Lumbago with sciatica, right side: Secondary | ICD-10-CM | POA: Diagnosis not present

## 2020-07-15 DIAGNOSIS — G8929 Other chronic pain: Secondary | ICD-10-CM | POA: Diagnosis not present

## 2020-07-15 DIAGNOSIS — M545 Low back pain, unspecified: Secondary | ICD-10-CM | POA: Diagnosis not present

## 2020-07-16 DIAGNOSIS — G4733 Obstructive sleep apnea (adult) (pediatric): Secondary | ICD-10-CM | POA: Diagnosis not present

## 2020-07-22 ENCOUNTER — Telehealth: Payer: Self-pay | Admitting: Osteopathic Medicine

## 2020-07-22 NOTE — Progress Notes (Signed)
  Chronic Care Management   Outreach Note  07/22/2020 Name: Dominique Russell MRN: [id number] DOB: 12-06-1955  Referred by: Sunnie Nielsen, DO Reason for referral : No chief complaint on file.   A second unsuccessful telephone outreach was attempted today. The patient was referred to pharmacist for assistance with care management and care coordination.  Follow Up Plan:   Carmell [place] Upstream Scheduler

## 2020-07-27 DIAGNOSIS — M549 Dorsalgia, unspecified: Secondary | ICD-10-CM | POA: Diagnosis not present

## 2020-07-27 DIAGNOSIS — M797 Fibromyalgia: Secondary | ICD-10-CM | POA: Diagnosis not present

## 2020-07-27 DIAGNOSIS — M25561 Pain in right knee: Secondary | ICD-10-CM | POA: Diagnosis not present

## 2020-07-27 DIAGNOSIS — G894 Chronic pain syndrome: Secondary | ICD-10-CM | POA: Diagnosis not present

## 2020-07-30 DIAGNOSIS — G8929 Other chronic pain: Secondary | ICD-10-CM | POA: Diagnosis not present

## 2020-07-30 DIAGNOSIS — Z7409 Other reduced mobility: Secondary | ICD-10-CM | POA: Diagnosis not present

## 2020-07-30 DIAGNOSIS — M545 Low back pain, unspecified: Secondary | ICD-10-CM | POA: Diagnosis not present

## 2020-07-30 DIAGNOSIS — M5441 Lumbago with sciatica, right side: Secondary | ICD-10-CM | POA: Diagnosis not present

## 2020-07-30 DIAGNOSIS — M542 Cervicalgia: Secondary | ICD-10-CM | POA: Diagnosis not present

## 2020-08-05 DIAGNOSIS — M5441 Lumbago with sciatica, right side: Secondary | ICD-10-CM | POA: Diagnosis not present

## 2020-08-05 DIAGNOSIS — G8929 Other chronic pain: Secondary | ICD-10-CM | POA: Diagnosis not present

## 2020-08-05 DIAGNOSIS — M545 Low back pain, unspecified: Secondary | ICD-10-CM | POA: Diagnosis not present

## 2020-08-05 DIAGNOSIS — M542 Cervicalgia: Secondary | ICD-10-CM | POA: Diagnosis not present

## 2020-08-05 DIAGNOSIS — Z7409 Other reduced mobility: Secondary | ICD-10-CM | POA: Diagnosis not present

## 2020-08-12 ENCOUNTER — Other Ambulatory Visit: Payer: Self-pay | Admitting: Osteopathic Medicine

## 2020-08-12 DIAGNOSIS — G8929 Other chronic pain: Secondary | ICD-10-CM | POA: Diagnosis not present

## 2020-08-12 DIAGNOSIS — Z7409 Other reduced mobility: Secondary | ICD-10-CM | POA: Diagnosis not present

## 2020-08-12 DIAGNOSIS — M542 Cervicalgia: Secondary | ICD-10-CM | POA: Diagnosis not present

## 2020-08-12 DIAGNOSIS — M5441 Lumbago with sciatica, right side: Secondary | ICD-10-CM | POA: Diagnosis not present

## 2020-08-12 DIAGNOSIS — M545 Low back pain, unspecified: Secondary | ICD-10-CM | POA: Diagnosis not present

## 2020-08-12 DIAGNOSIS — F339 Major depressive disorder, recurrent, unspecified: Secondary | ICD-10-CM

## 2020-08-16 DIAGNOSIS — G4733 Obstructive sleep apnea (adult) (pediatric): Secondary | ICD-10-CM | POA: Diagnosis not present

## 2020-08-17 ENCOUNTER — Telehealth: Payer: Self-pay | Admitting: Osteopathic Medicine

## 2020-08-17 DIAGNOSIS — G250 Essential tremor: Secondary | ICD-10-CM | POA: Diagnosis not present

## 2020-08-17 NOTE — Progress Notes (Signed)
  Chronic Care Management   Outreach Note  08/17/2020 Name: Dominique Russell MRN: [id number] DOB: 1955-04-12  Referred by: Sunnie Nielsen, DO Reason for referral : No chief complaint on file.   Third unsuccessful telephone outreach was attempted today. The patient was referred to the pharmacist for assistance with care management and care coordination.   Follow Up Plan:   Carmell Austria Upstream Scheduler

## 2020-08-19 DIAGNOSIS — M542 Cervicalgia: Secondary | ICD-10-CM | POA: Diagnosis not present

## 2020-08-19 DIAGNOSIS — M5441 Lumbago with sciatica, right side: Secondary | ICD-10-CM | POA: Diagnosis not present

## 2020-08-19 DIAGNOSIS — Z7409 Other reduced mobility: Secondary | ICD-10-CM | POA: Diagnosis not present

## 2020-08-19 DIAGNOSIS — G8929 Other chronic pain: Secondary | ICD-10-CM | POA: Diagnosis not present

## 2020-08-19 DIAGNOSIS — M545 Low back pain, unspecified: Secondary | ICD-10-CM | POA: Diagnosis not present

## 2020-08-20 ENCOUNTER — Telehealth: Payer: Self-pay | Admitting: Neurology

## 2020-08-20 NOTE — Telephone Encounter (Signed)
Called patient and informed her per Dr. Arbutus Leas: "My guess is that she had focused ultrasound at [facility] in [place].  In my experience they like to do the procedure and not take care of the rest of the patient.  She will need to follow up with them if they did the procedure and told her to get off the medication.  They need to follow through with their care and not just do procedure and abandon the patient"  Patient stated that she did have a focused u/s and will call them again to ask about the primidone. Patient had no further questions or concerns.

## 2020-08-20 NOTE — Telephone Encounter (Signed)
My guess is that she had focused ultrasound at [facility] in [place].  In my experience they like to do the procedure and not take care of the rest of the patient.  She will need to follow up with them if they did the procedure and told her to get off the medication.  They need to follow through with their care and not just do procedure and abandon the patients

## 2020-08-20 NOTE — Telephone Encounter (Signed)
Pt called in stating she had a procedure done on her head to help with her hand shaking. That facility told her to stop taking her primidone and she wants to start back on it now.

## 2020-08-22 DIAGNOSIS — Z87891 Personal history of nicotine dependence: Secondary | ICD-10-CM | POA: Diagnosis not present

## 2020-08-22 DIAGNOSIS — N179 Acute kidney failure, unspecified: Secondary | ICD-10-CM | POA: Diagnosis not present

## 2020-08-22 DIAGNOSIS — E0965 Drug or chemical induced diabetes mellitus with hyperglycemia: Secondary | ICD-10-CM | POA: Diagnosis not present

## 2020-08-22 DIAGNOSIS — E1142 Type 2 diabetes mellitus with diabetic polyneuropathy: Secondary | ICD-10-CM | POA: Diagnosis not present

## 2020-08-22 DIAGNOSIS — G894 Chronic pain syndrome: Secondary | ICD-10-CM | POA: Diagnosis not present

## 2020-08-22 DIAGNOSIS — Z6841 Body Mass Index (BMI) 40.0 and over, adult: Secondary | ICD-10-CM | POA: Diagnosis not present

## 2020-08-22 DIAGNOSIS — R531 Weakness: Secondary | ICD-10-CM | POA: Diagnosis not present

## 2020-08-22 DIAGNOSIS — E119 Type 2 diabetes mellitus without complications: Secondary | ICD-10-CM | POA: Diagnosis not present

## 2020-08-22 DIAGNOSIS — E785 Hyperlipidemia, unspecified: Secondary | ICD-10-CM | POA: Diagnosis not present

## 2020-08-22 DIAGNOSIS — R32 Unspecified urinary incontinence: Secondary | ICD-10-CM | POA: Diagnosis not present

## 2020-08-22 DIAGNOSIS — T380X5A Adverse effect of glucocorticoids and synthetic analogues, initial encounter: Secondary | ICD-10-CM | POA: Diagnosis not present

## 2020-08-22 DIAGNOSIS — I10 Essential (primary) hypertension: Secondary | ICD-10-CM | POA: Diagnosis not present

## 2020-08-22 DIAGNOSIS — M797 Fibromyalgia: Secondary | ICD-10-CM | POA: Diagnosis not present

## 2020-08-22 DIAGNOSIS — G250 Essential tremor: Secondary | ICD-10-CM | POA: Diagnosis not present

## 2020-08-22 DIAGNOSIS — M6281 Muscle weakness (generalized): Secondary | ICD-10-CM | POA: Diagnosis not present

## 2020-08-22 DIAGNOSIS — Z794 Long term (current) use of insulin: Secondary | ICD-10-CM | POA: Diagnosis not present

## 2020-08-22 DIAGNOSIS — E669 Obesity, unspecified: Secondary | ICD-10-CM | POA: Diagnosis not present

## 2020-08-22 DIAGNOSIS — G9782 Other postprocedural complications and disorders of nervous system: Secondary | ICD-10-CM | POA: Diagnosis not present

## 2020-08-22 DIAGNOSIS — E871 Hypo-osmolality and hyponatremia: Secondary | ICD-10-CM | POA: Diagnosis not present

## 2020-08-22 DIAGNOSIS — Z79899 Other long term (current) drug therapy: Secondary | ICD-10-CM | POA: Diagnosis not present

## 2020-08-26 ENCOUNTER — Telehealth: Payer: Self-pay

## 2020-08-26 ENCOUNTER — Ambulatory Visit: Payer: BC Managed Care – PPO | Admitting: Osteopathic Medicine

## 2020-08-26 DIAGNOSIS — Z79899 Other long term (current) drug therapy: Secondary | ICD-10-CM | POA: Diagnosis not present

## 2020-08-26 DIAGNOSIS — M797 Fibromyalgia: Secondary | ICD-10-CM | POA: Diagnosis not present

## 2020-08-26 DIAGNOSIS — G8929 Other chronic pain: Secondary | ICD-10-CM | POA: Diagnosis not present

## 2020-08-26 DIAGNOSIS — Z794 Long term (current) use of insulin: Secondary | ICD-10-CM | POA: Diagnosis not present

## 2020-08-26 DIAGNOSIS — M069 Rheumatoid arthritis, unspecified: Secondary | ICD-10-CM | POA: Diagnosis not present

## 2020-08-26 DIAGNOSIS — Z9181 History of falling: Secondary | ICD-10-CM | POA: Diagnosis not present

## 2020-08-26 DIAGNOSIS — G250 Essential tremor: Secondary | ICD-10-CM | POA: Diagnosis not present

## 2020-08-26 DIAGNOSIS — E1142 Type 2 diabetes mellitus with diabetic polyneuropathy: Secondary | ICD-10-CM | POA: Diagnosis not present

## 2020-08-26 DIAGNOSIS — E1165 Type 2 diabetes mellitus with hyperglycemia: Secondary | ICD-10-CM | POA: Diagnosis not present

## 2020-08-26 DIAGNOSIS — Z87891 Personal history of nicotine dependence: Secondary | ICD-10-CM | POA: Diagnosis not present

## 2020-08-26 DIAGNOSIS — N179 Acute kidney failure, unspecified: Secondary | ICD-10-CM | POA: Diagnosis not present

## 2020-08-26 DIAGNOSIS — I119 Hypertensive heart disease without heart failure: Secondary | ICD-10-CM | POA: Diagnosis not present

## 2020-08-26 DIAGNOSIS — E785 Hyperlipidemia, unspecified: Secondary | ICD-10-CM | POA: Diagnosis not present

## 2020-08-26 DIAGNOSIS — T380X5D Adverse effect of glucocorticoids and synthetic analogues, subsequent encounter: Secondary | ICD-10-CM | POA: Diagnosis not present

## 2020-08-26 DIAGNOSIS — Z7984 Long term (current) use of oral hypoglycemic drugs: Secondary | ICD-10-CM | POA: Diagnosis not present

## 2020-08-26 DIAGNOSIS — M159 Polyosteoarthritis, unspecified: Secondary | ICD-10-CM | POA: Diagnosis not present

## 2020-08-26 NOTE — Telephone Encounter (Signed)
Spoke with Sam to give verbal permission for the 2x times a week at home PT sessions. He also expressed concerned about the patient having a blood sugar of 830 while he was with her today (08/26/2020). She also took 60 units of insulin per Sam. He also expressed concern that her A1c was 11.9 while she was admitted in the hospital.

## 2020-08-27 ENCOUNTER — Other Ambulatory Visit: Payer: Self-pay

## 2020-08-27 ENCOUNTER — Telehealth: Payer: Self-pay | Admitting: Osteopathic Medicine

## 2020-08-27 ENCOUNTER — Encounter: Payer: Self-pay | Admitting: Osteopathic Medicine

## 2020-08-27 ENCOUNTER — Ambulatory Visit (INDEPENDENT_AMBULATORY_CARE_PROVIDER_SITE_OTHER): Payer: BC Managed Care – PPO | Admitting: Osteopathic Medicine

## 2020-08-27 VITALS — BP 117/69 | HR 94 | Resp 20 | Wt 226.0 lb

## 2020-08-27 DIAGNOSIS — Z794 Long term (current) use of insulin: Secondary | ICD-10-CM

## 2020-08-27 DIAGNOSIS — G250 Essential tremor: Secondary | ICD-10-CM | POA: Diagnosis not present

## 2020-08-27 DIAGNOSIS — Z6841 Body Mass Index (BMI) 40.0 and over, adult: Secondary | ICD-10-CM

## 2020-08-27 DIAGNOSIS — R269 Unspecified abnormalities of gait and mobility: Secondary | ICD-10-CM | POA: Diagnosis not present

## 2020-08-27 DIAGNOSIS — E1169 Type 2 diabetes mellitus with other specified complication: Secondary | ICD-10-CM | POA: Diagnosis not present

## 2020-08-27 DIAGNOSIS — E1159 Type 2 diabetes mellitus with other circulatory complications: Secondary | ICD-10-CM | POA: Diagnosis not present

## 2020-08-27 DIAGNOSIS — M5030 Other cervical disc degeneration, unspecified cervical region: Secondary | ICD-10-CM

## 2020-08-27 DIAGNOSIS — G894 Chronic pain syndrome: Secondary | ICD-10-CM

## 2020-08-27 DIAGNOSIS — I152 Hypertension secondary to endocrine disorders: Secondary | ICD-10-CM

## 2020-08-27 NOTE — Progress Notes (Signed)
Dominique Russell is a 65 y.o. female who presents to  [facility] at [facility]  today, 08/27/20, seeking care for the following:  Hospital follow-up: presented to ER 08/22/20 (5 days ago) c/o 1 week of slurred speech and L arm and leg weakness after procedure to treat tremor  08/17/20: "Pt presents for elective 1. Stereotactic frame placement and 2. MRI guided focused ultrasound thalamotomy Left sided for treatment of essential tremor of Right arm."   Pt doing well, reports difficulty moving L leg but tremor in L hand has essentially resolved, though still present in R hand to lesser degree. I reviewed neuro phone note - pt had some confusion about primidone, there seems to be lack of communication w/ neurosurgeon and neurology about restarting this medication.       ASSESSMENT & PLAN with other pertinent findings:  The primary encounter diagnosis was Gait disturbance and slurred speech post neuro procedure - needs f/u w/ neurosurgery d/t potential complication! . Diagnoses of Benign essential tremor - following w/ Neurology, Hypertension associated with diabetes (HCC), Type 2 diabetes mellitus with other specified complication, with long-term current use of insulin (HCC) - following w/ Endocrinology, Morbid obesity with BMI of 45.0-49.9, adult (HCC), Chronic pain syndrome - following w/ [facility], and DDD (degenerative disc disease), cervical were also pertinent to this visit.    Patient Instructions  Keep appointment w/ neurosurgery next week Referral placed for alternate neurologist practice closer to you Referral for home health nurse to help with sugars and blood pressure Home services for help with bathing, cooking, cleaning etc are typically NOT covered by insurance, you are responsible for arranging this help if it's desired.    Orders Placed This Encounter  Procedures   Ambulatory referral to Home Health   Ambulatory referral to  Neurology    No orders of the defined types were placed in this encounter.    See below for relevant physical exam findings  See below for recent lab and imaging results reviewed  Medications, allergies, PMH, PSH, SocH, FamH reviewed below    Follow-up instructions: No follow-ups on file.                                        Exam:  BP 117/69 (BP Location: Left Arm, Patient Position: Sitting, Cuff Size: Large)   Pulse 94   Resp 20   Wt 226 lb (102.5 kg)   SpO2 95%   BMI 38.79 kg/m  Constitutional: VS see above. General Appearance: alert, well-developed, well-nourished, NAD Neck: No masses, trachea midline.  Respiratory: Normal respiratory effort. no wheeze, no rhonchi, no rales Cardiovascular: cardiac sounds faint, S1/S2 normal, no murmur, no rub/gallop auscultated. RRR. Neurological: Normal balance/coordination. No tremor. Skin: warm, dry, intact.  Psychiatric: Normal judgment/insight. Normal mood and affect. Oriented x3.   Current Meds  Medication Sig   acetaminophen (TYLENOL) 650 MG CR tablet Take 650 mg by mouth every 8 (eight) hours as needed for pain.   B-D UF III MINI PEN NEEDLES 31G X 5 MM MISC USE AS DIRECTED WITH INSULIN AND VICTOZA.   Cholecalciferol (VITAMIN D-3) 25 MCG (1000 UT) CAPS Take 2 capsules (2,000 Units total) by mouth daily.   clobetasol ointment (TEMOVATE) 0.05 % Apply 1 application topically 2 (two) times daily. To affected area(s) as needed, max 2-3 weeks to avoid whitening/thinning skin   cyclobenzaprine (  FLEXERIL) 10 MG tablet Take 0.5-1 tablets (5-10 mg total) by mouth 3 (three) times daily as needed for muscle spasms. Caution: can cause drowsiness   diclofenac sodium (VOLTAREN) 1 % GEL Place onto the skin.   DULoxetine (CYMBALTA) 60 MG capsule TAKE 1 CAPSULE (60 MG TOTAL) BY MOUTH DAILY. MUST MAKE APPOINTMENT   folic acid (FOLVITE) 800 MCG tablet Take 400 mcg by mouth daily.   hydrochlorothiazide  (HYDRODIURIL) 25 MG tablet Take 1 tablet (25 mg total) by mouth daily.   insulin aspart protamine- aspart (NOVOLOG MIX 70/30) (70-30) 100 UNIT/ML injection Inject into the skin.   ipratropium (ATROVENT) 0.06 % nasal spray Place 2 sprays into both nostrils 4 (four) times daily.   liraglutide (VICTOZA) 18 MG/3ML SOPN Inject 1.8 mg into the skin daily.   LYRICA 150 MG capsule Take 1 tablet by mouth 2 (two) times daily.   metFORMIN (GLUCOPHAGE-XR) 500 MG 24 hr tablet Take 500 mg by mouth every morning.   Omega-3 Fatty Acids (FISH OIL) 1000 MG CAPS Take 1 capsule by mouth daily.    ONE TOUCH ULTRA TEST test strip    predniSONE (STERAPRED UNI-PAK 21 TAB) 10 MG (21) TBPK tablet 12 day taper pack, use as directed   primidone (MYSOLINE) 50 MG tablet Take 3 tablets (150 mg total) by mouth 2 (two) times daily as needed.   RADIANCE PLATINUM VITAMIN D3 125 MCG (5000 UT) TABS Take 5,000 Units by mouth once a week.   rosuvastatin (CRESTOR) 20 MG tablet Take 20 mg by mouth daily.   traZODone (DESYREL) 150 MG tablet TAKE 1 TABLET BY MOUTH AT BEDTIME.   valsartan (DIOVAN) 320 MG tablet TAKE 1 TABLET BY MOUTH EVERY DAY   vitamin B-12 (CYANOCOBALAMIN) 1000 MCG tablet Take 1,000 mcg by mouth daily.     No Known Allergies  Patient Active Problem List   Diagnosis Date Noted   DDD (degenerative disc disease), cervical 01/23/2020   Otitis media, left 09/04/2019   OSA (obstructive sleep apnea) 01/22/2019   Bone disorder 09/06/2018   Essential hypertension 03/17/2016   Psoriasis 03/17/2016   Long-term insulin use in type 2 diabetes (HCC) 02/09/2015   Morbid obesity with BMI of 45.0-49.9, adult (HCC) 05/16/2014   DM (diabetes mellitus) (HCC) 01/23/2013   Low back pain 01/23/2013   Shoulder pain 01/23/2013   Bilateral knee pain 12/26/2012   Bilateral shoulder pain 12/26/2012   Essential tremor 09/26/2012   Headache 09/26/2012   Benign essential tremor 09/26/2012   Fibromyalgia 03/02/2011   Generalized  osteoarthritis of multiple sites 03/02/2011   Rheumatoid arthritis (HCC) 03/02/2011    Family History  Problem Relation Age of Onset   Depression Mother    Hypertension Mother    Kidney failure Father    Kidney failure Brother    Other Sister        MVA   Tremor Neg Hx     Social History   Tobacco Use  Smoking Status Former   Pack years: 0.00   Types: Cigarettes   Quit date: 02/22/1995   Years since quitting: 25.5  Smokeless Tobacco Never    Past Surgical History:  Procedure Laterality Date   BREAST REDUCTION SURGERY      Immunization History  Administered Date(s) Administered   Fluad Quad(high Dose 65+) 11/20/2018   H1N1 01/24/2008   Influenza Split 11/27/2007, 11/14/2008, 12/02/2014   Influenza,inj,Quad PF,6+ Mos 11/23/2012, 10/26/2016, 12/27/2017, 11/25/2019   Influenza,inj,quad, With Preservative 01/14/2014   Influenza-Unspecified 12/13/2010, 11/01/2011, 11/23/2012, 11/22/2015, 12/17/2015  PFIZER(Purple Top)SARS-COV-2 Vaccination 06/10/2019, 07/01/2019, 01/23/2020   Pneumococcal Conjugate-13 05/21/2019   Pneumococcal Polysaccharide-23 03/29/2011, 09/14/2016   Tdap 09/14/2016   Zoster Recombinat (Shingrix) 08/01/2018, 10/23/2018   Zoster, Live 01/14/2014    Recent Results (from the past 2160 hour(s))  CBC     Status: None   Collection Time: 06/30/20 10:47 AM  Result Value Ref Range   WBC 4.2 3.8 - 10.8 Thousand/uL   RBC 4.33 3.80 - 5.10 Million/uL   Hemoglobin 12.5 11.7 - 15.5 g/dL   HCT 40.9 81.1 - 91.4 %   MCV 89.6 80.0 - 100.0 fL   MCH 28.9 27.0 - 33.0 pg   MCHC 32.2 32.0 - 36.0 g/dL   RDW 78.2 95.6 - 21.3 %   Platelets 157 140 - 400 Thousand/uL   MPV 11.6 7.5 - 12.5 fL  COMPLETE METABOLIC PANEL WITH GFR     Status: Abnormal   Collection Time: 06/30/20 10:47 AM  Result Value Ref Range   Glucose, Bld 118 (H) 65 - 99 mg/dL    Comment: .            Fasting reference interval . For someone without known diabetes, a glucose value between 100 and  125 mg/dL is consistent with prediabetes and should be confirmed with a follow-up test. .    BUN 15 7 - 25 mg/dL   Creat 0.86 5.78 - 4.69 mg/dL    Comment: For patients >39 years of age, the reference limit for Creatinine is approximately 13% higher for people identified as African-American. .    GFR, Est Non African American 63 > OR = 60 mL/min/1.4m2   GFR, Est African American 73 > OR = 60 mL/min/1.37m2   BUN/Creatinine Ratio NOT APPLICABLE 6 - 22 (calc)   Sodium 140 135 - 146 mmol/L   Potassium 4.5 3.5 - 5.3 mmol/L   Chloride 104 98 - 110 mmol/L   CO2 30 20 - 32 mmol/L   Calcium 9.0 8.6 - 10.4 mg/dL   Total Protein 6.9 6.1 - 8.1 g/dL   Albumin 4.0 3.6 - 5.1 g/dL   Globulin 2.9 1.9 - 3.7 g/dL (calc)   AG Ratio 1.4 1.0 - 2.5 (calc)   Total Bilirubin 0.3 0.2 - 1.2 mg/dL   Alkaline phosphatase (APISO) 56 37 - 153 U/L   AST 28 10 - 35 U/L   ALT 20 6 - 29 U/L  Lipid panel     Status: Abnormal   Collection Time: 06/30/20 10:47 AM  Result Value Ref Range   Cholesterol 136 <200 mg/dL   HDL 53 > OR = 50 mg/dL   Triglycerides 629 (H) <150 mg/dL   LDL Cholesterol (Calc) 58 mg/dL (calc)    Comment: Reference range: <100 . Desirable range <100 mg/dL for primary prevention;   <70 mg/dL for patients with CHD or diabetic patients  with > or = 2 CHD risk factors. Marland Kitchen LDL-C is now calculated using the Martin-Hopkins  calculation, which is a validated novel method providing  better accuracy than the Friedewald equation in the  estimation of LDL-C.  Horald Pollen et al. Lenox Ahr. 5284;132(44): 2061-2068  (http://education.[facility].com/faq/FAQ164)    Total CHOL/HDL Ratio 2.6 <5.0 (calc)   Non-HDL Cholesterol (Calc) 83 <010 mg/dL (calc)    Comment: For patients with diabetes plus 1 major ASCVD risk  factor, treating to a non-HDL-C goal of <100 mg/dL  (LDL-C of <27 mg/dL) is considered a therapeutic  option.   TSH     Status: None  Collection Time: 06/30/20 10:51 AM  Result Value  Ref Range   TSH 0.77 0.40 - 4.50 mIU/L  Hemoglobin A1c     Status: Abnormal   Collection Time: 06/30/20 10:51 AM  Result Value Ref Range   Hgb A1c MFr Bld 10.0 (H) <5.7 % of total Hgb    Comment: For someone without known diabetes, a hemoglobin A1c value of 6.5% or greater indicates that they may have  diabetes and this should be confirmed with a follow-up  test. . For someone with known diabetes, a value <7% indicates  that their diabetes is well controlled and a value  greater than or equal to 7% indicates suboptimal  control. A1c targets should be individualized based on  duration of diabetes, age, comorbid conditions, and  other considerations. . Currently, no consensus exists regarding use of hemoglobin A1c for diagnosis of diabetes for children. .    Mean Plasma Glucose 240 mg/dL   eAG (mmol/L) 23.5 mmol/L  VITAMIN D 25 Hydroxy (Vit-D Deficiency, Fractures)     Status: None   Collection Time: 06/30/20 10:51 AM  Result Value Ref Range   Vit D, 25-Hydroxy 98 30 - 100 ng/mL    Comment: Vitamin D Status         25-OH Vitamin D: . Deficiency:                    <20 ng/mL Insufficiency:             20 - 29 ng/mL Optimal:                 > or = 30 ng/mL . For 25-OH Vitamin D testing on patients on  D2-supplementation and patients for whom quantitation  of D2 and D3 fractions is required, the QuestAssureD(TM) 25-OH VIT D, (D2,D3), LC/MS/MS is recommended: order  code 57322 (patients >2yrs). See Note 1 . Note 1 . For additional information, please refer to  http://education.[facility].com/faq/FAQ199  (This link is being provided for informational/ educational purposes only.)     No results found.     All questions at time of visit were answered - patient instructed to contact office with any additional concerns or updates. ER/RTC precautions were reviewed with the patient as applicable.   Please note: manual typing as well as voice recognition software may  have been used to produce this document - typos may escape review. Please contact Dr. Lyn Hollingshead for any needed clarifications.   Total encounter time on date of service, 08/27/20, was 30 minutes spent addressing problems/issues as noted above in Assessment & Plan, including time spent in discussion with patient regarding the HPI, ROS, confirming history, reviewing Assessment & Plan, as well as time spent on coordination of care, record review.

## 2020-08-27 NOTE — Telephone Encounter (Signed)
Dominique Russell - I sent referral for this patient for home health not realizing the hospital had already set up PT w/ [facility] - can we just call [facility] and add nursing for DM2 and HTN monitoring? Thanks!

## 2020-08-27 NOTE — Patient Instructions (Signed)
Keep appointment w/ neurosurgery next week Referral placed for alternate neurologist practice closer to you Referral for home health nurse to help with sugars and blood pressure Home services for help with bathing, cooking, cleaning etc are typically NOT covered by insurance, you are responsible for arranging this help if it's desired.

## 2020-08-28 DIAGNOSIS — Z7984 Long term (current) use of oral hypoglycemic drugs: Secondary | ICD-10-CM | POA: Diagnosis not present

## 2020-08-28 DIAGNOSIS — N179 Acute kidney failure, unspecified: Secondary | ICD-10-CM | POA: Diagnosis not present

## 2020-08-28 DIAGNOSIS — M069 Rheumatoid arthritis, unspecified: Secondary | ICD-10-CM | POA: Diagnosis not present

## 2020-08-28 DIAGNOSIS — Z79899 Other long term (current) drug therapy: Secondary | ICD-10-CM | POA: Diagnosis not present

## 2020-08-28 DIAGNOSIS — M797 Fibromyalgia: Secondary | ICD-10-CM | POA: Diagnosis not present

## 2020-08-28 DIAGNOSIS — I119 Hypertensive heart disease without heart failure: Secondary | ICD-10-CM | POA: Diagnosis not present

## 2020-08-28 DIAGNOSIS — G8929 Other chronic pain: Secondary | ICD-10-CM | POA: Diagnosis not present

## 2020-08-28 DIAGNOSIS — E1165 Type 2 diabetes mellitus with hyperglycemia: Secondary | ICD-10-CM | POA: Diagnosis not present

## 2020-08-28 DIAGNOSIS — M159 Polyosteoarthritis, unspecified: Secondary | ICD-10-CM | POA: Diagnosis not present

## 2020-08-28 DIAGNOSIS — Z794 Long term (current) use of insulin: Secondary | ICD-10-CM | POA: Diagnosis not present

## 2020-08-28 DIAGNOSIS — E1142 Type 2 diabetes mellitus with diabetic polyneuropathy: Secondary | ICD-10-CM | POA: Diagnosis not present

## 2020-08-28 DIAGNOSIS — Z87891 Personal history of nicotine dependence: Secondary | ICD-10-CM | POA: Diagnosis not present

## 2020-08-28 DIAGNOSIS — Z9181 History of falling: Secondary | ICD-10-CM | POA: Diagnosis not present

## 2020-08-28 DIAGNOSIS — T380X5D Adverse effect of glucocorticoids and synthetic analogues, subsequent encounter: Secondary | ICD-10-CM | POA: Diagnosis not present

## 2020-08-28 DIAGNOSIS — G250 Essential tremor: Secondary | ICD-10-CM | POA: Diagnosis not present

## 2020-08-28 DIAGNOSIS — E785 Hyperlipidemia, unspecified: Secondary | ICD-10-CM | POA: Diagnosis not present

## 2020-08-31 DIAGNOSIS — Z79899 Other long term (current) drug therapy: Secondary | ICD-10-CM | POA: Diagnosis not present

## 2020-08-31 DIAGNOSIS — G8929 Other chronic pain: Secondary | ICD-10-CM | POA: Diagnosis not present

## 2020-08-31 DIAGNOSIS — E1165 Type 2 diabetes mellitus with hyperglycemia: Secondary | ICD-10-CM | POA: Diagnosis not present

## 2020-08-31 DIAGNOSIS — M159 Polyosteoarthritis, unspecified: Secondary | ICD-10-CM | POA: Diagnosis not present

## 2020-08-31 DIAGNOSIS — I119 Hypertensive heart disease without heart failure: Secondary | ICD-10-CM | POA: Diagnosis not present

## 2020-08-31 DIAGNOSIS — E1142 Type 2 diabetes mellitus with diabetic polyneuropathy: Secondary | ICD-10-CM | POA: Diagnosis not present

## 2020-08-31 DIAGNOSIS — G250 Essential tremor: Secondary | ICD-10-CM | POA: Diagnosis not present

## 2020-08-31 DIAGNOSIS — E785 Hyperlipidemia, unspecified: Secondary | ICD-10-CM | POA: Diagnosis not present

## 2020-08-31 DIAGNOSIS — Z7984 Long term (current) use of oral hypoglycemic drugs: Secondary | ICD-10-CM | POA: Diagnosis not present

## 2020-08-31 DIAGNOSIS — Z9181 History of falling: Secondary | ICD-10-CM | POA: Diagnosis not present

## 2020-08-31 DIAGNOSIS — T380X5D Adverse effect of glucocorticoids and synthetic analogues, subsequent encounter: Secondary | ICD-10-CM | POA: Diagnosis not present

## 2020-08-31 DIAGNOSIS — M797 Fibromyalgia: Secondary | ICD-10-CM | POA: Diagnosis not present

## 2020-08-31 DIAGNOSIS — M069 Rheumatoid arthritis, unspecified: Secondary | ICD-10-CM | POA: Diagnosis not present

## 2020-08-31 DIAGNOSIS — Z794 Long term (current) use of insulin: Secondary | ICD-10-CM | POA: Diagnosis not present

## 2020-08-31 DIAGNOSIS — Z87891 Personal history of nicotine dependence: Secondary | ICD-10-CM | POA: Diagnosis not present

## 2020-08-31 DIAGNOSIS — N179 Acute kidney failure, unspecified: Secondary | ICD-10-CM | POA: Diagnosis not present

## 2020-09-02 DIAGNOSIS — G8929 Other chronic pain: Secondary | ICD-10-CM | POA: Diagnosis not present

## 2020-09-02 DIAGNOSIS — Z7409 Other reduced mobility: Secondary | ICD-10-CM | POA: Diagnosis not present

## 2020-09-02 DIAGNOSIS — G250 Essential tremor: Secondary | ICD-10-CM | POA: Diagnosis not present

## 2020-09-02 DIAGNOSIS — R269 Unspecified abnormalities of gait and mobility: Secondary | ICD-10-CM | POA: Diagnosis not present

## 2020-09-02 DIAGNOSIS — M5441 Lumbago with sciatica, right side: Secondary | ICD-10-CM | POA: Diagnosis not present

## 2020-09-02 DIAGNOSIS — M542 Cervicalgia: Secondary | ICD-10-CM | POA: Diagnosis not present

## 2020-09-03 ENCOUNTER — Telehealth: Payer: Self-pay

## 2020-09-03 NOTE — Telephone Encounter (Signed)
Sam, PT with [facility], called to report that patient has established and started going to outpatient PT. Please advise.

## 2020-09-04 NOTE — Telephone Encounter (Signed)
Attempted to contact patient for clarification. Machine full.

## 2020-09-07 NOTE — Telephone Encounter (Signed)
I would contact [facility] to see if they need anything from [place] but. I think if patient cancels or does not reschedule that should be sufficient Patient likely will need [place] to sign her PT plan from [facility]. Typically they will fax it to [place] to sign if they have already seen the patient

## 2020-09-07 NOTE — Telephone Encounter (Signed)
Spoke with patient, she states that she did not feel the in home PT was doing her any good. She feels like the PT at [facility] was helping her more and she started going back there. I guess that means we need to send a D/C order to [facility] for PT?

## 2020-09-08 DIAGNOSIS — E119 Type 2 diabetes mellitus without complications: Secondary | ICD-10-CM | POA: Diagnosis not present

## 2020-09-08 DIAGNOSIS — Z7982 Long term (current) use of aspirin: Secondary | ICD-10-CM | POA: Diagnosis not present

## 2020-09-08 DIAGNOSIS — Z794 Long term (current) use of insulin: Secondary | ICD-10-CM | POA: Diagnosis not present

## 2020-09-08 DIAGNOSIS — E1142 Type 2 diabetes mellitus with diabetic polyneuropathy: Secondary | ICD-10-CM | POA: Diagnosis not present

## 2020-09-08 DIAGNOSIS — Z79899 Other long term (current) drug therapy: Secondary | ICD-10-CM | POA: Diagnosis not present

## 2020-09-08 DIAGNOSIS — Z7984 Long term (current) use of oral hypoglycemic drugs: Secondary | ICD-10-CM | POA: Diagnosis not present

## 2020-09-08 NOTE — Telephone Encounter (Signed)
Spoke with Sam from [place], they do not require any additional information from [place].

## 2020-09-09 DIAGNOSIS — M542 Cervicalgia: Secondary | ICD-10-CM | POA: Diagnosis not present

## 2020-09-09 DIAGNOSIS — R269 Unspecified abnormalities of gait and mobility: Secondary | ICD-10-CM | POA: Diagnosis not present

## 2020-09-09 DIAGNOSIS — G250 Essential tremor: Secondary | ICD-10-CM | POA: Diagnosis not present

## 2020-09-09 DIAGNOSIS — Z7409 Other reduced mobility: Secondary | ICD-10-CM | POA: Diagnosis not present

## 2020-09-09 DIAGNOSIS — M5441 Lumbago with sciatica, right side: Secondary | ICD-10-CM | POA: Diagnosis not present

## 2020-09-09 DIAGNOSIS — G8929 Other chronic pain: Secondary | ICD-10-CM | POA: Diagnosis not present

## 2020-09-11 ENCOUNTER — Ambulatory Visit (HOSPITAL_BASED_OUTPATIENT_CLINIC_OR_DEPARTMENT_OTHER)
Admission: RE | Admit: 2020-09-11 | Discharge: 2020-09-11 | Disposition: A | Discharge status: Home / Self Care | Payer: BC Managed Care – PPO | Source: Ambulatory Visit | Attending: Physician Assistant | Admitting: Physician Assistant

## 2020-09-11 ENCOUNTER — Ambulatory Visit (INDEPENDENT_AMBULATORY_CARE_PROVIDER_SITE_OTHER): Payer: BC Managed Care – PPO | Admitting: Physician Assistant

## 2020-09-11 ENCOUNTER — Encounter: Payer: Self-pay | Admitting: Physician Assistant

## 2020-09-11 ENCOUNTER — Other Ambulatory Visit: Payer: Self-pay

## 2020-09-11 VITALS — BP 116/60 | HR 93 | Temp 98.5°F | Ht 64.0 in | Wt 237.0 lb

## 2020-09-11 DIAGNOSIS — R82998 Other abnormal findings in urine: Secondary | ICD-10-CM

## 2020-09-11 DIAGNOSIS — M25571 Pain in right ankle and joints of right foot: Secondary | ICD-10-CM

## 2020-09-11 DIAGNOSIS — M7989 Other specified soft tissue disorders: Secondary | ICD-10-CM | POA: Diagnosis not present

## 2020-09-11 DIAGNOSIS — R609 Edema, unspecified: Secondary | ICD-10-CM

## 2020-09-11 DIAGNOSIS — R81 Glycosuria: Secondary | ICD-10-CM

## 2020-09-11 DIAGNOSIS — R600 Localized edema: Secondary | ICD-10-CM | POA: Insufficient documentation

## 2020-09-11 DIAGNOSIS — E790 Hyperuricemia without signs of inflammatory arthritis and tophaceous disease: Secondary | ICD-10-CM

## 2020-09-11 LAB — CBC WITH DIFFERENTIAL/PLATELET
Absolute Monocytes: 480 cells/uL (ref 200–950)
Basophils Absolute: 30 cells/uL (ref 0–200)
Basophils Relative: 0.6 %
Eosinophils Absolute: 120 cells/uL (ref 15–500)
Eosinophils Relative: 2.4 %
HCT: 36.4 % (ref 35.0–45.0)
Hemoglobin: 11.7 g/dL (ref 11.7–15.5)
Lymphs Abs: 2415 cells/uL (ref 850–3900)
MCH: 28.7 pg (ref 27.0–33.0)
MCHC: 32.1 g/dL (ref 32.0–36.0)
MCV: 89.4 fL (ref 80.0–100.0)
MPV: 10.6 fL (ref 7.5–12.5)
Monocytes Relative: 9.6 %
Neutro Abs: 1955 cells/uL (ref 1500–7800)
Neutrophils Relative %: 39.1 %
Platelets: 226 10*3/uL (ref 140–400)
RBC: 4.07 10*6/uL (ref 3.80–5.10)
RDW: 12.1 % (ref 11.0–15.0)
Total Lymphocyte: 48.3 %
WBC: 5 10*3/uL (ref 3.8–10.8)

## 2020-09-11 LAB — COMPLETE METABOLIC PANEL WITH GFR
AG Ratio: 1.2 (calc) (ref 1.0–2.5)
ALT: 23 U/L (ref 6–29)
AST: 28 U/L (ref 10–35)
Albumin: 3.9 g/dL (ref 3.6–5.1)
Alkaline phosphatase (APISO): 83 U/L (ref 37–153)
BUN: 16 mg/dL (ref 7–25)
CO2: 29 mmol/L (ref 20–32)
Calcium: 9.1 mg/dL (ref 8.6–10.4)
Chloride: 102 mmol/L (ref 98–110)
Creat: 0.91 mg/dL (ref 0.50–1.05)
Globulin: 3.2 g/dL (calc) (ref 1.9–3.7)
Glucose, Bld: 76 mg/dL (ref 65–99)
Potassium: 4.8 mmol/L (ref 3.5–5.3)
Sodium: 138 mmol/L (ref 135–146)
Total Bilirubin: 0.3 mg/dL (ref 0.2–1.2)
Total Protein: 7.1 g/dL (ref 6.1–8.1)
eGFR: 70 mL/min/{1.73_m2} (ref >=60)

## 2020-09-11 LAB — POCT URINALYSIS DIP (CLINITEK)
Bilirubin, UA: NEGATIVE
Blood, UA: NEGATIVE
Glucose, UA: 100 mg/dL — AB
Ketones, POC UA: NEGATIVE mg/dL
Nitrite, UA: NEGATIVE
POC PROTEIN,UA: NEGATIVE
Spec Grav, UA: 1.01 (ref 1.010–1.025)
Urobilinogen, UA: 0.2 E.U./dL
pH, UA: 5.5 (ref 5.0–8.0)

## 2020-09-11 LAB — URIC ACID: Uric Acid, Serum: 9.3 mg/dL — ABNORMAL HIGH (ref 2.5–7.0)

## 2020-09-11 LAB — TSH+FREE T4: TSH W/REFLEX TO FT4: 0.71 mIU/L (ref 0.40–4.50)

## 2020-09-11 NOTE — Patient Instructions (Signed)

## 2020-09-11 NOTE — Progress Notes (Signed)
Acute Office Visit  Subjective:    Patient ID: Dominique Russell, female    DOB: 1956/02/17, 65 y.o.   MRN: [id number]  Chief Complaint  Patient presents with   Foot Swelling    HPI  She is accompanied by her daughter, Donise Woodle, today  Patient with PMH of DM2, RA, OA, neuropathy, fibromyalgia and recent hospitalization is in today for new onset bilateral foot/ankle edema and right great toe pain She reports edema started approx 3 weeks ago while hospitalized She began having pain and swelling in her right foot mainly at the base of her great toe for the last week - she denies hx of gout She denies hx of VTE or CHF - had an Echo on 08/23/20 - showed mild LV hypertrophy with normal EF of 55-60% She has diabetic peripheral neuropathy and chronic pain/fibro for which she takes Lyrica and Cymbalta   She reports recently completing a steroid taper and was having home glucose readings in the 500's. She states readings are back in a normal range now. She saw Endo on 09/08/20 - POC glucose was 81  She also was not taking HCTZ but re-started it the last few days  Past Medical History:  Diagnosis Date   Diabetes ([place])    Essential tremor    Fibromyalgia    Hypertension     Past Surgical History:  Procedure Laterality Date   BREAST REDUCTION SURGERY      Family History  Problem Relation Age of Onset   Depression Mother    Hypertension Mother    Kidney failure Father    Kidney failure Brother    Other Sister        MVA   Tremor Neg Hx     Social History   Socioeconomic History   Marital status: Legally Separated    Spouse name: Not on file   Number of children: 2   Years of education: HS   Highest education level: Not on file  Occupational History   Not on file  Tobacco Use   Smoking status: Former    Types: Cigarettes    Quit date: 02/22/1995    Years since quitting: 25.5   Smokeless tobacco: Never  Vaping Use   Vaping Use: Never used  Substance and Sexual  Activity   Alcohol use: No   Drug use: No   Sexual activity: Not Currently  Other Topics Concern   Not on file  Social History Narrative   Patient lives at home with her daughter.   Caffeine Use: drinks regurlarly   Social Determinants of Health   Financial Resource Strain: Not on file  Food Insecurity: Not on file  Transportation Needs: Not on file  Physical Activity: Not on file  Stress: Not on file  Social Connections: Not on file  Intimate Partner Violence: Not on file    Outpatient Medications Prior to Visit  Medication Sig Dispense Refill   acetaminophen (TYLENOL) 650 MG CR tablet Take 650 mg by mouth every 8 (eight) hours as needed for pain.     B-D UF III MINI PEN NEEDLES 31G X 5 MM MISC USE AS DIRECTED WITH INSULIN AND VICTOZA.  11   Cholecalciferol (VITAMIN D-3) 25 MCG (1000 UT) CAPS Take 2 capsules (2,000 Units total) by mouth daily. 180 capsule 3   clobetasol ointment (TEMOVATE) 2.50 % Apply 1 application topically 2 (two) times daily. To affected area(s) as needed, max 2-3 weeks to avoid whitening/thinning skin 45 g 1  diclofenac sodium (VOLTAREN) 1 % GEL Place onto the skin.     DULoxetine (CYMBALTA) 60 MG capsule TAKE 1 CAPSULE (60 MG TOTAL) BY MOUTH DAILY. MUST MAKE APPOINTMENT 90 capsule 1   folic acid (FOLVITE) 409 MCG tablet Take 400 mcg by mouth daily.     hydrochlorothiazide (HYDRODIURIL) 25 MG tablet Take 1 tablet (25 mg total) by mouth daily. 90 tablet 1   insulin aspart protamine- aspart (NOVOLOG MIX 70/30) (70-30) 100 UNIT/ML injection Inject into the skin.     ipratropium (ATROVENT) 0.06 % nasal spray Place 2 sprays into both nostrils 4 (four) times daily. 15 mL 5   LYRICA 150 MG capsule Take 1 tablet by mouth 2 (two) times daily.     metFORMIN (GLUCOPHAGE-XR) 500 MG 24 hr tablet Take 500 mg by mouth every morning.     Omega-3 Fatty Acids (FISH OIL) 1000 MG CAPS Take 1 capsule by mouth daily.      ONE TOUCH ULTRA TEST test strip      primidone  (MYSOLINE) 50 MG tablet Take 3 tablets (150 mg total) by mouth 2 (two) times daily as needed. 540 tablet 2   RADIANCE PLATINUM VITAMIN D3 125 MCG (5000 UT) TABS Take 5,000 Units by mouth once a week.     rosuvastatin (CRESTOR) 20 MG tablet Take 20 mg by mouth daily.     traZODone (DESYREL) 150 MG tablet TAKE 1 TABLET BY MOUTH AT BEDTIME. 90 tablet 1   valsartan (DIOVAN) 320 MG tablet TAKE 1 TABLET BY MOUTH EVERY DAY 90 tablet 3   vitamin B-12 (CYANOCOBALAMIN) 1000 MCG tablet Take 1,000 mcg by mouth daily.      cyclobenzaprine (FLEXERIL) 10 MG tablet Take 0.5-1 tablets (5-10 mg total) by mouth 3 (three) times daily as needed for muscle spasms. Caution: can cause drowsiness 60 tablet 1   liraglutide (VICTOZA) 18 MG/3ML SOPN Inject 1.8 mg into the skin daily.     predniSONE (STERAPRED UNI-PAK 21 TAB) 10 MG (21) TBPK tablet 12 day taper pack, use as directed 1 tablet 0   No facility-administered medications prior to visit.    No Known Allergies  Review of Systems     Objective:    Physical Exam Vitals reviewed.  Constitutional:      Appearance: She is obese. She is not ill-appearing or toxic-appearing.  Cardiovascular:     Rate and Rhythm: Normal rate and regular rhythm.     Heart sounds: S1 normal and S2 normal. No murmur heard. Pulmonary:     Effort: Pulmonary effort is normal.     Breath sounds: Normal breath sounds and air entry. No wheezing, rhonchi or rales.  Musculoskeletal:        General: Swelling (bilateral ankles, R>L) and tenderness (R forefoot) present.     Right lower leg: 2+ Pitting Edema present.     Left lower leg: 2+ Pitting Edema present.  Feet:     Right foot:     Skin integrity: Erythema (hyperpigmentation overlying the first MTP), warmth (1st MTP) and dry skin present. No ulcer or skin breakdown.     Left foot:     Skin integrity: Skin integrity normal.  Neurological:     Mental Status: She is alert and oriented to person, place, and time.     Gait: Gait  abnormal (slow, antalgic, ambulating with walker).    BP 116/60   Pulse 93   Temp 98.5 F (36.9 C) (Oral)   Ht _0  (1.626 m)  Wt 237 lb (107.5 kg)   SpO2 96%   BMI 40.68 kg/m  Wt Readings from Last 3 Encounters:  09/11/20 237 lb (107.5 kg)  08/27/20 226 lb (102.5 kg)  06/30/20 238 lb 1.9 oz (108 kg)    Health Maintenance Due  Topic Date Due   OPHTHALMOLOGY EXAM  08/27/2019   COVID-19 Vaccine (4 - Booster for Pfizer series) 04/22/2020   FOOT EXAM  05/20/2020    There are no preventive care reminders to display for this patient.   Lab Results  Component Value Date   TSH 0.77 06/30/2020   Lab Results  Component Value Date   WBC 5.0 09/11/2020   HGB 11.7 09/11/2020   HCT 36.4 09/11/2020   MCV 89.4 09/11/2020   PLT 226 09/11/2020   Lab Results  Component Value Date   NA 138 09/11/2020   K 4.8 09/11/2020   CO2 29 09/11/2020   GLUCOSE 76 09/11/2020   BUN 16 09/11/2020   CREATININE 0.91 09/11/2020   BILITOT 0.3 09/11/2020   ALKPHOS 52 07/08/2016   AST 28 09/11/2020   ALT 23 09/11/2020   PROT 7.1 09/11/2020   ALBUMIN 4.1 07/08/2016   CALCIUM 9.1 09/11/2020   EGFR 70 09/11/2020   Lab Results  Component Value Date   CHOL 136 06/30/2020   Lab Results  Component Value Date   HDL 53 06/30/2020   Lab Results  Component Value Date   LDLCALC 58 06/30/2020   Lab Results  Component Value Date   TRIG 169 (H) 06/30/2020   Lab Results  Component Value Date   CHOLHDL 2.6 06/30/2020   Lab Results  Component Value Date   HGBA1C 10.0 (H) 06/30/2020     Assessment & Plan:   Problem List Items Addressed This Visit   None Visit Diagnoses     Great toe pain, right    -  Primary   Peripheral edema       Relevant Orders   US Venous Img Lower Bilateral (DVT)   COMPLETE METABOLIC PANEL WITH GFR (Completed)   CBC with Differential/Platelet (Completed)   Uric acid (Completed)   TSH + free T4 (Completed)   POCT URINALYSIS DIP (CLINITEK) (Completed)    Arthralgia of right foot       Relevant Orders   COMPLETE METABOLIC PANEL WITH GFR (Completed)   CBC with Differential/Platelet (Completed)   Uric acid (Completed)   Glucosuria       Urine leukocytes       Relevant Orders   Urine Culture      New onset peripheral edema x 3 weeks At moderate risk for DVT by Wells criteria given recent surgery/hosp/immobilization STAT Venous US pending to r/o DVT - scheduled at [facility] at 5:30pm STAT CMP, CBC pending Thankfully has a recent Echo showing normal LVEF so BNP deferred UA personally reviewed - trace leuks, culture pending Patient gave verbal consent for me to call her daughter with results tonight - Kade Rickels [phone] - she will be staying with her daughter in [place] If prescription needed, will e-prescribe to [facility] [place] I have pended Eliquis and confirmed there are no active contraindications   2. Right foot pain - swelling of 1st MTP Suspect gout - no evidence of diabetic ulcers or foot infection Uric acid pending Await labs prior to prescribing Colchicine - steroids would be contraindicated due to recent steroid use and hyperglycemia with glucosuria  3. Glucosuria in the setting of Type  2 DM No ketones - CMP pending She recently completed a steroid taper and reports hyperglycemia during steroid use that has improved Following with Endocrinology (visit in the last week) and currently on insulin titration  Cont current meds  Reviewed labs at 6:01 pm CBC, CMP, TSH unremarkable Uric acid elevated at 9.3  Reviewed [place] report 6:52 pm No evidence of DVT Left detailed voicemail and sent MyChart message   No orders of the defined types were placed in this encounter.    Trixie Dredge, [place]

## 2020-09-13 LAB — URINE CULTURE
MICRO NUMBER:: 12154743
SPECIMEN QUALITY:: ADEQUATE

## 2020-09-14 NOTE — Telephone Encounter (Signed)
-----   Message from [place], [place] sent at 09/14/2020 10:40 AM EDT ----- No major abnormalities on labs Kidneys and electrolytes look great Uric acid is high - which suggests the right foot pain is due to gout Elevate legs and wear compression socks or stockings during the day Follow-up with PCP to discuss treatment for gout and peripheral edema due to venous insufficiency

## 2020-09-14 NOTE — Telephone Encounter (Signed)
Pt informed of results.  Pt expressed understanding and is agreeable. Pt has an appt for 09/15/2020. Tiajuana Amass, CMA

## 2020-09-15 ENCOUNTER — Other Ambulatory Visit: Payer: Self-pay

## 2020-09-15 ENCOUNTER — Encounter: Payer: Self-pay | Admitting: Osteopathic Medicine

## 2020-09-15 ENCOUNTER — Ambulatory Visit (INDEPENDENT_AMBULATORY_CARE_PROVIDER_SITE_OTHER): Payer: BC Managed Care – PPO | Admitting: Osteopathic Medicine

## 2020-09-15 VITALS — BP 128/90 | HR 105 | Temp 98.0°F | Wt 233.1 lb

## 2020-09-15 DIAGNOSIS — E790 Hyperuricemia without signs of inflammatory arthritis and tophaceous disease: Secondary | ICD-10-CM | POA: Diagnosis not present

## 2020-09-15 DIAGNOSIS — R609 Edema, unspecified: Secondary | ICD-10-CM

## 2020-09-15 DIAGNOSIS — E1169 Type 2 diabetes mellitus with other specified complication: Secondary | ICD-10-CM | POA: Diagnosis not present

## 2020-09-15 DIAGNOSIS — Z794 Long term (current) use of insulin: Secondary | ICD-10-CM

## 2020-09-15 DIAGNOSIS — M10071 Idiopathic gout, right ankle and foot: Secondary | ICD-10-CM | POA: Diagnosis not present

## 2020-09-15 DIAGNOSIS — G894 Chronic pain syndrome: Secondary | ICD-10-CM

## 2020-09-15 MED ORDER — COLCHICINE 0.6 MG PO TABS: 0.6000 mg | ORAL_TABLET | Freq: Every day | ORAL | 0 refills | Status: DC

## 2020-09-15 MED ORDER — ALLOPURINOL 300 MG PO TABS
300.0000 mg | ORAL_TABLET | Freq: Every day | ORAL | 1 refills | Status: DC
Start: 2020-09-15 — End: 2021-01-20

## 2020-09-15 NOTE — Progress Notes (Signed)
Dominique Russell is a 65 y.o. female who presents to  [place] at [facility]  today, 09/15/20, seeking care for the following:  Follow up edema - seen 4 days ago 09/11/20. At that time, reported edema x3 weeks while hospitalized, pain and sweling in R foot x1 week. Echo 08/23/20 milf LVH and EF 55-60. DVT was r/o on date of visit. Elevated uric acid but other labs ok - concern for gout, and symptoms have since improved      ASSESSMENT & PLAN with other pertinent findings:  The primary encounter diagnosis was Acute idiopathic gout of right foot. Diagnoses of Hyperuricemia, Peripheral edema, Type 2 diabetes mellitus with other specified complication, with long-term current use of insulin ([place]) - following w/ Endocrinology, and Chronic pain syndrome - following w/ Pain Mgt were also pertinent to this visit.   Unsure if recent pain truly d/t gout, flare would usually cause low uric acid levels, but given high levels and other pain syndromes will go ahead and start gout prophylaxis as below.   Patient Instructions  Plan: Starting gout treatment Colchicine for 3 months Allopurinol indefinitely Repeat labs in 4-6 weeks  Orders Placed This Encounter  Procedures   COMPLETE METABOLIC PANEL WITH GFR   Uric acid    Meds ordered this encounter  Medications   colchicine 0.6 MG tablet    Sig: Take 1 tablet (0.6 mg total) by mouth daily.    Dispense:  90 tablet    Refill:  0   allopurinol (ZYLOPRIM) 300 MG tablet    Sig: Take 1 tablet (300 mg total) by mouth daily.    Dispense:  90 tablet    Refill:  1     See below for relevant physical exam findings  See below for recent lab and imaging results reviewed  Medications, allergies, PMH, PSH, SocH, FamH reviewed below    Follow-up instructions: Return for lab visit (non-fasting) in 4-6 weeks to monitor on new medications.  .                                        Exam:  BP 128/90 (BP Location: Left Arm, Patient Position: Sitting, Cuff Size: Large)   Pulse (!) 105   Temp 98 F (36.7 C) (Oral)   Wt 233 lb 1.3 oz (105.7 kg)   BMI 40.01 kg/m  Constitutional: VS see above. General Appearance: alert, well-developed, well-nourished, NAD Neck: No masses, trachea midline.  Respiratory: Normal respiratory effort. no wheeze, no rhonchi, no rales Cardiovascular: S1/S2 normal, no murmur, no rub/gallop auscultated. RRR.  Musculoskeletal: Gait normal. Symmetric and independent movement of all extremities. No edema/erythrema 1st MTP bilaterally, trace edema bilaterally nonpitting  Neurological: Normal balance/coordination. No tremor. Skin: warm, dry, intact.  Psychiatric: Normal judgment/insight. Normal mood and affect. Oriented x3.   Current Meds  Medication Sig   acetaminophen (TYLENOL) 650 MG CR tablet Take 650 mg by mouth every 8 (eight) hours as needed for pain.   allopurinol (ZYLOPRIM) 300 MG tablet Take 1 tablet (300 mg total) by mouth daily.   B-D UF III MINI PEN NEEDLES 31G X 5 MM MISC USE AS DIRECTED WITH INSULIN AND VICTOZA.   Cholecalciferol (VITAMIN D-3) 25 MCG (1000 UT) CAPS Take 2 capsules (2,000 Units total) by mouth daily.   clobetasol ointment (TEMOVATE) 5.64 % Apply 1 application topically 2 (two) times daily.  To affected area(s) as needed, max 2-3 weeks to avoid whitening/thinning skin   colchicine 0.6 MG tablet Take 1 tablet (0.6 mg total) by mouth daily.   diclofenac sodium (VOLTAREN) 1 % GEL Place onto the skin.   DULoxetine (CYMBALTA) 60 MG capsule TAKE 1 CAPSULE (60 MG TOTAL) BY MOUTH DAILY. MUST MAKE APPOINTMENT   folic acid (FOLVITE) 381 MCG tablet Take 400 mcg by mouth daily.   hydrochlorothiazide (HYDRODIURIL) 25 MG tablet Take 1 tablet (25 mg total) by mouth daily.   insulin aspart protamine- aspart (NOVOLOG MIX 70/30) (70-30) 100 UNIT/ML injection  Inject into the skin.   ipratropium (ATROVENT) 0.06 % nasal spray Place 2 sprays into both nostrils 4 (four) times daily.   LYRICA 150 MG capsule Take 1 tablet by mouth 2 (two) times daily.   metFORMIN (GLUCOPHAGE-XR) 500 MG 24 hr tablet Take 500 mg by mouth every morning.   Omega-3 Fatty Acids (FISH OIL) 1000 MG CAPS Take 1 capsule by mouth daily.    ONE TOUCH ULTRA TEST test strip    primidone (MYSOLINE) 50 MG tablet Take 3 tablets (150 mg total) by mouth 2 (two) times daily as needed.   RADIANCE PLATINUM VITAMIN D3 125 MCG (5000 UT) TABS Take 5,000 Units by mouth once a week.   rosuvastatin (CRESTOR) 20 MG tablet Take 20 mg by mouth daily.   traZODone (DESYREL) 150 MG tablet TAKE 1 TABLET BY MOUTH AT BEDTIME.   valsartan (DIOVAN) 320 MG tablet TAKE 1 TABLET BY MOUTH EVERY DAY   vitamin B-12 (CYANOCOBALAMIN) 1000 MCG tablet Take 1,000 mcg by mouth daily.     No Known Allergies  Patient Active Problem List   Diagnosis Date Noted   Hyperuricemia 09/11/2020   Arthralgia of right foot 09/11/2020   Peripheral edema 09/11/2020   DDD (degenerative disc disease), cervical 01/23/2020   Otitis media, left 09/04/2019   OSA (obstructive sleep apnea) 01/22/2019   Bone disorder 09/06/2018   Essential hypertension 03/17/2016   Psoriasis 03/17/2016   Long-term insulin use in type 2 diabetes ([place]) 02/09/2015   Morbid obesity with BMI of 45.0-49.9, adult ([place]) 05/16/2014   DM (diabetes mellitus) ([place]) 01/23/2013   Low back pain 01/23/2013   Shoulder pain 01/23/2013   Bilateral knee pain 12/26/2012   Bilateral shoulder pain 12/26/2012   Essential tremor 09/26/2012   Headache 09/26/2012   Benign essential tremor 09/26/2012   Fibromyalgia 03/02/2011   Generalized osteoarthritis of multiple sites 03/02/2011   Rheumatoid arthritis ([place]) 03/02/2011    Family History  Problem Relation Age of Onset   Depression Mother    Hypertension Mother    Kidney failure Father    Kidney failure Brother     Other Sister        MVA   Tremor Neg Hx     Social History   Tobacco Use  Smoking Status Former   Types: Cigarettes   Quit date: 02/22/1995   Years since quitting: 25.5  Smokeless Tobacco Never    Past Surgical History:  Procedure Laterality Date   BREAST REDUCTION SURGERY      Immunization History  Administered Date(s) Administered   Fluad Quad(high Dose 65+) 11/20/2018   H1N1 01/24/2008   Influenza Split 11/27/2007, 11/14/2008, 12/02/2014   Influenza,inj,Quad PF,6+ Mos 11/23/2012, 10/26/2016, 12/27/2017, 11/25/2019   Influenza,inj,quad, With Preservative 01/14/2014   Influenza-Unspecified 12/13/2010, 11/01/2011, 11/23/2012, 11/22/2015, 12/17/2015   PFIZER(Purple Top)SARS-COV-2 Vaccination 06/10/2019, 07/01/2019, 01/23/2020   Pneumococcal Conjugate-13 05/21/2019   Pneumococcal Polysaccharide-23 03/29/2011, 09/14/2016  Tdap 09/14/2016   Zoster Recombinat (Shingrix) 08/01/2018, 10/23/2018   Zoster, Live 01/14/2014    Recent Results (from the past 2160 hour(s))  CBC     Status: None   Collection Time: 06/30/20 10:47 AM  Result Value Ref Range   WBC 4.2 3.8 - 10.8 Thousand/uL   RBC 4.33 3.80 - 5.10 Million/uL   Hemoglobin 12.5 11.7 - 15.5 g/dL   HCT 38.8 35.0 - 45.0 %   MCV 89.6 80.0 - 100.0 fL   MCH 28.9 27.0 - 33.0 pg   MCHC 32.2 32.0 - 36.0 g/dL   RDW 12.5 11.0 - 15.0 %   Platelets 157 140 - 400 Thousand/uL   MPV 11.6 7.5 - 12.5 fL  COMPLETE METABOLIC PANEL WITH GFR     Status: Abnormal   Collection Time: 06/30/20 10:47 AM  Result Value Ref Range   Glucose, Bld 118 (H) 65 - 99 mg/dL    Comment: .            Fasting reference interval . For someone without known diabetes, a glucose value between 100 and 125 mg/dL is consistent with prediabetes and should be confirmed with a follow-up test. .    BUN 15 7 - 25 mg/dL   Creat 0.94 0.50 - 0.99 mg/dL    Comment: For patients >11 years of age, the reference limit for Creatinine is approximately 13% higher  for people identified as African-American. .    GFR, Est Non African American 63 > OR = 60 mL/min/1.62m   GFR, Est African American 73 > OR = 60 mL/min/1.752m  BUN/Creatinine Ratio NOT APPLICABLE 6 - 22 (calc)   Sodium 140 135 - 146 mmol/L   Potassium 4.5 3.5 - 5.3 mmol/L   Chloride 104 98 - 110 mmol/L   CO2 30 20 - 32 mmol/L   Calcium 9.0 8.6 - 10.4 mg/dL   Total Protein 6.9 6.1 - 8.1 g/dL   Albumin 4.0 3.6 - 5.1 g/dL   Globulin 2.9 1.9 - 3.7 g/dL (calc)   AG Ratio 1.4 1.0 - 2.5 (calc)   Total Bilirubin 0.3 0.2 - 1.2 mg/dL   Alkaline phosphatase (APISO) 56 37 - 153 U/L   AST 28 10 - 35 U/L   ALT 20 6 - 29 U/L  Lipid panel     Status: Abnormal   Collection Time: 06/30/20 10:47 AM  Result Value Ref Range   Cholesterol 136 <200 mg/dL   HDL 53 > OR = 50 mg/dL   Triglycerides 169 (H) <150 mg/dL   LDL Cholesterol (Calc) 58 mg/dL (calc)    Comment: Reference range: <100 . Desirable range <100 mg/dL for primary prevention;   <70 mg/dL for patients with CHD or diabetic patients  with > or = 2 CHD risk factors. . Marland KitchenDL-C is now calculated using the Martin-Hopkins  calculation, which is a validated novel method providing  better accuracy than the Friedewald equation in the  estimation of LDL-C.  MaCresenciano Genret al. JAAnnamaria [id number]  (http://education.[facility].com/faq/FAQ164)    Total CHOL/HDL Ratio 2.6 <5.0 (calc)   Non-HDL Cholesterol (Calc) 83 <130 mg/dL (calc)    Comment: For patients with diabetes plus 1 major ASCVD risk  factor, treating to a non-HDL-C goal of <100 mg/dL  (LDL-C of <70 mg/dL) is considered a therapeutic  option.   TSH     Status: None   Collection Time: 06/30/20 10:51 AM  Result Value Ref Range   TSH 0.77 0.40 - 4.50 mIU/L  Hemoglobin A1c     Status: Abnormal   Collection Time: 06/30/20 10:51 AM  Result Value Ref Range   Hgb A1c MFr Bld 10.0 (H) <5.7 % of total Hgb    Comment: For someone without known diabetes, a hemoglobin  A1c value of 6.5% or greater indicates that they may have  diabetes and this should be confirmed with a follow-up  test. . For someone with known diabetes, a value <7% indicates  that their diabetes is well controlled and a value  greater than or equal to 7% indicates suboptimal  control. A1c targets should be individualized based on  duration of diabetes, age, comorbid conditions, and  other considerations. . Currently, no consensus exists regarding use of hemoglobin A1c for diagnosis of diabetes for children. .    Mean Plasma Glucose 240 mg/dL   eAG (mmol/L) 13.3 mmol/L  VITAMIN D 25 Hydroxy (Vit-D Deficiency, Fractures)     Status: None   Collection Time: 06/30/20 10:51 AM  Result Value Ref Range   Vit D, 25-Hydroxy 98 30 - 100 ng/mL    Comment: Vitamin D Status         25-OH Vitamin D: . Deficiency:                    <20 ng/mL Insufficiency:             20 - 29 ng/mL Optimal:                 > or = 30 ng/mL . For 25-OH Vitamin D testing on patients on  D2-supplementation and patients for whom quantitation  of D2 and D3 fractions is required, the Quest[employer]ureD(TM) 25-OH VIT D, (D2,D3), LC/MS/MS is recommended: order  code [id number] (patients >42yr). See Note 1 . Note 1 . For additional information, please refer to  http://education.[facility].com/faq/FAQ199  (This link is being provided for informational/ educational purposes only.)   COMPLETE METABOLIC PANEL WITH GFR     Status: None   Collection Time: 09/11/20 12:00 AM  Result Value Ref Range   Glucose, Bld 76 65 - 99 mg/dL    Comment: .            Fasting reference interval .    BUN 16 7 - 25 mg/dL   Creat 0.91 0.50 - 1.05 mg/dL   eGFR 70 > OR = 60 mL/min/1.760m   Comment: The eGFR is based on the CKD-EPI 2021 equation. To calculate  the new eGFR from a previous Creatinine or Cystatin C result, go to https://www.kidney.org/professionals/ kdoqi/gfr%5Fcalculator    BUN/Creatinine Ratio NOT APPLICABLE 6  - 22 (calc)   Sodium 138 135 - 146 mmol/L   Potassium 4.8 3.5 - 5.3 mmol/L   Chloride 102 98 - 110 mmol/L   CO2 29 20 - 32 mmol/L   Calcium 9.1 8.6 - 10.4 mg/dL   Total Protein 7.1 6.1 - 8.1 g/dL   Albumin 3.9 3.6 - 5.1 g/dL   Globulin 3.2 1.9 - 3.7 g/dL (calc)   AG Ratio 1.2 1.0 - 2.5 (calc)   Total Bilirubin 0.3 0.2 - 1.2 mg/dL   Alkaline phosphatase (APISO) 83 37 - 153 U/L   AST 28 10 - 35 U/L   ALT 23 6 - 29 U/L  CBC with Differential/Platelet     Status: None   Collection Time: 09/11/20 12:00 AM  Result Value Ref Range   WBC 5.0 3.8 - 10.8 Thousand/uL   RBC 4.07 3.80 - 5.10 Million/uL  Hemoglobin 11.7 11.7 - 15.5 g/dL   HCT 36.4 35.0 - 45.0 %   MCV 89.4 80.0 - 100.0 fL   MCH 28.7 27.0 - 33.0 pg   MCHC 32.1 32.0 - 36.0 g/dL   RDW 12.1 11.0 - 15.0 %   Platelets 226 140 - 400 Thousand/uL   MPV 10.6 7.5 - 12.5 fL   Neutro Abs 1,955 1,500 - 7,800 cells/uL   Lymphs Abs 2,415 850 - 3,900 cells/uL   Absolute Monocytes 480 200 - 950 cells/uL   Eosinophils Absolute 120 15 - 500 cells/uL   Basophils Absolute 30 0 - 200 cells/uL   Neutrophils Relative % 39.1 %   Total Lymphocyte 48.3 %   Monocytes Relative 9.6 %   Eosinophils Relative 2.4 %   Basophils Relative 0.6 %  Uric acid     Status: Abnormal   Collection Time: 09/11/20 12:00 AM  Result Value Ref Range   Uric Acid, Serum 9.3 (H) 2.5 - 7.0 mg/dL    Comment: Therapeutic target for gout patients: <6.0 mg/dL .   TSH + free T4     Status: None   Collection Time: 09/11/20 12:00 AM  Result Value Ref Range   TSH W/REFLEX TO FT4 0.71 0.40 - 4.50 mIU/L  POCT URINALYSIS DIP (CLINITEK)     Status: Abnormal   Collection Time: 09/11/20  2:55 PM  Result Value Ref Range   Color, UA yellow yellow   Clarity, UA clear clear   Glucose, UA =100 (A) negative mg/dL   Bilirubin, UA negative negative   Ketones, POC UA negative negative mg/dL   Spec Grav, UA 1.010 1.010 - 1.025   Blood, UA negative negative   pH, UA 5.5 5.0 - 8.0    POC PROTEIN,UA negative negative, trace   Urobilinogen, UA 0.2 0.2 or 1.0 E.U./dL   Nitrite, UA Negative Negative   Leukocytes, UA Trace (A) Negative  Urine Culture     Status: None   Collection Time: 09/11/20  2:58 PM   Specimen: Urine  Result Value Ref Range   MICRO NUMBER: [id number]    SPECIMEN QUALITY: Adequate    Sample Source NOT GIVEN    STATUS: FINAL    Result:      Mixed genital flora isolated. These superficial bacteria are not indicative of a urinary tract infection. No further organism identification is warranted on this specimen. If clinically indicated, recollect clean-catch, mid-stream urine and transfer  immediately to Urine Culture Transport Tube.     No results found.     All questions at time of visit were answered - patient instructed to contact office with any additional concerns or updates. ER/RTC precautions were reviewed with the patient as applicable.   Please note: manual typing as well as voice recognition software may have been used to produce this document - typos may escape review. Please contact Dr. Sheppard Coil for any needed clarifications.

## 2020-09-15 NOTE — Patient Instructions (Signed)
Plan: Starting gout treatment Colchicine for 3 months Allopurinol indefinitely Repeat labs in 4-6 weeks

## 2020-09-22 ENCOUNTER — Ambulatory Visit: Payer: BC Managed Care – PPO | Admitting: Osteopathic Medicine

## 2020-09-24 DIAGNOSIS — G250 Essential tremor: Secondary | ICD-10-CM | POA: Diagnosis not present

## 2020-09-24 DIAGNOSIS — M5441 Lumbago with sciatica, right side: Secondary | ICD-10-CM | POA: Diagnosis not present

## 2020-09-24 DIAGNOSIS — Z7409 Other reduced mobility: Secondary | ICD-10-CM | POA: Diagnosis not present

## 2020-09-24 DIAGNOSIS — G8929 Other chronic pain: Secondary | ICD-10-CM | POA: Diagnosis not present

## 2020-09-24 DIAGNOSIS — M545 Low back pain, unspecified: Secondary | ICD-10-CM | POA: Diagnosis not present

## 2020-09-24 DIAGNOSIS — M542 Cervicalgia: Secondary | ICD-10-CM | POA: Diagnosis not present

## 2020-09-24 DIAGNOSIS — R269 Unspecified abnormalities of gait and mobility: Secondary | ICD-10-CM | POA: Diagnosis not present

## 2020-10-01 DIAGNOSIS — R269 Unspecified abnormalities of gait and mobility: Secondary | ICD-10-CM | POA: Diagnosis not present

## 2020-10-01 DIAGNOSIS — Z7409 Other reduced mobility: Secondary | ICD-10-CM | POA: Diagnosis not present

## 2020-10-01 DIAGNOSIS — M542 Cervicalgia: Secondary | ICD-10-CM | POA: Diagnosis not present

## 2020-10-01 DIAGNOSIS — G250 Essential tremor: Secondary | ICD-10-CM | POA: Diagnosis not present

## 2020-10-01 DIAGNOSIS — M5441 Lumbago with sciatica, right side: Secondary | ICD-10-CM | POA: Diagnosis not present

## 2020-10-01 DIAGNOSIS — M545 Low back pain, unspecified: Secondary | ICD-10-CM | POA: Diagnosis not present

## 2020-10-01 DIAGNOSIS — G8929 Other chronic pain: Secondary | ICD-10-CM | POA: Diagnosis not present

## 2020-10-08 DIAGNOSIS — G250 Essential tremor: Secondary | ICD-10-CM | POA: Diagnosis not present

## 2020-10-08 DIAGNOSIS — Z7409 Other reduced mobility: Secondary | ICD-10-CM | POA: Diagnosis not present

## 2020-10-08 DIAGNOSIS — M5441 Lumbago with sciatica, right side: Secondary | ICD-10-CM | POA: Diagnosis not present

## 2020-10-08 DIAGNOSIS — G8929 Other chronic pain: Secondary | ICD-10-CM | POA: Diagnosis not present

## 2020-10-08 DIAGNOSIS — M545 Low back pain, unspecified: Secondary | ICD-10-CM | POA: Diagnosis not present

## 2020-10-08 DIAGNOSIS — R269 Unspecified abnormalities of gait and mobility: Secondary | ICD-10-CM | POA: Diagnosis not present

## 2020-10-08 DIAGNOSIS — M542 Cervicalgia: Secondary | ICD-10-CM | POA: Diagnosis not present

## 2020-10-15 DIAGNOSIS — M545 Low back pain, unspecified: Secondary | ICD-10-CM | POA: Diagnosis not present

## 2020-10-15 DIAGNOSIS — R269 Unspecified abnormalities of gait and mobility: Secondary | ICD-10-CM | POA: Diagnosis not present

## 2020-10-15 DIAGNOSIS — M5441 Lumbago with sciatica, right side: Secondary | ICD-10-CM | POA: Diagnosis not present

## 2020-10-15 DIAGNOSIS — G250 Essential tremor: Secondary | ICD-10-CM | POA: Diagnosis not present

## 2020-10-15 DIAGNOSIS — G8929 Other chronic pain: Secondary | ICD-10-CM | POA: Diagnosis not present

## 2020-10-15 DIAGNOSIS — M542 Cervicalgia: Secondary | ICD-10-CM | POA: Diagnosis not present

## 2020-10-15 DIAGNOSIS — Z7409 Other reduced mobility: Secondary | ICD-10-CM | POA: Diagnosis not present

## 2020-10-20 ENCOUNTER — Other Ambulatory Visit: Payer: BC Managed Care – PPO

## 2020-10-20 DIAGNOSIS — Z23 Encounter for immunization: Secondary | ICD-10-CM | POA: Diagnosis not present

## 2020-10-21 LAB — COMPLETE METABOLIC PANEL WITH GFR
AG Ratio: 1.4 (calc) (ref 1.0–2.5)
ALT: 26 U/L (ref 6–29)
AST: 39 U/L — ABNORMAL HIGH (ref 10–35)
Albumin: 3.7 g/dL (ref 3.6–5.1)
Alkaline phosphatase (APISO): 64 U/L (ref 37–153)
BUN: 18 mg/dL (ref 7–25)
CO2: 23 mmol/L (ref 20–32)
Calcium: 8.6 mg/dL (ref 8.6–10.4)
Chloride: 104 mmol/L (ref 98–110)
Creat: 1.05 mg/dL (ref 0.50–1.05)
Globulin: 2.7 g/dL (calc) (ref 1.9–3.7)
Glucose, Bld: 289 mg/dL — ABNORMAL HIGH (ref 65–139)
Potassium: 4.3 mmol/L (ref 3.5–5.3)
Sodium: 137 mmol/L (ref 135–146)
Total Bilirubin: 0.2 mg/dL (ref 0.2–1.2)
Total Protein: 6.4 g/dL (ref 6.1–8.1)
eGFR: 58 mL/min/{1.73_m2} — ABNORMAL LOW (ref >=60)

## 2020-10-21 LAB — URIC ACID: Uric Acid, Serum: 5.9 mg/dL (ref 2.5–7.0)

## 2020-10-22 DIAGNOSIS — R269 Unspecified abnormalities of gait and mobility: Secondary | ICD-10-CM | POA: Diagnosis not present

## 2020-10-22 DIAGNOSIS — M542 Cervicalgia: Secondary | ICD-10-CM | POA: Diagnosis not present

## 2020-10-22 DIAGNOSIS — M5441 Lumbago with sciatica, right side: Secondary | ICD-10-CM | POA: Diagnosis not present

## 2020-10-22 DIAGNOSIS — G250 Essential tremor: Secondary | ICD-10-CM | POA: Diagnosis not present

## 2020-10-22 DIAGNOSIS — Z7409 Other reduced mobility: Secondary | ICD-10-CM | POA: Diagnosis not present

## 2020-10-22 DIAGNOSIS — G8929 Other chronic pain: Secondary | ICD-10-CM | POA: Diagnosis not present

## 2020-10-26 ENCOUNTER — Other Ambulatory Visit: Payer: Self-pay | Admitting: Neurology

## 2020-10-26 DIAGNOSIS — G250 Essential tremor: Secondary | ICD-10-CM

## 2020-10-27 ENCOUNTER — Other Ambulatory Visit: Payer: Self-pay

## 2020-10-27 DIAGNOSIS — M549 Dorsalgia, unspecified: Secondary | ICD-10-CM | POA: Diagnosis not present

## 2020-10-27 DIAGNOSIS — M25561 Pain in right knee: Secondary | ICD-10-CM | POA: Diagnosis not present

## 2020-10-27 DIAGNOSIS — M797 Fibromyalgia: Secondary | ICD-10-CM | POA: Diagnosis not present

## 2020-10-27 DIAGNOSIS — G894 Chronic pain syndrome: Secondary | ICD-10-CM | POA: Diagnosis not present

## 2020-10-28 ENCOUNTER — Other Ambulatory Visit: Payer: Self-pay | Admitting: Osteopathic Medicine

## 2020-11-17 DIAGNOSIS — E119 Type 2 diabetes mellitus without complications: Secondary | ICD-10-CM | POA: Diagnosis not present

## 2020-11-17 DIAGNOSIS — E669 Obesity, unspecified: Secondary | ICD-10-CM | POA: Diagnosis not present

## 2020-11-17 DIAGNOSIS — Z79899 Other long term (current) drug therapy: Secondary | ICD-10-CM | POA: Diagnosis not present

## 2020-11-17 DIAGNOSIS — E11649 Type 2 diabetes mellitus with hypoglycemia without coma: Secondary | ICD-10-CM | POA: Diagnosis not present

## 2020-11-17 DIAGNOSIS — Z794 Long term (current) use of insulin: Secondary | ICD-10-CM | POA: Diagnosis not present

## 2020-11-17 DIAGNOSIS — Z6841 Body Mass Index (BMI) 40.0 and over, adult: Secondary | ICD-10-CM | POA: Diagnosis not present

## 2020-11-17 DIAGNOSIS — Z7984 Long term (current) use of oral hypoglycemic drugs: Secondary | ICD-10-CM | POA: Diagnosis not present

## 2020-12-03 ENCOUNTER — Other Ambulatory Visit: Payer: Self-pay | Admitting: Osteopathic Medicine

## 2020-12-05 ENCOUNTER — Other Ambulatory Visit: Payer: Self-pay | Admitting: Osteopathic Medicine

## 2021-01-19 NOTE — Progress Notes (Signed)
Assessment/Plan:    1.  Essential Tremor  -Patient is status post focused ultrasound in June, 2022 at [facility] in [place].  Has had loss of balance post surgery  -pt is supposed to be on primidone 50 mg, 3 po bid for the R  hand tremor.  She increased to 4 po bid.  Told her not to increase further  -discussed with patient that needs to use cane/walker at all times.  2.  OSAS  -Patient following with Dr. Maple Hudson for BiPAP  3.  Dementia  -This was demonstrated on neurocognitive testing with Dr. Alinda Dooms in March, 2019, along with severe anxiety and depression, for which she was receiving [facility].  Memory changes is very mild today and I am not convinced of the dx  -She is on several medications which could contribute to memory change.  She is following with pain management and on Lyrica, 150 mg twice per day.  4.  F/u 1 year.  Can f/u with PA  Subjective:   Dominique Russell was seen today in follow up for essential tremor.  My previous records were reviewed prior to todays visit.  This patient is accompanied in the office by her child who supplements the history.  Patient had focused ultrasound at [facility] since our last visit.  This was done August 17, 2020.  She had right-sided thalamotomy completed.  She called [place] at the end of June to state that they had told her to stop her primidone, but she wanted to start back on it.  I told her she really need to follow-up with the people who did her procedure, since they were now managing the tremor.  I do see that not long after that, she requested referral to movement at [facility] but never was seen there.  I also see that [facility] did request she f/u and don't see that was done after the initial f/u post procedure.   She did go to the hospital on July 2 (about a week after her ultrasound) with complaints of slurred speech and left arm and leg weakness.  She states that she was falling and still isn't back to normal.  She stopped  PT back in Oct.  She thinks that she is about 80% normal.   She states today that she went back on the primidone but went to 4 po bid (from 3 po bid).  She feels she needs it for the R hand.  Daughter states that head is no longer bobbing.     Current prescribed movement disorder medications: Primidone, 150 mg bid (pt reports she has gone up 200 mg bid)   PREVIOUS MEDICATIONS: Current/Previously tried tremor medications: primidone (states higher dosages caused memory change), topamax (personality change), gabapentin (was on it for neuropathy); lyrica; artane (pt stated didn't help - no SE and I never saw pt on it as d/c before came back); propranolol (pt felt made her worse); lamictal (placed on by psych but states it caused tremor)  ALLERGIES:  No Known Allergies  CURRENT MEDICATIONS:  Outpatient Encounter Medications as of 01/20/2021  Medication Sig   acetaminophen (TYLENOL) 650 MG CR tablet Take 650 mg by mouth every 8 (eight) hours as needed for pain.   B-D UF III MINI PEN NEEDLES 31G X 5 MM MISC USE AS DIRECTED WITH INSULIN AND VICTOZA.   Cholecalciferol (VITAMIN D-3) 25 MCG (1000 UT) CAPS Take 2 capsules (2,000 Units total) by mouth daily.   clobetasol ointment (  TEMOVATE) 0.05 % Apply 1 application topically 2 (two) times daily. To affected area(s) as needed, max 2-3 weeks to avoid whitening/thinning skin   colchicine 0.6 MG tablet TAKE 1 TABLET BY MOUTH EVERY DAY   diclofenac sodium (VOLTAREN) 1 % GEL Place onto the skin.   DULoxetine (CYMBALTA) 60 MG capsule TAKE 1 CAPSULE (60 MG TOTAL) BY MOUTH DAILY. MUST MAKE APPOINTMENT   folic acid (FOLVITE) 800 MCG tablet Take 400 mcg by mouth daily.   hydrochlorothiazide (HYDRODIURIL) 25 MG tablet Take 1 tablet (25 mg total) by mouth daily.   insulin aspart protamine- aspart (NOVOLOG MIX 70/30) (70-30) 100 UNIT/ML injection Inject into the skin.   ipratropium (ATROVENT) 0.06 % nasal spray Place 2 sprays into both nostrils 4 (four) times daily.    LYRICA 150 MG capsule Take 1 tablet by mouth 2 (two) times daily.   metFORMIN (GLUCOPHAGE-XR) 500 MG 24 hr tablet Take 500 mg by mouth every morning.   Omega-3 Fatty Acids (FISH OIL) 1000 MG CAPS Take 1 capsule by mouth daily.    ONE TOUCH ULTRA TEST test strip    primidone (MYSOLINE) 50 MG tablet TAKE 3 TABLETS (150 MG TOTAL) BY MOUTH 2 (TWO) TIMES DAILY AS NEEDED.   RADIANCE PLATINUM VITAMIN D3 125 MCG (5000 UT) TABS Take 5,000 Units by mouth once a week.   rosuvastatin (CRESTOR) 20 MG tablet Take 20 mg by mouth daily.   traZODone (DESYREL) 150 MG tablet TAKE 1 TABLET BY MOUTH AT BEDTIME.   valsartan (DIOVAN) 320 MG tablet TAKE 1 TABLET BY MOUTH EVERY DAY   vitamin B-12 (CYANOCOBALAMIN) 1000 MCG tablet Take 1,000 mcg by mouth daily.    [DISCONTINUED] allopurinol (ZYLOPRIM) 300 MG tablet Take 1 tablet (300 mg total) by mouth daily. (Patient not taking: Reported on 01/20/2021)   No facility-administered encounter medications on file as of 01/20/2021.     Objective:    PHYSICAL EXAMINATION:    VITALS:   There were no vitals filed for this visit.   GEN:  The patient appears stated age and is in NAD. HEENT:  Normocephalic, atraumatic.  The mucous membranes are moist.   Neurological examination:  Orientation: The patient is alert and oriented x3. Cranial nerves: There is good facial symmetry. The speech is fluent and clear. Soft palate rises symmetrically and there is no tongue deviation. Hearing is intact to conversational tone. Sensation: Sensation is intact to light touch throughout Motor: Strength is at least antigravity x4.  Movement examination: Tone: There is normal tone in the UE/LE Abnormal movements: didn't see much tremor on either side today, even when grabbing an object Gait:  arises slowly from couch.  Walks slow and tenuous but was fairly stead.  I have reviewed and interpreted the following labs independently   Chemistry      Component Value Date/Time   NA  137 10/20/2020 1011   K 4.3 10/20/2020 1011   CL 104 10/20/2020 1011   CO2 23 10/20/2020 1011   BUN 18 10/20/2020 1011   BUN 24 (A) 07/20/2018 0000   CREATININE 1.05 10/20/2020 1011   GLU 84 07/20/2018 0000      Component Value Date/Time   CALCIUM 8.6 10/20/2020 1011   ALKPHOS 52 07/08/2016 1020   AST 39 (H) 10/20/2020 1011   ALT 26 10/20/2020 1011   BILITOT 0.2 10/20/2020 1011      Lab Results  Component Value Date   WBC 5.0 09/11/2020   HGB 11.7 09/11/2020   HCT 36.4  09/11/2020   MCV 89.4 09/11/2020   PLT 226 09/11/2020   Lab Results  Component Value Date   TSH 0.77 06/30/2020     Chemistry      Component Value Date/Time   NA 137 10/20/2020 1011   K 4.3 10/20/2020 1011   CL 104 10/20/2020 1011   CO2 23 10/20/2020 1011   BUN 18 10/20/2020 1011   BUN 24 (A) 07/20/2018 0000   CREATININE 1.05 10/20/2020 1011   GLU 84 07/20/2018 0000      Component Value Date/Time   CALCIUM 8.6 10/20/2020 1011   ALKPHOS 52 07/08/2016 1020   AST 39 (H) 10/20/2020 1011   ALT 26 10/20/2020 1011   BILITOT 0.2 10/20/2020 1011         Total time spent on today's visit was 20 minutes, including both face-to-face time and nonface-to-face time.  Time included that spent on review of records (prior notes available to me/labs/imaging if pertinent), discussing treatment and goals, answering patient's questions and coordinating care.  Cc:  Sunnie Nielsen, DO

## 2021-01-20 ENCOUNTER — Telehealth: Payer: Self-pay | Admitting: Neurology

## 2021-01-20 ENCOUNTER — Telehealth (INDEPENDENT_AMBULATORY_CARE_PROVIDER_SITE_OTHER): Payer: BC Managed Care – PPO | Admitting: Neurology

## 2021-01-20 ENCOUNTER — Encounter: Payer: Self-pay | Admitting: Neurology

## 2021-01-20 ENCOUNTER — Other Ambulatory Visit: Payer: Self-pay

## 2021-01-20 DIAGNOSIS — G250 Essential tremor: Secondary | ICD-10-CM

## 2021-01-20 NOTE — Telephone Encounter (Signed)
Called patient back and was able to get her checked in

## 2021-01-20 NOTE — Telephone Encounter (Signed)
Pt's daughter called in returning a call to go over the patient's chart before the Vitual Visit today

## 2021-01-26 DIAGNOSIS — Z23 Encounter for immunization: Secondary | ICD-10-CM | POA: Diagnosis not present

## 2021-01-26 DIAGNOSIS — G609 Hereditary and idiopathic neuropathy, unspecified: Secondary | ICD-10-CM | POA: Diagnosis not present

## 2021-01-26 DIAGNOSIS — M797 Fibromyalgia: Secondary | ICD-10-CM | POA: Diagnosis not present

## 2021-01-26 DIAGNOSIS — M170 Bilateral primary osteoarthritis of knee: Secondary | ICD-10-CM | POA: Diagnosis not present

## 2021-01-26 DIAGNOSIS — M25561 Pain in right knee: Secondary | ICD-10-CM | POA: Diagnosis not present

## 2021-02-09 ENCOUNTER — Other Ambulatory Visit: Payer: Self-pay

## 2021-02-09 ENCOUNTER — Encounter: Payer: Self-pay | Admitting: Family Medicine

## 2021-02-09 ENCOUNTER — Ambulatory Visit (INDEPENDENT_AMBULATORY_CARE_PROVIDER_SITE_OTHER): Payer: BC Managed Care – PPO | Admitting: Family Medicine

## 2021-02-09 DIAGNOSIS — S022XXA Fracture of nasal bones, initial encounter for closed fracture: Secondary | ICD-10-CM

## 2021-02-09 DIAGNOSIS — M79605 Pain in left leg: Secondary | ICD-10-CM | POA: Diagnosis not present

## 2021-02-09 DIAGNOSIS — M79604 Pain in right leg: Secondary | ICD-10-CM

## 2021-02-09 MED ORDER — FLUTICASONE PROPIONATE 50 MCG/ACT NA SUSP: 2.0000 | Freq: Every day | NASAL | 6 refills | Status: DC

## 2021-02-09 NOTE — Patient Instructions (Addendum)
PT referral placed ENT referral placed
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
## 2021-02-16 ENCOUNTER — Other Ambulatory Visit: Payer: Self-pay | Admitting: Osteopathic Medicine

## 2021-02-16 DIAGNOSIS — F339 Major depressive disorder, recurrent, unspecified: Secondary | ICD-10-CM

## 2021-03-03 ENCOUNTER — Other Ambulatory Visit: Payer: Self-pay

## 2021-03-03 ENCOUNTER — Encounter: Payer: Self-pay | Admitting: Physical Therapy

## 2021-03-03 ENCOUNTER — Ambulatory Visit: Payer: BC Managed Care – PPO | Attending: Medical-Surgical | Admitting: Physical Therapy

## 2021-03-03 DIAGNOSIS — R29898 Other symptoms and signs involving the musculoskeletal system: Secondary | ICD-10-CM

## 2021-03-03 DIAGNOSIS — M6281 Muscle weakness (generalized): Secondary | ICD-10-CM | POA: Diagnosis not present

## 2021-03-03 DIAGNOSIS — M79605 Pain in left leg: Secondary | ICD-10-CM | POA: Diagnosis not present

## 2021-03-03 DIAGNOSIS — M79604 Pain in right leg: Secondary | ICD-10-CM | POA: Diagnosis not present

## 2021-03-03 DIAGNOSIS — R262 Difficulty in walking, not elsewhere classified: Secondary | ICD-10-CM | POA: Diagnosis not present

## 2021-03-03 NOTE — Patient Instructions (Signed)
Access Code: [email or web address] URL: [email or web address] Date: 03/03/2021 Prepared by: [employer]  Exercises Seated Hamstring Stretch - 1 x daily - 7 x weekly - 1 sets - 3 reps - 20-30 sec hold Seated Long Arc Quad - 1 x daily - 7 x weekly - 2 sets - 10 reps - 3-5 seconds hold Seated Hip Adduction Isometrics with Ball - 1 x daily - 7 x weekly - 1 sets - 10 reps - 3-5 seconds hold Seated Hip Abduction with Resistance - 1 x daily - 7 x weekly - 2 sets - 10 reps Standing Tandem Balance with Counter Support - 1 x daily - 7 x weekly - 1 sets - 5 reps - 20-30 seconds hold
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
## 2021-03-04 ENCOUNTER — Ambulatory Visit: Payer: BC Managed Care – PPO | Admitting: Physical Therapy

## 2021-03-08 ENCOUNTER — Ambulatory Visit: Payer: BC Managed Care – PPO | Admitting: Physical Therapy

## 2021-03-10 ENCOUNTER — Other Ambulatory Visit: Payer: Self-pay

## 2021-03-10 ENCOUNTER — Ambulatory Visit: Payer: BC Managed Care – PPO | Admitting: Physical Therapy

## 2021-03-10 DIAGNOSIS — R262 Difficulty in walking, not elsewhere classified: Secondary | ICD-10-CM | POA: Diagnosis not present

## 2021-03-10 DIAGNOSIS — R29898 Other symptoms and signs involving the musculoskeletal system: Secondary | ICD-10-CM

## 2021-03-10 DIAGNOSIS — M6281 Muscle weakness (generalized): Secondary | ICD-10-CM | POA: Diagnosis not present

## 2021-03-10 DIAGNOSIS — M79604 Pain in right leg: Secondary | ICD-10-CM | POA: Diagnosis not present

## 2021-03-10 DIAGNOSIS — M79605 Pain in left leg: Secondary | ICD-10-CM | POA: Diagnosis not present

## 2021-03-10 NOTE — Therapy (Signed)
[facility] [address] [place], [place], [address] Phone: [phone]   Fax:  [phone]  Physical Therapy Treatment  Patient Details  Name: Dominique Russell MRN: [id number] Date of Birth: [birth year] Referring Provider (PT): Larinda Buttery, joy   Encounter Date: 03/10/2021   PT End of Session - 03/10/21 1137     Visit Number 2    Number of Visits 12    Date for PT Re-Evaluation 04/14/21    PT Start Time 1100    PT Stop Time 1140    PT Time Calculation (min) 40 min    Activity Tolerance Patient tolerated treatment well    Behavior During Therapy [facility] for tasks assessed/performed             Past Medical History:  Diagnosis Date   Diabetes (HCC)    Essential tremor    Fibromyalgia    Hypertension     Past Surgical History:  Procedure Laterality Date   BREAST REDUCTION SURGERY      There were no vitals filed for this visit.   Subjective Assessment - 03/10/21 1104     Subjective Pt states "my knees hurt and I feel stiff". She states her back pain is not as bad as her knee pain    Patient Stated Goals decrease pain and "be able to do what I need to do"    Currently in Pain? Yes    Pain Score 8     Pain Location Knee    Pain Orientation Left;Right    Pain Descriptors / Indicators Tightness                [facility] PT Assessment - 03/10/21 0001       Assessment   Medical Diagnosis bilateral leg soreness    Referring Provider (PT) jessup, joy    Onset Date/Surgical Date 02/03/21    Next MD Visit 05/17/21      Strength   Right Knee Extension 3/5    Left Knee Extension 3/5                           [facility] Adult PT Treatment/Exercise - 03/10/21 0001       Knee/Hip Exercises: Stretches   Passive Hamstring Stretch Right;Left;2 reps;30 seconds    Passive Hamstring Stretch Limitations seated      Knee/Hip Exercises: Aerobic   Nustep L4 x 5 min for warm up      Knee/Hip Exercises:  Standing   Other Standing Knee Exercises tandem stance x 30sec bilat, standing with head turns up/down and laterally x 10 each with CGA    Other Standing Knee Exercises standing tap up to 2'' step x 10 bilat, standing with cone reaching all directions with CGA      Knee/Hip Exercises: Seated   Long Arc Quad Left;Right;10 reps    [employer] 10 reps    Clamshell with TheraBand Red   x12   Sit to Sand 5 reps;without UE support   2 x 5 without UE support from elevated table                         PT Long Term Goals - 03/03/21 1351       PT LONG TERM GOAL #1   Title Pt will be independent with HEP    Time 6    Period Weeks    Status New  Target Date 04/14/21      PT LONG TERM GOAL #2   Title Pt will improve 5x STS to <= 14 seconds to demo improved strength and balance    Time 6    Period Weeks    Status New    Target Date 04/14/21      PT LONG TERM GOAL #3   Title Pt will tolerate standing x 10 minutes with pain <= 2/10    Time 6    Period Weeks    Status New    Target Date 04/14/21      PT LONG TERM GOAL #4   Title Pt will improve LE strength to 4/5 bilat to improve standing and walking tolerance    Time 6    Period Weeks    Status New    Target Date 04/14/21                   Plan - 03/10/21 1137     Clinical Impression Statement Pt with good tolerance of balance exercises with cues for upright posture to reduce forward trunk flexion. Pt requires frequent rests due to decreased endurance but with good motivation to participate and improve    PT Next Visit Plan progress HEP. LE strength and balance training    PT Home Exercise Plan [email or web address]    Consulted and Agree with Plan of Care Patient             Patient will benefit from skilled therapeutic intervention in order to improve the following deficits and impairments:     Visit Diagnosis: Muscle weakness (generalized)  Difficulty in walking, not elsewhere classified  Other  symptoms and signs involving the musculoskeletal system     Problem List Patient Active Problem List   Diagnosis Date Noted   Hyperuricemia 09/11/2020   Arthralgia of right foot 09/11/2020   Peripheral edema 09/11/2020   DDD (degenerative disc disease), cervical 01/23/2020   Otitis media, left 09/04/2019   OSA (obstructive sleep apnea) 01/22/2019   Bone disorder 09/06/2018   Essential hypertension 03/17/2016   Psoriasis 03/17/2016   Long-term insulin use in type 2 diabetes ([place]) 02/09/2015   Morbid obesity with BMI of 45.0-49.9, adult ([place]) 05/16/2014   DM (diabetes mellitus) ([place]) 01/23/2013   Low back pain 01/23/2013   Shoulder pain 01/23/2013   Bilateral knee pain 12/26/2012   Bilateral shoulder pain 12/26/2012   Essential tremor 09/26/2012   Headache 09/26/2012   Benign essential tremor 09/26/2012   Fibromyalgia 03/02/2011   Generalized osteoarthritis of multiple sites 03/02/2011   Rheumatoid arthritis ([place]) 03/02/2011    Brittiany Wiehe, PT 03/10/2021, 11:39 AM  [facility] [place] [address] [place], [place], [address] Phone: [phone]   Fax:  [phone]  Name: Dominique Russell MRN: [id number] Date of Birth: 1955-04-11

## 2021-03-15 ENCOUNTER — Encounter: Payer: BC Managed Care – PPO | Admitting: Physical Therapy

## 2021-03-17 ENCOUNTER — Ambulatory Visit: Payer: BC Managed Care – PPO | Admitting: Physical Therapy

## 2021-03-17 ENCOUNTER — Other Ambulatory Visit: Payer: Self-pay

## 2021-03-17 ENCOUNTER — Encounter: Payer: BC Managed Care – PPO | Admitting: Physical Therapy

## 2021-03-17 DIAGNOSIS — M5441 Lumbago with sciatica, right side: Secondary | ICD-10-CM

## 2021-03-17 DIAGNOSIS — M79604 Pain in right leg: Secondary | ICD-10-CM | POA: Diagnosis not present

## 2021-03-17 DIAGNOSIS — R262 Difficulty in walking, not elsewhere classified: Secondary | ICD-10-CM | POA: Diagnosis not present

## 2021-03-17 DIAGNOSIS — M6281 Muscle weakness (generalized): Secondary | ICD-10-CM | POA: Diagnosis not present

## 2021-03-17 DIAGNOSIS — R293 Abnormal posture: Secondary | ICD-10-CM

## 2021-03-17 DIAGNOSIS — M79605 Pain in left leg: Secondary | ICD-10-CM | POA: Diagnosis not present

## 2021-03-17 DIAGNOSIS — R29898 Other symptoms and signs involving the musculoskeletal system: Secondary | ICD-10-CM

## 2021-03-17 NOTE — Therapy (Signed)
[facility] [address] [place], [place], [address] Phone: [phone]   Fax:  [phone]  Physical Therapy Treatment  Patient Details  Name: Dominique Russell MRN: [id number] Date of Birth: [birth year] Referring Provider (PT): Larinda Butteryjessup, joy   Encounter Date: 03/17/2021   PT End of Session - 03/17/21 1429     Visit Number 3    Number of Visits 12    Date for PT Re-Evaluation 04/14/21    PT Start Time 1345    PT Stop Time 1429    PT Time Calculation (min) 44 min    Activity Tolerance Patient tolerated treatment well    Behavior During Therapy [facility] for tasks assessed/performed             Past Medical History:  Diagnosis Date   Diabetes (HCC)    Essential tremor    Fibromyalgia    Hypertension     Past Surgical History:  Procedure Laterality Date   BREAST REDUCTION SURGERY      There were no vitals filed for this visit.   Subjective Assessment - 03/17/21 1346     Subjective "I'm feeling sore because of this weather"    Patient Stated Goals decrease pain and "be able to do what I need to do"    Currently in Pain? Yes    Pain Score 9     Pain Location Knee    Pain Orientation Right;Left    Pain Descriptors / Indicators Tightness                [facility] PT Assessment - 03/17/21 0001       Assessment   Medical Diagnosis bilateral leg soreness    Referring Provider (PT) jessup, joy    Onset Date/Surgical Date 02/03/21    Next MD Visit 05/17/21      Ambulation/Gait   Gait velocity decreased                           [facility] Adult PT Treatment/Exercise - 03/17/21 0001       Knee/Hip Exercises: Stretches   Passive Hamstring Stretch Right;Left;2 reps;30 seconds    Passive Hamstring Stretch Limitations seated    Other Knee/Hip Stretches standing trunk extension x 10 at elevated table      Knee/Hip Exercises: Aerobic   Nustep L5 x 5 min for warm up      Knee/Hip Exercises:  Standing   Rocker Board Limitations taps x 10 each laterally and A/P, then 30 sec hovers each way wiht 1 UE support    Gait Training farmers carry 5# KB x 1 lap each hand    Other Standing Knee Exercises tandem stance x 30sec bilat, standing with head turns up/down and laterally x 10 each with CGA    Other Standing Knee Exercises standing tap up to 2'' step x 10 bilat, standing with cone reaching all directions with CGA      Knee/Hip Exercises: Seated   Long Arc Quad Strengthening;Left;Right;10 reps;Weights    Long Arc Quad Weight 2 lbs.    Ball Squeeze 10 reps    Clamshell with TheraBand Red   15   Sit to Sand 5 reps;without UE support;2 sets                          PT Long Term Goals - 03/03/21 1351       PT LONG TERM  GOAL #1   Title Pt will be independent with HEP    Time 6    Period Weeks    Status New    Target Date 04/14/21      PT LONG TERM GOAL #2   Title Pt will improve 5x STS to <= 14 seconds to demo improved strength and balance    Time 6    Period Weeks    Status New    Target Date 04/14/21      PT LONG TERM GOAL #3   Title Pt will tolerate standing x 10 minutes with pain <= 2/10    Time 6    Period Weeks    Status New    Target Date 04/14/21      PT LONG TERM GOAL #4   Title Pt will improve LE strength to 4/5 bilat to improve standing and walking tolerance    Time 6    Period Weeks    Status New    Target Date 04/14/21                   Plan - 03/17/21 1430     Clinical Impression Statement Pt able to tolerate addition of rocker board and farmers carry without increase in symptoms. Decrease in symptoms with farmers carry and standing back extension    PT Next Visit Plan progress HEP. LE strength and balance training    PT Home Exercise Plan [email or web address]    Consulted and Agree with Plan of Care Patient             Patient will benefit from skilled therapeutic intervention in order to improve the following deficits and  impairments:     Visit Diagnosis: Muscle weakness (generalized)  Difficulty in walking, not elsewhere classified  Other symptoms and signs involving the musculoskeletal system  Acute midline low back pain with right-sided sciatica  Abnormal posture     Problem List Patient Active Problem List   Diagnosis Date Noted   Hyperuricemia 09/11/2020   Arthralgia of right foot 09/11/2020   Peripheral edema 09/11/2020   DDD (degenerative disc disease), cervical 01/23/2020   Otitis media, left 09/04/2019   OSA (obstructive sleep apnea) 01/22/2019   Bone disorder 09/06/2018   Essential hypertension 03/17/2016   Psoriasis 03/17/2016   Long-term insulin use in type 2 diabetes (HCC) 02/09/2015   Morbid obesity with BMI of 45.0-49.9, adult (HCC) 05/16/2014   DM (diabetes mellitus) (HCC) 01/23/2013   Low back pain 01/23/2013   Shoulder pain 01/23/2013   Bilateral knee pain 12/26/2012   Bilateral shoulder pain 12/26/2012   Essential tremor 09/26/2012   Headache 09/26/2012   Benign essential tremor 09/26/2012   Fibromyalgia 03/02/2011   Generalized osteoarthritis of multiple sites 03/02/2011   Rheumatoid arthritis (HCC) 03/02/2011    Joellyn Grandt, PT 03/17/2021, 2:34 PM  [facility] [place] [address] [place] [address] [place], [place], [address] Phone: [phone]   Fax:  [phone]  Name: Dominique Russell MRN: [id number] Date of Birth: 1955/03/05

## 2021-03-18 ENCOUNTER — Other Ambulatory Visit: Payer: Self-pay | Admitting: Family Medicine

## 2021-03-18 ENCOUNTER — Other Ambulatory Visit: Payer: Self-pay | Admitting: Osteopathic Medicine

## 2021-03-18 DIAGNOSIS — I10 Essential (primary) hypertension: Secondary | ICD-10-CM

## 2021-03-18 DIAGNOSIS — H6121 Impacted cerumen, right ear: Secondary | ICD-10-CM | POA: Diagnosis not present

## 2021-03-18 DIAGNOSIS — S0285XA Fracture of orbit, unspecified, initial encounter for closed fracture: Secondary | ICD-10-CM | POA: Diagnosis not present

## 2021-03-23 DIAGNOSIS — Z6841 Body Mass Index (BMI) 40.0 and over, adult: Secondary | ICD-10-CM | POA: Diagnosis not present

## 2021-03-23 DIAGNOSIS — E1142 Type 2 diabetes mellitus with diabetic polyneuropathy: Secondary | ICD-10-CM | POA: Diagnosis not present

## 2021-03-23 DIAGNOSIS — Z794 Long term (current) use of insulin: Secondary | ICD-10-CM | POA: Diagnosis not present

## 2021-03-23 DIAGNOSIS — E119 Type 2 diabetes mellitus without complications: Secondary | ICD-10-CM | POA: Diagnosis not present

## 2021-03-24 ENCOUNTER — Other Ambulatory Visit: Payer: Self-pay

## 2021-03-24 ENCOUNTER — Other Ambulatory Visit: Payer: Self-pay | Admitting: Osteopathic Medicine

## 2021-03-24 ENCOUNTER — Ambulatory Visit: Payer: BC Managed Care – PPO | Attending: Medical-Surgical | Admitting: Physical Therapy

## 2021-03-24 DIAGNOSIS — M6281 Muscle weakness (generalized): Secondary | ICD-10-CM | POA: Insufficient documentation

## 2021-03-24 DIAGNOSIS — R29898 Other symptoms and signs involving the musculoskeletal system: Secondary | ICD-10-CM | POA: Insufficient documentation

## 2021-03-24 DIAGNOSIS — R262 Difficulty in walking, not elsewhere classified: Secondary | ICD-10-CM | POA: Diagnosis not present

## 2021-03-24 NOTE — Therapy (Signed)
[facility] [address] [place] [address] [place], [place], [address] Phone: [phone]   Fax:  [phone]  Physical Therapy Treatment  Patient Details  Name: Dominique Russell MRN: [id number] Date of Birth: 10/10/1955 Referring Provider (PT): Larinda Butteryjessup, joy   Encounter Date: 03/24/2021   PT End of Session - 03/24/21 1428     Visit Number 4    Number of Visits 12    Date for PT Re-Evaluation 04/14/21    Authorization Type BCBS    PT Start Time 1345    PT Stop Time 1428    PT Time Calculation (min) 43 min    Activity Tolerance Patient tolerated treatment well    Behavior During Therapy [facility] for tasks assessed/performed             Past Medical History:  Diagnosis Date   Diabetes (HCC)    Essential tremor    Fibromyalgia    Hypertension     Past Surgical History:  Procedure Laterality Date   BREAST REDUCTION SURGERY      There were no vitals filed for this visit.   Subjective Assessment - 03/24/21 1347     Subjective Pt states "my knees are killing me today. this weather is terrible" Pt states she is feeling more "stiff" due to the cold and rainy weather    Patient Stated Goals decrease pain and "be able to do what I need to do"    Pain Score 9     Pain Location Knee    Pain Orientation Left;Right    Pain Descriptors / Indicators Tightness                [facility] PT Assessment - 03/24/21 0001       Assessment   Medical Diagnosis bilateral leg soreness    Referring Provider (PT) jessup, joy    Onset Date/Surgical Date 02/03/21    Next MD Visit 05/17/21      Strength   Right Knee Extension 3/5    Left Knee Extension 3/5                           [facility] Adult PT Treatment/Exercise - 03/24/21 0001       Knee/Hip Exercises: Stretches   Passive Hamstring Stretch Right;Left;2 reps;30 seconds    Passive Hamstring Stretch Limitations seated    Other Knee/Hip Stretches standing trunk extension x 10  at elevated table      Knee/Hip Exercises: Aerobic   Nustep L5 x 5 min for warm up      Knee/Hip Exercises: Standing   Rocker Board Limitations taps x 10 each laterally and A/P, then 30 sec hovers each way wiht 1 UE support    Gait Training farmers carry 5# KB x 1 lap each hand    Other Standing Knee Exercises standing on foam with head turns with min A      Knee/Hip Exercises: Seated   Long Arc Quad Strengthening;Right;Left;10 reps    Long Arc Quad Weight 2 lbs.    Ball Squeeze 12 reps    Clamshell with [employer]raBand Red   15                    PT Education - 03/24/21 1428     Education Details updated HEP    Person(s) Educated Patient    Methods Explanation;Demonstration;Handout    Comprehension Returned demonstration;Verbalized understanding  PT Long Term Goals - 03/03/21 1351       PT LONG TERM GOAL #1   Title Pt will be independent with HEP    Time 6    Period Weeks    Status New    Target Date 04/14/21      PT LONG TERM GOAL #2   Title Pt will improve 5x STS to <= 14 seconds to demo improved strength and balance    Time 6    Period Weeks    Status New    Target Date 04/14/21      PT LONG TERM GOAL #3   Title Pt will tolerate standing x 10 minutes with pain <= 2/10    Time 6    Period Weeks    Status New    Target Date 04/14/21      PT LONG TERM GOAL #4   Title Pt will improve LE strength to 4/5 bilat to improve standing and walking tolerance    Time 6    Period Weeks    Status New    Target Date 04/14/21                   Plan - 03/24/21 1428     Clinical Impression Statement Pt with increased stiffness at the beginning of this session. Improved gait and decreased pain by end of session. PT educated pt on importance of exercise to reduce pain and stiffness at home. HEP updated to progress LE strength and balance    PT Next Visit Plan LE strength and balance    PT Home Exercise Plan [email or web address]    Consulted and  Agree with Plan of Care Patient             Patient will benefit from skilled therapeutic intervention in order to improve the following deficits and impairments:     Visit Diagnosis: Muscle weakness (generalized)  Difficulty in walking, not elsewhere classified  Other symptoms and signs involving the musculoskeletal system     Problem List Patient Active Problem List   Diagnosis Date Noted   Hyperuricemia 09/11/2020   Arthralgia of right foot 09/11/2020   Peripheral edema 09/11/2020   DDD (degenerative disc disease), cervical 01/23/2020   Otitis media, left 09/04/2019   OSA (obstructive sleep apnea) 01/22/2019   Bone disorder 09/06/2018   Essential hypertension 03/17/2016   Psoriasis 03/17/2016   Long-term insulin use in type 2 diabetes (HCC) 02/09/2015   Morbid obesity with BMI of 45.0-49.9, adult (HCC) 05/16/2014   DM (diabetes mellitus) (HCC) 01/23/2013   Low back pain 01/23/2013   Shoulder pain 01/23/2013   Bilateral knee pain 12/26/2012   Bilateral shoulder pain 12/26/2012   Essential tremor 09/26/2012   Headache 09/26/2012   Benign essential tremor 09/26/2012   Fibromyalgia 03/02/2011   Generalized osteoarthritis of multiple sites 03/02/2011   Rheumatoid arthritis (HCC) 03/02/2011    Amyra Vantuyl, PT 03/24/2021, 2:30 PM  [facility] [address] [place] [address] [place], [place], [address] Phone: [phone]   Fax:  [phone]  Name: Dominique Russell MRN: [id number] Date of Birth: April 18, 1955

## 2021-03-24 NOTE — Patient Instructions (Signed)
Access Code: [email or web address] URL: [email or web address] Date: 03/24/2021 Prepared by: Reggy EyeKaren Tirzah Fross  Exercises Seated Hamstring Stretch - 1 x daily - 7 x weekly - 1 sets - 3 reps - 20-30 sec hold Seated Long Arc Quad - 1 x daily - 7 x weekly - 2 sets - 10 reps - 3-5 seconds hold Seated Hip Adduction Isometrics with Ball - 1 x daily - 7 x weekly - 1 sets - 10 reps - 3-5 seconds hold Seated Hip Abduction with Resistance - 1 x daily - 7 x weekly - 2 sets - 10 reps Standing Tandem Balance with Counter Support - 1 x daily - 7 x weekly - 1 sets - 5 reps - 20-30 seconds hold Sit to Stand - 1 x daily - 7 x weekly - 2 sets - 5 reps Standing with Head Rotation - 1 x daily - 7 x weekly - 3 sets - 10 reps

## 2021-03-24 NOTE — Telephone Encounter (Signed)
Routing to covering provider. Rx not listed in active med list.  °

## 2021-03-26 ENCOUNTER — Ambulatory Visit: Payer: BC Managed Care – PPO | Admitting: Physical Therapy

## 2021-03-26 ENCOUNTER — Other Ambulatory Visit: Payer: Self-pay

## 2021-03-26 ENCOUNTER — Encounter: Payer: Self-pay | Admitting: Physical Therapy

## 2021-03-26 ENCOUNTER — Ambulatory Visit (INDEPENDENT_AMBULATORY_CARE_PROVIDER_SITE_OTHER): Payer: BC Managed Care – PPO | Admitting: Physician Assistant

## 2021-03-26 DIAGNOSIS — Z0000 Encounter for general adult medical examination without abnormal findings: Secondary | ICD-10-CM

## 2021-03-26 DIAGNOSIS — R29898 Other symptoms and signs involving the musculoskeletal system: Secondary | ICD-10-CM | POA: Diagnosis not present

## 2021-03-26 DIAGNOSIS — R262 Difficulty in walking, not elsewhere classified: Secondary | ICD-10-CM

## 2021-03-26 DIAGNOSIS — M6281 Muscle weakness (generalized): Secondary | ICD-10-CM

## 2021-03-26 NOTE — Therapy (Signed)
[facility] [place] [address] [place] [address] [place], [place], [address] Phone: [phone]   Fax:  [phone]  Physical Therapy Treatment  Patient Details  Name: Dominique Russell MRN: [id number] Date of Birth: January 10, 1956 Referring Provider (PT): Larinda Buttery, joy   Encounter Date: 03/26/2021   PT End of Session - 03/26/21 1315     Visit Number 5    Number of Visits 12    Date for PT Re-Evaluation 04/14/21    Authorization Type BCBS    PT Start Time 1315    PT Stop Time 1355    PT Time Calculation (min) 40 min    Activity Tolerance Patient tolerated treatment well    Behavior During Therapy [facility] for tasks assessed/performed             Past Medical History:  Diagnosis Date   Diabetes (HCC)    Essential tremor    Fibromyalgia    Hypertension     Past Surgical History:  Procedure Laterality Date   BREAST REDUCTION SURGERY      There were no vitals filed for this visit.   Subjective Assessment - 03/26/21 1322     Subjective Pt reports she feels better when she does her exercises.  "My knees get better" "And I'm not as wobbly as I was".    Currently in Pain? Yes    Pain Score 7     Pain Location Knee    Pain Orientation Right;Left    Pain Descriptors / Indicators Constant;Aching    Aggravating Factors  prolonged sitting or walking    Pain Relieving Factors exercises                [facility] PT Assessment - 03/26/21 0001       Assessment   Medical Diagnosis bilateral leg soreness    Referring Provider (PT) jessup, joy    Onset Date/Surgical Date 02/03/21    Next MD Visit 05/17/21      Transfers   Five time sit to stand comments  22.91 seconds               [facility] Adult PT Treatment/Exercise - 03/26/21 0001       Knee/Hip Exercises: Stretches   Passive Hamstring Stretch Right;Left;2 reps;20 seconds    Passive Hamstring Stretch Limitations seated      Knee/Hip Exercises: Aerobic   Nustep L4: 6 min for warm up  (arms/egs)      Knee/Hip Exercises: Standing   Gait Training side stepping 15 ft R/L x 2 with light HHA (cues to pick up feet) ; Farmers carry 5# KB x 1 lap each hand (SBA)    Other Standing Knee Exercises --      Knee/Hip Exercises: Seated   Long Arc Quad Strengthening;Right;Left;10 reps, 2 sets  Then seated marching with 2# on ankle x 10    Long Arc Quad Weight 2 lbs.    Clamshell with TheraBand Red   15   Sit to Sand 5 reps;without UE support;1 set             Balance Exercises - 03/26/21 0001       Balance Exercises: Standing   Standing Eyes Opened Head turns;Foam/compliant surface;2 reps;30 secs   SBA for safety; without UE assist.   Stepping Strategy Lateral;Foam/compliant surface;5 reps   1 hand on rail as needed                    PT Long Term  Goals - 03/26/21 1337       PT LONG TERM GOAL #1   Title Pt will be independent with HEP    Time 6    Period Weeks    Status On-going    Target Date 04/14/21      PT LONG TERM GOAL #2   Title Pt will improve 5x STS to <= 14 seconds to demo improved strength and balance    Time 6    Period Weeks    Status On-going    Target Date 04/14/21      PT LONG TERM GOAL #3   Title Pt will tolerate standing x 10 minutes with pain <= 2/10    Time 6    Period Weeks    Status On-going    Target Date 04/14/21      PT LONG TERM GOAL #4   Title Pt will improve LE strength to 4/5 bilat to improve standing and walking tolerance    Time 6    Period Weeks    Status On-going    Target Date 04/14/21                   Plan - 03/26/21 1332     Clinical Impression Statement Pt's 5x STS time increased since eval, however pt reporting overall improvement in mobility.  She was able to tolerate increased reps of strengtheing exercises without increase in pain. Continues to report decreased pain at end of session with observed improved gait quality. Progressing gradually towards goals.    PT Next Visit Plan LE  strength and balance    PT Home Exercise Plan [email or web address]    Consulted and Agree with Plan of Care Patient             Patient will benefit from skilled therapeutic intervention in order to improve the following deficits and impairments:     Visit Diagnosis: Muscle weakness (generalized)  Difficulty in walking, not elsewhere classified  Other symptoms and signs involving the musculoskeletal system     Problem List Patient Active Problem List   Diagnosis Date Noted   Hyperuricemia 09/11/2020   Arthralgia of right foot 09/11/2020   Peripheral edema 09/11/2020   DDD (degenerative disc disease), cervical 01/23/2020   Otitis media, left 09/04/2019   OSA (obstructive sleep apnea) 01/22/2019   Bone disorder 09/06/2018   Essential hypertension 03/17/2016   Psoriasis 03/17/2016   Long-term insulin use in type 2 diabetes (HCC) 02/09/2015   Morbid obesity with BMI of 45.0-49.9, adult (HCC) 05/16/2014   DM (diabetes mellitus) (HCC) 01/23/2013   Low back pain 01/23/2013   Shoulder pain 01/23/2013   Bilateral knee pain 12/26/2012   Bilateral shoulder pain 12/26/2012   Essential tremor 09/26/2012   Headache 09/26/2012   Benign essential tremor 09/26/2012   Fibromyalgia 03/02/2011   Generalized osteoarthritis of multiple sites 03/02/2011   Rheumatoid arthritis (HCC) 03/02/2011   Mayer Camel, PTA 03/26/21 1:59 PM  [facility] [place] [address] [place] [address] [place], [place], [address] Phone: [phone]   Fax:  [phone]  Name: Dominique Russell MRN: [id number] Date of Birth: 1955-04-29

## 2021-03-26 NOTE — Patient Instructions (Addendum)
MEDICARE ANNUAL WELLNESS VISIT Health Maintenance Summary and Written Plan of Care  Dominique Russell ,  Thank you for allowing me to perform your Medicare Annual Wellness Visit and for your ongoing commitment to your health.   Health Maintenance & Immunization History Health Maintenance  Topic Date Due   OPHTHALMOLOGY EXAM  03/26/2021 (Originally 08/27/2019)   FOOT EXAM  04/20/2021 (Originally 05/20/2020)   MAMMOGRAM  08/05/2021   Pneumonia Vaccine 11+ Years old (4 - PPSV23 if available, else PCV20) 09/14/2021   COLONOSCOPY (Pts 45-54yrs Insurance coverage will need to be confirmed)  02/22/2024   TETANUS/TDAP  09/15/2026   DEXA SCAN  06/25/2029   INFLUENZA VACCINE  Completed   COVID-19 Vaccine  Completed   Hepatitis C Screening  Completed   Zoster Vaccines- Shingrix  Completed   HPV VACCINES  Aged Out   HEMOGLOBIN A1C  Discontinued   Immunization History  Administered Date(s) Administered   Fluad Quad(high Dose 65+) 11/20/2018   H1N1 01/24/2008   Influenza Split 11/27/2007, 11/14/2008, 12/02/2014   Influenza,inj,Quad PF,6+ Mos 11/23/2012, 10/26/2016, 12/27/2017, 11/25/2019   Influenza,inj,quad, With Preservative 01/14/2014   Influenza-Unspecified 12/13/2010, 11/01/2011, 11/23/2012, 11/22/2015, 12/17/2015, 01/26/2021   Moderna Covid-19 Vaccine Bivalent Booster 52yrs & up 01/26/2021   PFIZER(Purple Top)SARS-COV-2 Vaccination 06/10/2019, 07/01/2019, 01/23/2020   Pneumococcal Conjugate-13 05/21/2019   Pneumococcal Polysaccharide-23 03/29/2011, 09/14/2016   Tdap 09/14/2016   Zoster Recombinat (Shingrix) 08/01/2018, 10/23/2018   Zoster, Live 01/14/2014    These are the patient goals that we discussed:  Goals Addressed               This Visit's Progress     Patient Stated (pt-stated)        03/26/2021 AWV Goal: Exercise for General Health  Patient will verbalize understanding of the benefits of increased physical activity: Exercising regularly is important. It will improve  your overall fitness, flexibility, and endurance. Regular exercise also will improve your overall health. It can help you control your weight, reduce stress, and improve your bone density. Over the next year, patient will increase physical activity as tolerated with a goal of at least 150 minutes of moderate physical activity per week.  You can tell that you are exercising at a moderate intensity if your heart starts beating faster and you start breathing faster but can still hold a conversation. Moderate-intensity exercise ideas include: Walking 1 mile (1.6 km) in about 15 minutes Biking Hiking Golfing Dancing Water aerobics Patient will verbalize understanding of everyday activities that increase physical activity by providing examples like the following: Yard work, such as: Insurance underwriter Gardening Washing windows or floors Patient will be able to explain general safety guidelines for exercising:  Before you start a new exercise program, talk with your health care provider. Do not exercise so much that you hurt yourself, feel dizzy, or get very short of breath. Wear comfortable clothes and wear shoes with good support. Drink plenty of water while you exercise to prevent dehydration or heat stroke. Work out until your breathing and your heartbeat get faster.          This is a list of Health Maintenance Items that are overdue or due now: Eye exam  Foot exam Mammogram- Scheduled at [facility] sometime in February.    Orders/Referrals Placed Today: No orders of the defined types were placed in this encounter.  (Contact our referral department at [phone] if you have not spoken with  someone about your referral appointment within the next 5 days)    Follow-up Plan Follow-up with Dominique ButterJoy Jessup, NP as planned Schedule your eye exam.  Foot exam can be done at your next in-office visit. Medicare  wellness visit in one year. AVS printed and mailed to the patient.      Health Maintenance, Female Adopting a healthy lifestyle and getting preventive care are important in promoting health and wellness. Ask your health care provider about: The right schedule for you to have regular tests and exams. Things you can do on your own to prevent diseases and keep yourself healthy. What should I know about diet, weight, and exercise? Eat a healthy diet  Eat a diet that includes plenty of vegetables, fruits, low-fat dairy products, and lean protein. Do not eat a lot of foods that are high in solid fats, added sugars, or sodium. Maintain a healthy weight Body mass index (BMI) is used to identify weight problems. It estimates body fat based on height and weight. Your health care provider can help determine your BMI and help you achieve or maintain a healthy weight. Get regular exercise Get regular exercise. This is one of the most important things you can do for your health. Most adults should: Exercise for at least 150 minutes each week. The exercise should increase your heart rate and make you sweat (moderate-intensity exercise). Do strengthening exercises at least twice a week. This is in addition to the moderate-intensity exercise. Spend less time sitting. Even light physical activity can be beneficial. Watch cholesterol and blood lipids Have your blood tested for lipids and cholesterol at 68 years of age, then have this test every 5 years. Have your cholesterol levels checked more often if: Your lipid or cholesterol levels are high. You are older than 68 years of age. You are at high risk for heart disease. What should I know about cancer screening? Depending on your health history and family history, you may need to have cancer screening at various ages. This may include screening for: Breast cancer. Cervical cancer. Colorectal cancer. Skin cancer. Lung cancer. What should I know about  heart disease, diabetes, and high blood pressure? Blood pressure and heart disease High blood pressure causes heart disease and increases the risk of stroke. This is more likely to develop in people who have high blood pressure readings or are overweight. Have your blood pressure checked: Every 3-5 years if you are 7518-439 years of age. Every year if you are 10260 years old or older. Diabetes Have regular diabetes screenings. This checks your fasting blood sugar level. Have the screening done: Once every three years after age 68 if you are at a normal weight and have a low risk for diabetes. More often and at a younger age if you are overweight or have a high risk for diabetes. What should I know about preventing infection? Hepatitis B If you have a higher risk for hepatitis B, you should be screened for this virus. Talk with your health care provider to find out if you are at risk for hepatitis B infection. Hepatitis C Testing is recommended for: Everyone born from [birth year] through 1967. Anyone with known risk factors for hepatitis C. Sexually transmitted infections (STIs) Get screened for STIs, including gonorrhea and chlamydia, if: You are sexually active and are younger than 68 years of age. You are older than 68 years of age and your health care provider tells you that you are at risk for this type of infection. Your  sexual activity has changed since you were last screened, and you are at increased risk for chlamydia or gonorrhea. Ask your health care provider if you are at risk. Ask your health care provider about whether you are at high risk for HIV. Your health care provider may recommend a prescription medicine to help prevent HIV infection. If you choose to take medicine to prevent HIV, you should first get tested for HIV. You should then be tested every 3 months for as long as you are taking the medicine. Pregnancy If you are about to stop having your period (premenopausal) and you may  become pregnant, seek counseling before you get pregnant. Take 400 to 800 micrograms (mcg) of folic acid every day if you become pregnant. Ask for birth control (contraception) if you want to prevent pregnancy. Osteoporosis and menopause Osteoporosis is a disease in which the bones lose minerals and strength with aging. This can result in bone fractures. If you are 59 years old or older, or if you are at risk for osteoporosis and fractures, ask your health care provider if you should: Be screened for bone loss. Take a calcium or vitamin D supplement to lower your risk of fractures. Be given hormone replacement therapy (HRT) to treat symptoms of menopause. Follow these instructions at home: Alcohol use Do not drink alcohol if: Your health care provider tells you not to drink. You are pregnant, may be pregnant, or are planning to become pregnant. If you drink alcohol: Limit how much you have to: 0-1 drink a day. Know how much alcohol is in your drink. In the [place] one drink equals one 12 oz bottle of beer (355 mL), one 5 oz glass of wine (148 mL), or one 1 oz glass of hard liquor (44 mL). Lifestyle Do not use any products that contain nicotine or tobacco. These products include cigarettes, chewing tobacco, and vaping devices, such as e-cigarettes. If you need help quitting, ask your health care provider. Do not use street drugs. Do not share needles. Ask your health care provider for help if you need support or information about quitting drugs. General instructions Schedule regular health, dental, and eye exams. Stay current with your vaccines. Tell your health care provider if: You often feel depressed. You have ever been abused or do not feel safe at home. Summary Adopting a healthy lifestyle and getting preventive care are important in promoting health and wellness. Follow your health care provider's instructions about healthy diet, exercising, and getting tested or screened for  diseases. Follow your health care provider's instructions on monitoring your cholesterol and blood pressure. This information is not intended to replace advice given to you by your health care provider. Make sure you discuss any questions you have with your health care provider. Document Revised: 06/29/2020 Document Reviewed: 06/29/2020 Elsevier Patient Education  2022 [employer].

## 2021-03-26 NOTE — Progress Notes (Signed)
MEDICARE ANNUAL WELLNESS VISIT  03/26/2021  Telephone Visit Disclaimer This Medicare AWV was conducted by telephone due to national recommendations for restrictions regarding the COVID-19 Pandemic (e.g. social distancing).  I verified, using two identifiers, that I am speaking with Dominique Russell or their authorized healthcare agent. I discussed the limitations, risks, security, and privacy concerns of performing an evaluation and management service by telephone and the potential availability of an in-person appointment in the future. The patient expressed understanding and agreed to proceed.  Location of Patient: Home Location of Provider (nurse):  In the office.  Subjective:    Dominique Russell is a 66 y.o. female patient of Pcp, No who had a Medicare Annual Wellness Visit today via telephone. 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 she has 2 children. she reports that she is socially active and does interact with friends/family regularly. she is minimally physically active and enjoys watching television and spending time with family.  Patient Care Team: Pcp, No as PCP - General Tat, Eustace Quail, DO as Consulting Physician (Neurology)  Advanced Directives 03/26/2021 03/03/2021 01/20/2021 05/20/2020 02/04/2020 08/21/2019 03/05/2019  Does Patient Have a Medical Advance Directive? No Yes No No No No No  Would patient like information on creating a medical advance directive? No - Patient declined - - - No - Patient declined - No - Patient declined    Hospital Utilization Over the Past 12 Months: # of hospitalizations or ER visits: 3 # of surgeries: 0  Review of Systems    Patient reports that her overall health is worse compared to last year.  History obtained from chart review and the patient  Patient Reported Readings (BP, Pulse, CBG, Weight, etc) none  Pain Assessment Pain : 0-10 Pain Score: 7  Pain Type: Chronic pain Pain Location: Knee Pain  Orientation: Left, Right Pain Descriptors / Indicators: Constant Pain Onset: More than a month ago Pain Frequency: Constant Pain Relieving Factors: Medication  Pain Relieving Factors: Medication  Current Medications & Allergies (verified) Allergies as of 03/26/2021       Reactions   Amlodipine Other (See Comments)   Causes her to have shakes.        Medication List        Accurate as of March 26, 2021 10:39 AM. If you have any questions, ask your nurse or doctor.          acetaminophen 650 MG CR tablet Commonly known as: TYLENOL Take 650 mg by mouth every 8 (eight) hours as needed for pain.   B-D UF III MINI PEN NEEDLES 31G X 5 MM Misc Generic drug: Insulin Pen Needle USE AS DIRECTED WITH INSULIN AND VICTOZA.   clobetasol ointment 0.05 % Commonly known as: TEMOVATE Apply 1 application topically 2 (two) times daily. To affected area(s) as needed, max 2-3 weeks to avoid whitening/thinning skin   colchicine 0.6 MG tablet Take 1 tablet (0.6 mg total) by mouth daily. Appt for further refills   cyclobenzaprine 10 MG tablet Commonly known as: FLEXERIL TAKE 0.5-1 TABLETS (5-10 MG TOTAL) BY MOUTH 3 (THREE) TIMES DAILY AS NEEDED FOR MUSCLE SPASMS. CAUTION: CAN CAUSE DROWSINESS   diclofenac sodium 1 % Gel Commonly known as: VOLTAREN Place onto the skin.   DULoxetine 60 MG capsule Commonly known as: CYMBALTA TAKE 1 CAPSULE (60 MG TOTAL) BY MOUTH DAILY. MUST MAKE APPOINTMENT   Fish Oil 1000 MG Caps Take 1 capsule by mouth daily.   fluticasone 50 MCG/ACT  nasal spray Commonly known as: FLONASE Place 2 sprays into both nostrils daily.   folic acid 800 MCG tablet Commonly known as: FOLVITE Take 400 mcg by mouth daily.   hydrochlorothiazide 25 MG tablet Commonly known as: HYDRODIURIL Take 1 tablet (25 mg total) by mouth daily. NO REFILLS. NEEDS TO TRANSFER CARE TO NEW PCP   insulin aspart protamine- aspart (70-30) 100 UNIT/ML injection Commonly known as: NOVOLOG  MIX 70/30 Inject into the skin.   ipratropium 0.06 % nasal spray Commonly known as: ATROVENT Place 2 sprays into both nostrils 4 (four) times daily.   Lyrica 150 MG capsule Generic drug: pregabalin Take 1 tablet by mouth 2 (two) times daily.   metFORMIN 500 MG 24 hr tablet Commonly known as: GLUCOPHAGE-XR Take 500 mg by mouth every morning.   ONE TOUCH ULTRA TEST test strip Generic drug: glucose blood   primidone 50 MG tablet Commonly known as: MYSOLINE TAKE 3 TABLETS (150 MG TOTAL) BY MOUTH 2 (TWO) TIMES DAILY AS NEEDED.   rosuvastatin 20 MG tablet Commonly known as: CRESTOR Take 20 mg by mouth daily.   traZODone 150 MG tablet Commonly known as: DESYREL TAKE 1 TABLET BY MOUTH EVERYDAY AT BEDTIME   valsartan 320 MG tablet Commonly known as: DIOVAN TAKE 1 TABLET BY MOUTH EVERY DAY   vitamin B-12 1000 MCG tablet Commonly known as: CYANOCOBALAMIN Take 1,000 mcg by mouth daily.   Vitamin D-3 25 MCG (1000 UT) Caps Take 2 capsules (2,000 Units total) by mouth daily.   Radiance Platinum Vitamin D3 125 MCG (5000 UT) Tabs Generic drug: Cholecalciferol Take 5,000 Units by mouth once a week.        History (reviewed): Past Medical History:  Diagnosis Date   Diabetes (HCC)    Essential tremor    Fibromyalgia    Hypertension    Past Surgical History:  Procedure Laterality Date   BREAST REDUCTION SURGERY     Family History  Problem Relation Age of Onset   Depression Mother    Hypertension Mother    Kidney failure Father    Kidney failure Brother    Other Sister        MVA   Tremor Neg Hx    Social History   Socioeconomic History   Marital status: Legally Separated    Spouse name: Not on file   Number of children: 2   Years of education: HS   Highest education level: 12th grade  Occupational History    Comment: Retired.  Tobacco Use   Smoking status: Former    Types: Cigarettes    Quit date: 02/22/1995    Years since quitting: 26.1   Smokeless  tobacco: Never  Vaping Use   Vaping Use: Never used  Substance and Sexual Activity   Alcohol use: Yes    Comment: occasionally   Drug use: No   Sexual activity: Not Currently  Other Topics Concern   Not on file  Social History Narrative   Patient lives at home with her daughter. She enjoys spending time with her family and watching television.   Social Determinants of Health   Financial Resource Strain: Low Risk    Difficulty of Paying Russell Expenses: Not hard at all  Food Insecurity: No Food Insecurity   Worried About Programme researcher, broadcasting/film/video in the Last Year: Never true   Ran Out of Food in the Last Year: Never true  Transportation Needs: No Transportation Needs   Lack of Transportation (Medical): No  Lack of Transportation (Non-Medical): No  Physical Activity: Inactive   Days of Exercise per Week: 0 days   Minutes of Exercise per Session: 0 min  Stress: Stress Concern Present   Feeling of Stress : Very much  Social Connections: Moderately Isolated   Frequency of Communication with Friends and Family: More than three times a week   Frequency of Social Gatherings with Friends and Family: Not on file   Attends Religious Services: More than 4 times per year   Active Member of [employer] or Organizations: No   Attends Archivist Meetings: Never   Marital Status: Separated    Activities of Daily Russell In your present state of health, do you have any difficulty performing the following activities: 03/26/2021  Hearing? N  Vision? N  Difficulty concentrating or making decisions? N  Walking or climbing stairs? N  Dressing or bathing? N  Doing errands, shopping? N  Preparing Food and eating ? Y  Comment Her daughter prepares her food but she can microwave and eat by herself.  Using the Toilet? N  In the past six months, have you accidently leaked urine? Y  Comment especially if she has an alcoholic beverage.  Do you have problems with loss of bowel control? Y  Comment  especially when she was on metformin.  Managing your Medications? N  Managing your Finances? N  Housekeeping or managing your Housekeeping? Y  Comment her daughter helps with the housekeeping.  Some recent data might be hidden    Patient Education/ Literacy How often do you need to have someone help you when you read instructions, pamphlets, or other written materials from your doctor or pharmacy?: 1 - Never What is the last grade level you completed in school?: 12th grade  Exercise Current Exercise Habits: Home exercise routine, Type of exercise: stretching;strength training/weights, Time (Minutes): 60, Frequency (Times/Week): 2, Weekly Exercise (Minutes/Week): 120, Intensity: Moderate, Exercise limited by: orthopedic condition(s)  Diet Patient reports consuming 2 meals a day and 0 snack(s) a day Patient reports that her primary diet is: Regular Patient reports that she does have regular access to food.   Depression Screen PHQ 2/9 Scores 03/26/2021 11/25/2019 05/21/2019 10/11/2018 07/03/2018 10/26/2016 09/14/2016  PHQ - 2 Score 3 3 1 2 4 5 5   PHQ- 9 Score 3 16 13 20 16 20 21      Fall Risk Fall Risk  03/26/2021 01/20/2021 05/20/2020 08/21/2019 05/21/2019  Falls in the past year? 1 1 0 0 1  Number falls in past yr: 1 1 0 0 0  Injury with Fall? 0 0 0 0 0  Risk for fall due to : History of fall(s);Orthopedic patient - - - -  Follow up Falls evaluation completed;Education provided;Falls prevention discussed - - - -     Objective:  Dominique Russell seemed alert and oriented and she participated appropriately during our telephone visit.  Blood Pressure Weight BMI  BP Readings from Last 3 Encounters:  02/09/21 115/72  09/15/20 128/90  09/11/20 116/60   Wt Readings from Last 3 Encounters:  02/09/21 237 lb 0.6 oz (107.5 kg)  09/15/20 233 lb 1.3 oz (105.7 kg)  09/11/20 237 lb (107.5 kg)   BMI Readings from Last 1 Encounters:  02/09/21 40.69 kg/m    *Unable to obtain current vital signs,  weight, and BMI due to telephone visit type  Hearing/Vision  Dominique Russell did not seem to have difficulty with hearing/understanding during the telephone conversation Reports that she has not had a  formal eye exam by an eye care professional within the past year Reports that she has not had a formal hearing evaluation within the past year *Unable to fully assess hearing and vision during telephone visit type  Cognitive Function: 6CIT Screen 03/26/2021  What Year? 0 points  What month? 0 points  What time? 0 points  Count back from 20 0 points  Months in reverse 2 points  Repeat phrase 6 points  Total Score 8   (Normal:0-7, Significant for Dysfunction: >8)  Normal Cognitive Function Screening: Yes   Immunization & Health Maintenance Record Immunization History  Administered Date(s) Administered   Fluad Quad(high Dose 65+) 11/20/2018   H1N1 01/24/2008   Influenza Split 11/27/2007, 11/14/2008, 12/02/2014   Influenza,inj,Quad PF,6+ Mos 11/23/2012, 10/26/2016, 12/27/2017, 11/25/2019   Influenza,inj,quad, With Preservative 01/14/2014   Influenza-Unspecified 12/13/2010, 11/01/2011, 11/23/2012, 11/22/2015, 12/17/2015, 01/26/2021   Moderna Covid-19 Vaccine Bivalent Booster 74yrs & up 01/26/2021   PFIZER(Purple Top)SARS-COV-2 Vaccination 06/10/2019, 07/01/2019, 01/23/2020   Pneumococcal Conjugate-13 05/21/2019   Pneumococcal Polysaccharide-23 03/29/2011, 09/14/2016   Tdap 09/14/2016   Zoster Recombinat (Shingrix) 08/01/2018, 10/23/2018   Zoster, Live 01/14/2014    Health Maintenance  Topic Date Due   OPHTHALMOLOGY EXAM  03/26/2021 (Originally 08/27/2019)   FOOT EXAM  04/20/2021 (Originally 05/20/2020)   MAMMOGRAM  08/05/2021   Pneumonia Vaccine 60+ Years old (4 - PPSV23 if available, else PCV20) 09/14/2021   COLONOSCOPY (Pts 45-65yrs Insurance coverage will need to be confirmed)  02/22/2024   TETANUS/TDAP  09/15/2026   DEXA SCAN  06/25/2029   INFLUENZA VACCINE  Completed   COVID-19  Vaccine  Completed   Hepatitis C Screening  Completed   Zoster Vaccines- Shingrix  Completed   HPV VACCINES  Aged Out   HEMOGLOBIN A1C  Discontinued       Assessment  This is a routine wellness examination for Dominique Russell.  Health Maintenance: Due or Overdue There are no preventive care reminders to display for this patient.   Dominique Russell does not need a referral for Community Assistance: Care Management:   no Social Work:    no Prescription Assistance:  no Nutrition/Diabetes Education:  no   Plan:  Personalized Goals  Goals Addressed               This Visit's Progress     Patient Stated (pt-stated)        03/26/2021 AWV Goal: Exercise for General Health  Patient will verbalize understanding of the benefits of increased physical activity: Exercising regularly is important. It will improve your overall fitness, flexibility, and endurance. Regular exercise also will improve your overall health. It can help you control your weight, reduce stress, and improve your bone density. Over the next year, patient will increase physical activity as tolerated with a goal of at least 150 minutes of moderate physical activity per week.  You can tell that you are exercising at a moderate intensity if your heart starts beating faster and you start breathing faster but can still hold a conversation. Moderate-intensity exercise ideas include: Walking 1 mile (1.6 km) in about 15 minutes Biking Hiking Golfing Dancing Water aerobics Patient will verbalize understanding of everyday activities that increase physical activity by providing examples like the following: Yard work, such as: Insurance underwriter Gardening Washing windows or floors Patient will be able to explain general safety guidelines for exercising:  Before you start a new exercise program, talk  with your health care provider. Do not  exercise so much that you hurt yourself, feel dizzy, or get very short of breath. Wear comfortable clothes and wear shoes with good support. Drink plenty of water while you exercise to prevent dehydration or heat stroke. Work out until your breathing and your heartbeat get faster.        Personalized Health Maintenance & Screening Recommendations  Eye exam  Foot exam Mammogram- Scheduled at [facility] sometime in February.  Lung Cancer Screening Recommended: no (Low Dose CT Chest recommended if Age 18-80 years, 30 pack-year currently smoking OR have quit w/in past 15 years) Hepatitis C Screening recommended: no HIV Screening recommended: no  Advanced Directives: Written information was not prepared per patient's request.  Referrals & Orders No orders of the defined types were placed in this encounter.   Follow-up Plan Follow-up with Dominique Bouche, NP as planned Schedule your eye exam.  Foot exam can be done at your next in-office visit. Medicare wellness visit in one year. AVS printed and mailed to the patient.   I have personally reviewed and noted the following in the patients chart:   Medical and social history Use of alcohol, tobacco or illicit drugs  Current medications and supplements Functional ability and status Nutritional status Physical activity Advanced directives List of other physicians Hospitalizations, surgeries, and ER visits in previous 12 months Vitals Screenings to include cognitive, depression, and falls Referrals and appointments  In addition, I have reviewed and discussed with Dominique Russell certain preventive protocols, quality metrics, and best practice recommendations. A written personalized care plan for preventive services as well as general preventive health recommendations is available and can be mailed to the patient at her request.      Tinnie Gens, RN  03/26/2021

## 2021-03-31 ENCOUNTER — Ambulatory Visit: Payer: BC Managed Care – PPO | Admitting: Physical Therapy

## 2021-03-31 ENCOUNTER — Other Ambulatory Visit: Payer: Self-pay

## 2021-03-31 DIAGNOSIS — R29898 Other symptoms and signs involving the musculoskeletal system: Secondary | ICD-10-CM | POA: Diagnosis not present

## 2021-03-31 DIAGNOSIS — R262 Difficulty in walking, not elsewhere classified: Secondary | ICD-10-CM | POA: Diagnosis not present

## 2021-03-31 DIAGNOSIS — M6281 Muscle weakness (generalized): Secondary | ICD-10-CM | POA: Diagnosis not present

## 2021-03-31 NOTE — Therapy (Signed)
[facility] [place] [address] [place], [place], [address] Phone: [phone]   Fax:  [phone]  Physical Therapy Treatment  Patient Details  Name: Dominique Russell MRN: [id number] Date of Birth: [birth year] Referring Provider (PT): Larinda Buttery, joy   Encounter Date: 03/31/2021   PT End of Session - 03/31/21 1504     Visit Number 6    Number of Visits 12    Date for PT Re-Evaluation 04/14/21    Authorization Type BCBS    PT Start Time 1430    PT Stop Time 1510    PT Time Calculation (min) 40 min    Activity Tolerance Patient tolerated treatment well    Behavior During Therapy [facility] for tasks assessed/performed             Past Medical History:  Diagnosis Date   Diabetes (HCC)    Essential tremor    Fibromyalgia    Hypertension     Past Surgical History:  Procedure Laterality Date   BREAST REDUCTION SURGERY      There were no vitals filed for this visit.   Subjective Assessment - 03/31/21 1437     Subjective Pt staates "my knees are killing me today"    Currently in Pain? Yes    Pain Score 7     Pain Location Knee    Pain Orientation Right;Left    Pain Descriptors / Indicators Aching                               [facility] Adult PT Treatment/Exercise - 03/31/21 0001       Knee/Hip Exercises: Stretches   Passive Hamstring Stretch Right;Left;2 reps;20 seconds    Passive Hamstring Stretch Limitations seated      Knee/Hip Exercises: Aerobic   Nustep L5 x 5 min for warm up      Knee/Hip Exercises: Standing   Gait Training side stepping 15' x 4 with close supervision, backward gait CGA 15' x 2, farmers carry 7# 1 lap each hand    Other Standing Knee Exercises standing on foam with head turns laterally and A/P x 10 each, CGA. tandem stance on ground 30 sec trials with intermittent UE assist      Knee/Hip Exercises: Seated   Long Arc Quad Strengthening;Right;Left;15 reps    Long Arc Quad  Weight 2 lbs.    Ball Squeeze 15 reps    Clamshell with [employer]   15 reps                         PT Long Term Goals - 03/26/21 1337       PT LONG TERM GOAL #1   Title Pt will be independent with HEP    Time 6    Period Weeks    Status On-going    Target Date 04/14/21      PT LONG TERM GOAL #2   Title Pt will improve 5x STS to <= 14 seconds to demo improved strength and balance    Time 6    Period Weeks    Status On-going    Target Date 04/14/21      PT LONG TERM GOAL #3   Title Pt will tolerate standing x 10 minutes with pain <= 2/10    Time 6    Period Weeks    Status On-going    Target Date 04/14/21  PT LONG TERM GOAL #4   Title Pt will improve LE strength to 4/5 bilat to improve standing and walking tolerance    Time 6    Period Weeks    Status On-going    Target Date 04/14/21                   Plan - 03/31/21 1505     Clinical Impression Statement Pt continues with significant knee pain which improves with exercise. PT continues to educate pt on importance of daily exercise to decrease pain.    PT Next Visit Plan LE strength and balance, UPDATE HEP    PT Home Exercise Plan [id number]    Consulted and Agree with Plan of Care Patient             Patient will benefit from skilled therapeutic intervention in order to improve the following deficits and impairments:     Visit Diagnosis: Muscle weakness (generalized)  Difficulty in walking, not elsewhere classified  Other symptoms and signs involving the musculoskeletal system     Problem List Patient Active Problem List   Diagnosis Date Noted   Hyperuricemia 09/11/2020   Arthralgia of right foot 09/11/2020   Peripheral edema 09/11/2020   DDD (degenerative disc disease), cervical 01/23/2020   Otitis media, left 09/04/2019   OSA (obstructive sleep apnea) 01/22/2019   Bone disorder 09/06/2018   Essential hypertension 03/17/2016   Psoriasis 03/17/2016    Long-term insulin use in type 2 diabetes (HCC) 02/09/2015   Morbid obesity with BMI of 45.0-49.9, adult (HCC) 05/16/2014   DM (diabetes mellitus) (HCC) 01/23/2013   Low back pain 01/23/2013   Shoulder pain 01/23/2013   Bilateral knee pain 12/26/2012   Bilateral shoulder pain 12/26/2012   Essential tremor 09/26/2012   Headache 09/26/2012   Benign essential tremor 09/26/2012   Fibromyalgia 03/02/2011   Generalized osteoarthritis of multiple sites 03/02/2011   Rheumatoid arthritis (HCC) 03/02/2011    Dominique Russell, PT 03/31/2021, 3:09 PM  [facility] [address] [place], [place], [address] Phone: [phone]   Fax:  [phone]  Name: Dominique Russell MRN: [id number] Date of Birth: 1955-12-27

## 2021-04-01 ENCOUNTER — Other Ambulatory Visit: Payer: Self-pay | Admitting: Osteopathic Medicine

## 2021-04-01 ENCOUNTER — Other Ambulatory Visit: Payer: Self-pay | Admitting: Sports Medicine

## 2021-04-01 DIAGNOSIS — Z1231 Encounter for screening mammogram for malignant neoplasm of breast: Secondary | ICD-10-CM | POA: Diagnosis not present

## 2021-04-01 LAB — HM MAMMOGRAPHY

## 2021-04-01 NOTE — Telephone Encounter (Signed)
Routing to covering provider. Rx not listed in active med list. Thanks in advance.

## 2021-04-02 ENCOUNTER — Other Ambulatory Visit: Payer: Self-pay

## 2021-04-02 ENCOUNTER — Ambulatory Visit: Payer: BC Managed Care – PPO | Admitting: Physical Therapy

## 2021-04-02 DIAGNOSIS — M6281 Muscle weakness (generalized): Secondary | ICD-10-CM | POA: Diagnosis not present

## 2021-04-02 DIAGNOSIS — R29898 Other symptoms and signs involving the musculoskeletal system: Secondary | ICD-10-CM

## 2021-04-02 DIAGNOSIS — R262 Difficulty in walking, not elsewhere classified: Secondary | ICD-10-CM | POA: Diagnosis not present

## 2021-04-02 NOTE — Therapy (Signed)
[facility] [address] [place], [place], [address] Phone: [phone]   Fax:  [phone]  Physical Therapy Treatment  Patient Details  Name: Dominique Russell MRN: [id number] Date of Birth: 1956-01-27 Referring Provider (Dominique Russell): Larinda Butteryjessup, joy   Encounter Date: 04/02/2021   Dominique Russell End of Session - 04/02/21 1442     Visit Number 7    Number of Visits 12    Date for Dominique Russell Re-Evaluation 04/14/21    Authorization Type BCBS    Dominique Russell Start Time 1400    Dominique Russell Stop Time 1443    Dominique Russell Time Calculation (min) 43 min    Activity Tolerance Patient tolerated treatment well    Behavior During Therapy [facility] for tasks assessed/performed             Past Medical History:  Diagnosis Date   Diabetes (HCC)    Essential tremor    Fibromyalgia    Hypertension     Past Surgical History:  Procedure Laterality Date   BREAST REDUCTION SURGERY      There were no vitals filed for this visit.   Subjective Assessment - 04/02/21 1403     Subjective Dominique Russell states "i always feel better after therapy"    Patient Stated Goals decrease pain and "be able to do what I need to do"    Currently in Pain? Yes    Pain Score 7     Pain Location Knee    Pain Orientation Right;Left    Pain Descriptors / Indicators Sore;Aching    Pain Type Chronic pain                [facility] Dominique Russell Assessment - 04/02/21 0001       Assessment   Medical Diagnosis bilateral leg soreness    Referring Provider (Dominique Russell) jessup, joy    Onset Date/Surgical Date 02/03/21    Next MD Visit 05/17/21      Strength   Overall Strength Comments bilat hip strength grossly 3+/5    Right Knee Flexion 4/5    Right Knee Extension 4/5    Left Knee Flexion 4-/5    Left Knee Extension 4/5                           [facility] Adult Dominique Russell Treatment/Exercise - 04/02/21 0001       Knee/Hip Exercises: Stretches   Passive Hamstring Stretch Right;Left;2 reps;20 seconds    Passive Hamstring  Stretch Limitations seated      Knee/Hip Exercises: Aerobic   Nustep L5 x 5 min for warm up      Knee/Hip Exercises: Standing   Heel Raises Limitations 12 reps    Hip ADduction Right;Left;10 reps    Rocker Board Limitations x 10 laterally and A/P    Gait Training backward walking CGA, gait training over obstacles with CGA, cues for foot clearance    Other Standing Knee Exercises tandem stance on ground 2 x 30 sec each with intermittent UE support, standing on foam with narrow BOS without UE support x 30 sec, then with head turns and narrow BOS on foam x 10 bilat intermittent UE support      Knee/Hip Exercises: Seated   Long Arc Quad Strengthening;Left;Right    Long Arc Quad Weight 2 lbs.    Clamshell with TheraBand Green   x 15  Dominique Russell Long Term Goals - 03/26/21 1337       Dominique Russell LONG TERM GOAL #1   Title Dominique Russell will be independent with HEP    Time 6    Period Weeks    Status On-going    Target Date 04/14/21      Dominique Russell LONG TERM GOAL #2   Title Dominique Russell will improve 5x STS to <= 14 seconds to demo improved strength and balance    Time 6    Period Weeks    Status On-going    Target Date 04/14/21      Dominique Russell LONG TERM GOAL #3   Title Dominique Russell will tolerate standing x 10 minutes with pain <= 2/10    Time 6    Period Weeks    Status On-going    Target Date 04/14/21      Dominique Russell LONG TERM GOAL #4   Title Dominique Russell will improve LE strength to 4/5 bilat to improve standing and walking tolerance    Time 6    Period Weeks    Status On-going    Target Date 04/14/21                   Plan - 04/02/21 1442     Clinical Impression Statement Dominique Russell has improved strength and balance and is able to ambulate without SPC. Dominique Russell states she feels better after each session. Able to add more standing exercises this visit and progress gait training    Dominique Russell Next Visit Plan update HEP, progress balance and strength    Dominique Russell Home Exercise Plan [email or web address]    Consulted and Agree with Plan of Care  Patient             Patient will benefit from skilled therapeutic intervention in order to improve the following deficits and impairments:     Visit Diagnosis: Muscle weakness (generalized)  Difficulty in walking, not elsewhere classified  Other symptoms and signs involving the musculoskeletal system     Problem List Patient Active Problem List   Diagnosis Date Noted   Hyperuricemia 09/11/2020   Arthralgia of right foot 09/11/2020   Peripheral edema 09/11/2020   DDD (degenerative disc disease), cervical 01/23/2020   Otitis media, left 09/04/2019   OSA (obstructive sleep apnea) 01/22/2019   Bone disorder 09/06/2018   Essential hypertension 03/17/2016   Psoriasis 03/17/2016   Long-term insulin use in type 2 diabetes (HCC) 02/09/2015   Morbid obesity with BMI of 45.0-49.9, adult (HCC) 05/16/2014   DM (diabetes mellitus) (HCC) 01/23/2013   Low back pain 01/23/2013   Shoulder pain 01/23/2013   Bilateral knee pain 12/26/2012   Bilateral shoulder pain 12/26/2012   Essential tremor 09/26/2012   Headache 09/26/2012   Benign essential tremor 09/26/2012   Fibromyalgia 03/02/2011   Generalized osteoarthritis of multiple sites 03/02/2011   Rheumatoid arthritis (HCC) 03/02/2011    Dominique Russell, Dominique Russell 04/02/2021, 2:44 PM  [facility] [place] [address] [place], [place], [address] Phone: [phone]   Fax:  [phone]  Name: Dominique Russell MRN: [id number] Date of Birth: 1955-03-03

## 2021-04-06 DIAGNOSIS — E119 Type 2 diabetes mellitus without complications: Secondary | ICD-10-CM | POA: Diagnosis not present

## 2021-04-06 LAB — HM DIABETES EYE EXAM

## 2021-04-07 ENCOUNTER — Other Ambulatory Visit: Payer: Self-pay

## 2021-04-07 ENCOUNTER — Ambulatory Visit: Payer: BC Managed Care – PPO | Admitting: Physical Therapy

## 2021-04-07 DIAGNOSIS — M6281 Muscle weakness (generalized): Secondary | ICD-10-CM | POA: Diagnosis not present

## 2021-04-07 DIAGNOSIS — R262 Difficulty in walking, not elsewhere classified: Secondary | ICD-10-CM

## 2021-04-07 DIAGNOSIS — R29898 Other symptoms and signs involving the musculoskeletal system: Secondary | ICD-10-CM

## 2021-04-07 NOTE — Therapy (Signed)
[facility] [place] [address] [place], [place], [address] Phone: [phone]   Fax:  [phone]  Physical Therapy Treatment  Patient Details  Name: Dominique Russell MRN: [id number] Date of Birth: [birth year] Referring Provider (PT): Dominique Russell, Russell   Encounter Date: 04/07/2021   PT End of Session - 04/07/21 1633     Visit Number 8    Number of Visits 12    Date for PT Re-Evaluation 04/14/21    Authorization Type BCBS    PT Start Time 1555    PT Stop Time 1635    PT Time Calculation (min) 40 min    Activity Tolerance Patient tolerated treatment well    Behavior During Therapy [facility] for tasks assessed/performed             Past Medical History:  Diagnosis Date   Diabetes (HCC)    Essential tremor    Fibromyalgia    Hypertension     Past Surgical History:  Procedure Laterality Date   BREAST REDUCTION SURGERY      There were no vitals filed for this visit.   Subjective Assessment - 04/07/21 1556     Subjective Pt states "i think this is helping my knees feel better"    Patient Stated Goals decrease pain and "be able to do what I need to do"    Currently in Pain? Yes    Pain Score 5     Pain Location Knee    Pain Orientation Left;Right                [facility] PT Assessment - 04/07/21 0001       Assessment   Medical Diagnosis bilateral leg soreness    Referring Provider (PT) Dominique Russell    Onset Date/Surgical Date 02/03/21    Next MD Visit 05/17/21      Ambulation/Gait   Gait Comments improved upright posture during gait      Standardized Balance Assessment   Standardized Balance Assessment Timed Up and Go Test    Five times sit to stand comments  17.38 seconds      Timed Up and Go Test   Normal TUG (seconds) 21                           [facility] Adult PT Treatment/Exercise - 04/07/21 0001       Knee/Hip Exercises: Stretches   Passive Hamstring Stretch Right;Left;2 reps;20 seconds     Passive Hamstring Stretch Limitations seated      Knee/Hip Exercises: Aerobic   Nustep L5 x 5 min for warm up      Knee/Hip Exercises: Standing   Heel Raises Limitations 12 reps    Knee Flexion Right;Left;10 reps    Hip Abduction Right;Left;10 reps    Gait Training walking with ball toss to self with min A, backward walking CGA    Other Standing Knee Exercises tandem stance on ground x 30 sec intermittent UE support, narrow BOS on foam with 10x head turns laterally then 10x vertical head turns      Knee/Hip Exercises: Seated   Long Arc Quad Left;Right;15 reps    Long Arc Quad Weight 2 lbs.    Clamshell with TheraBand Green   x 15                    PT Education - 04/07/21 1602     Education Details updated HEP  Person(s) Educated Patient    Methods Explanation;Demonstration;Handout    Comprehension Returned demonstration;Verbalized understanding                 PT Long Term Goals - 03/26/21 1337       PT LONG TERM GOAL #1   Title Pt will be independent with HEP    Time 6    Period Weeks    Status On-going    Target Date 04/14/21      PT LONG TERM GOAL #2   Title Pt will improve 5x STS to <= 14 seconds to demo improved strength and balance    Time 6    Period Weeks    Status On-going    Target Date 04/14/21      PT LONG TERM GOAL #3   Title Pt will tolerate standing x 10 minutes with pain <= 2/10    Time 6    Period Weeks    Status On-going    Target Date 04/14/21      PT LONG TERM GOAL #4   Title Pt will improve LE strength to 4/5 bilat to improve standing and walking tolerance    Time 6    Period Weeks    Status On-going    Target Date 04/14/21                   Plan - 04/07/21 1634     Clinical Impression Statement Pt continues with improving strength and standing tolerance. better able to perform tandem stance this visit. Updated HEP to add standing therex. 5x STS unchanged    PT Next Visit Plan progress LE strength  and balance. Gait speed and dynamic gait    PT Home Exercise Plan [email or web address]    Consulted and Agree with Plan of Care Patient             Patient will benefit from skilled therapeutic intervention in order to improve the following deficits and impairments:     Visit Diagnosis: Muscle weakness (generalized)  Difficulty in walking, not elsewhere classified  Other symptoms and signs involving the musculoskeletal system     Problem List Patient Active Problem List   Diagnosis Date Noted   Hyperuricemia 09/11/2020   Arthralgia of right foot 09/11/2020   Peripheral edema 09/11/2020   DDD (degenerative disc disease), cervical 01/23/2020   Otitis media, left 09/04/2019   OSA (obstructive sleep apnea) 01/22/2019   Bone disorder 09/06/2018   Essential hypertension 03/17/2016   Psoriasis 03/17/2016   Long-term insulin use in type 2 diabetes (HCC) 02/09/2015   Morbid obesity with BMI of 45.0-49.9, adult (HCC) 05/16/2014   DM (diabetes mellitus) (HCC) 01/23/2013   Low back pain 01/23/2013   Shoulder pain 01/23/2013   Bilateral knee pain 12/26/2012   Bilateral shoulder pain 12/26/2012   Essential tremor 09/26/2012   Headache 09/26/2012   Benign essential tremor 09/26/2012   Fibromyalgia 03/02/2011   Generalized osteoarthritis of multiple sites 03/02/2011   Rheumatoid arthritis (HCC) 03/02/2011    Dominique Russell, PT 04/07/2021, 4:38 PM  [facility] [place] [address] [place], [place], [address] Phone: [phone]   Fax:  [phone]  Name: Dominique Russell MRN: [id number] Date of Birth: 1955/05/29

## 2021-04-07 NOTE — Patient Instructions (Signed)
Access Code: [email or web address] URL: [email or web address] Date: 04/07/2021 Prepared by: Reggy Eye  Exercises Seated Hamstring Stretch - 1 x daily - 7 x weekly - 1 sets - 3 reps - 20-30 sec hold Seated Long Arc Quad - 1 x daily - 7 x weekly - 2 sets - 10 reps - 3-5 seconds hold Seated Hip Adduction Isometrics with Ball - 1 x daily - 7 x weekly - 1 sets - 10 reps - 3-5 seconds hold Standing Tandem Balance with Counter Support - 1 x daily - 7 x weekly - 1 sets - 5 reps - 20-30 seconds hold Sit to Stand - 1 x daily - 7 x weekly - 2 sets - 5 reps Standing Hip Abduction with Counter Support - 1 x daily - 7 x weekly - 2 sets - 10 reps Standing Knee Flexion with Counter Support - 1 x daily - 7 x weekly - 2 sets - 10 reps Heel Raises with Counter Support - 1 x daily - 7 x weekly - 2 sets - 10 reps

## 2021-04-14 ENCOUNTER — Ambulatory Visit: Payer: BC Managed Care – PPO | Admitting: Physical Therapy

## 2021-04-14 DIAGNOSIS — R262 Difficulty in walking, not elsewhere classified: Secondary | ICD-10-CM

## 2021-04-14 DIAGNOSIS — R29898 Other symptoms and signs involving the musculoskeletal system: Secondary | ICD-10-CM

## 2021-04-14 DIAGNOSIS — M6281 Muscle weakness (generalized): Secondary | ICD-10-CM | POA: Diagnosis not present

## 2021-04-14 NOTE — Therapy (Signed)
[facility] [address] [place], [place], [address] Phone: [phone]   Fax:  [phone]  Physical Therapy Treatment and ReCertification  Patient Details  Name: Dominique Russell MRN: [id number] Date of Birth: September 22, 1955 Referring Provider (PT): Larinda Butteryjessup, joy   Encounter Date: 04/14/2021   PT End of Session - 04/14/21 1555     Visit Number 9    Number of Visits 21    Date for PT Re-Evaluation 05/26/21    PT Start Time 1515    PT Stop Time 1600    PT Time Calculation (min) 45 min    Activity Tolerance Patient tolerated treatment well    Behavior During Therapy [facility] for tasks assessed/performed             Past Medical History:  Diagnosis Date   Diabetes (HCC)    Essential tremor    Fibromyalgia    Hypertension     Past Surgical History:  Procedure Laterality Date   BREAST REDUCTION SURGERY      There were no vitals filed for this visit.   Subjective Assessment - 04/14/21 1519     Subjective Pt states "I don't know why but I am hurting today"    Patient Stated Goals decrease pain and "be able to do what I need to do"    Currently in Pain? Yes    Pain Score 9     Pain Location Knee    Pain Orientation Right;Left    Pain Descriptors / Indicators Aching;Sore    Pain Type Chronic pain                [facility] PT Assessment - 04/14/21 0001       Assessment   Medical Diagnosis bilateral leg soreness    Referring Provider (PT) jessup, joy    Onset Date/Surgical Date 02/03/21    Next MD Visit 05/17/21      Strength   Right Knee Flexion 4/5    Right Knee Extension 4/5    Left Knee Flexion 4/5    Left Knee Extension 4/5      Transfers   Five time sit to stand comments  22.44 seconds                           [facility] Adult PT Treatment/Exercise - 04/14/21 0001       Ambulation/Gait   Gait Comments pt using LBQC for gait today      Knee/Hip Exercises: Stretches   Passive  Hamstring Stretch Right;Left;2 reps;20 seconds    Passive Hamstring Stretch Limitations seated      Knee/Hip Exercises: Aerobic   Nustep L5 x 5 min for warm up      Knee/Hip Exercises: Standing   Heel Raises Limitations 12    Knee Flexion Right;Left;15 reps    Hip Abduction Right;Left;15 reps    Gait Training backward, sidestep and tandem gait at counter with 1 UE support    Other Standing Knee Exercises standing on foam head turns horizontal x 10, on foam cone reach with trunk rotaiton x 10 bilat, marching on foam x 10 bilat, mini squats on foam x 10      Knee/Hip Exercises: Seated   Long Arc Quad Left;Right;15 reps    Long Arc Quad Weight 2 lbs.  PT Long Term Goals - 04/14/21 1555       PT LONG TERM GOAL #1   Title Pt will be independent with HEP    Status On-going    Target Date 05/26/21      PT LONG TERM GOAL #2   Title Pt will improve 5x STS to <= 14 seconds to demo improved strength and balance    Baseline 22.44 sec    Time 6    Period Weeks    Status On-going    Target Date 05/26/21      PT LONG TERM GOAL #3   Title Pt will tolerate standing x 10 minutes with pain <= 2/10    Baseline pt able to tolerate 5 min standing with pain 2/10    Time 6    Period Weeks    Status On-going    Target Date 05/26/21      PT LONG TERM GOAL #4   Title Pt will improve LE strength to 4/5 bilat to improve standing and walking tolerance    Status Achieved                   Plan - 04/14/21 1557     Clinical Impression Statement Pt is progressing towards goals. She shows improved LE strength but still with limited activity tolerance and increased risk of falls. Pt will benefit from continued PT services to address remaining goals    PT Frequency 2x / week    PT Duration 6 weeks    PT Treatment/Interventions Aquatic Therapy;Cryotherapy;Electrical Stimulation;Manual techniques;Patient/family education;Therapeutic  exercise;Neuromuscular re-education;Gait training;Therapeutic activities;Vasopneumatic Device;Dry needling;Taping;Moist Heat;Iontophoresis 4mg /ml Dexamethasone    PT Next Visit Plan progress LE strength and balance. Gait speed and dynamic gait    PT Home Exercise Plan [email or web address]    Consulted and Agree with Plan of Care Patient             Patient will benefit from skilled therapeutic intervention in order to improve the following deficits and impairments:     Visit Diagnosis: Muscle weakness (generalized) - Plan: PT plan of care cert/re-cert  Difficulty in walking, not elsewhere classified - Plan: PT plan of care cert/re-cert  Other symptoms and signs involving the musculoskeletal system - Plan: PT plan of care cert/re-cert     Problem List Patient Active Problem List   Diagnosis Date Noted   Hyperuricemia 09/11/2020   Arthralgia of right foot 09/11/2020   Peripheral edema 09/11/2020   DDD (degenerative disc disease), cervical 01/23/2020   Otitis media, left 09/04/2019   OSA (obstructive sleep apnea) 01/22/2019   Bone disorder 09/06/2018   Essential hypertension 03/17/2016   Psoriasis 03/17/2016   Long-term insulin use in type 2 diabetes (HCC) 02/09/2015   Morbid obesity with BMI of 45.0-49.9, adult (HCC) 05/16/2014   DM (diabetes mellitus) (HCC) 01/23/2013   Low back pain 01/23/2013   Shoulder pain 01/23/2013   Bilateral knee pain 12/26/2012   Bilateral shoulder pain 12/26/2012   Essential tremor 09/26/2012   Headache 09/26/2012   Benign essential tremor 09/26/2012   Fibromyalgia 03/02/2011   Generalized osteoarthritis of multiple sites 03/02/2011   Rheumatoid arthritis (HCC) 03/02/2011    Amiylah Anastos, PT 04/14/2021, 4:00 PM  [facility] [place] [address] [place], [place], [address] Phone: [phone]   Fax:  [phone]  Name: Dominique Russell MRN: [id number] Date of Birth: [birth year]

## 2021-04-16 ENCOUNTER — Ambulatory Visit: Payer: BC Managed Care – PPO | Admitting: Physical Therapy

## 2021-04-16 ENCOUNTER — Other Ambulatory Visit: Payer: Self-pay

## 2021-04-21 ENCOUNTER — Other Ambulatory Visit: Payer: Self-pay

## 2021-04-21 ENCOUNTER — Ambulatory Visit: Payer: BC Managed Care – PPO | Attending: Medical-Surgical | Admitting: Physical Therapy

## 2021-04-21 DIAGNOSIS — M6281 Muscle weakness (generalized): Secondary | ICD-10-CM | POA: Diagnosis not present

## 2021-04-21 DIAGNOSIS — R29898 Other symptoms and signs involving the musculoskeletal system: Secondary | ICD-10-CM | POA: Diagnosis not present

## 2021-04-21 DIAGNOSIS — R262 Difficulty in walking, not elsewhere classified: Secondary | ICD-10-CM | POA: Diagnosis not present

## 2021-04-21 NOTE — Therapy (Signed)
[place] [facility] ?[address] ?[place], [place], [address] ?Phone: [phone]   Fax:  [phone] ? ?Physical Therapy Treatment ? ?Patient Details  ?Name: Dominique Russell ?MRN: [id number] ?Date of Birth: 08-Aug-1955 ?Referring Provider (Dominique Russell): jessup, joy ? ? ?Encounter Date: 04/21/2021 ? ? Dominique Russell End of Session - 04/21/21 1542   ? ? Visit Number 10   ? Number of Visits 21   ? Date for Dominique Russell Re-Evaluation 05/26/21   ? Dominique Russell Start Time 1507   ? Dominique Russell Stop Time 1545   ? Dominique Russell Time Calculation (min) 38 min   ? Activity Tolerance Patient tolerated treatment well   ? Behavior During Therapy [facility] for tasks assessed/performed   ? ?  ?  ? ?  ? ? ?Past Medical History:  ?Diagnosis Date  ? Diabetes (HCC)   ? Essential tremor   ? Fibromyalgia   ? Hypertension   ? ? ?Past Surgical History:  ?Procedure Laterality Date  ? BREAST REDUCTION SURGERY    ? ? ?There were no vitals filed for this visit. ? ? Subjective Assessment - 04/21/21 1513   ? ? Subjective Dominique Russell states she fell last night while "trying to put my bedroom shoes on while walking". Dominique Russell states her Rt knee is hurting badly. No other injuries reported from the fall   ? Patient Stated Goals decrease pain and "be able to do what I need to do"   ? Currently in Pain? Yes   ? Pain Score 9    ? Pain Location Knee   ? Pain Orientation Right   ? Pain Descriptors / Indicators Aching   ? ?  ?  ? ?  ? ? ? ? ? ? ? ? ? ? ? ? ? ? ? ? ? ? ? ? [facility] Adult Dominique Russell Treatment/Exercise - 04/21/21 0001   ? ?  ? Knee/Hip Exercises: Stretches  ? Passive Hamstring Stretch Right;Left;2 reps;20 seconds   ? Passive Hamstring Stretch Limitations seated   ?  ? Knee/Hip Exercises: Aerobic  ? Nustep L5 x 5 min for warm up   beginning and end of session for knee ROM and endurance  ?  ? Knee/Hip Exercises: Seated  ? Long [employer] Quad Left;Right;15 reps   ? Long Arc Quad Weight 2 lbs.   ? Long [employer] Quad Limitations weight on Lt LE only due to Rt knee pain   ? Marching Right;Left;2  sets;10 reps   ? Marching Limitations weight on Lt LE only due to Rt knee pain   ? Marching Weights 2 lbs.   ?  ? Modalities  ? Modalities Cryotherapy   ?  ? Cryotherapy  ? Number Minutes Cryotherapy 10 Minutes   ? Cryotherapy Location Knee   right  ? Type of Cryotherapy Ice pack   ? ?  ?  ? ?  ? ? ? ? ? ? ? ? ? ? ? ? ? ? ? Dominique Russell Long Term Goals - 04/14/21 1555   ? ?  ? Dominique Russell LONG TERM GOAL #1  ? Title Dominique Russell will be independent with HEP   ? Status On-going   ? Target Date 05/26/21   ?  ? Dominique Russell LONG TERM GOAL #2  ? Title Dominique Russell will improve 5x STS to <= 14 seconds to demo improved strength and balance   ? Baseline 22.44 sec   ? Time 6   ? Period Weeks   ? Status On-going   ? Target Date 05/26/21   ?  ?  Dominique Russell LONG TERM GOAL #3  ? Title Dominique Russell will tolerate standing x 10 minutes with pain <= 2/10   ? Baseline Dominique Russell able to tolerate 5 min standing with pain 2/10   ? Time 6   ? Period Weeks   ? Status On-going   ? Target Date 05/26/21   ?  ? Dominique Russell LONG TERM GOAL #4  ? Title Dominique Russell will improve LE strength to 4/5 bilat to improve standing and walking tolerance   ? Status Achieved   ? ?  ?  ? ?  ? ? ? ? ? ? ? ? Plan - 04/21/21 1542   ? ? Clinical Impression Statement Dominique Russell limited by increased Rt knee pain today due to a fall last night. Treatment focused on seated therex to continue to maintain mobility and decrease pain. Dominique Russell states she feels better at the end of treatment   ? Dominique Russell Next Visit Plan return to standing and balance exercises, dynamic gait as able   ? Dominique Russell Home Exercise Plan [email or web address]   ? Consulted and Agree with Plan of Care Patient   ? ?  ?  ? ?  ? ? ?Patient will benefit from skilled therapeutic intervention in order to improve the following deficits and impairments:    ? ?Visit Diagnosis: ?Muscle weakness (generalized) ? ?Difficulty in walking, not elsewhere classified ? ?Other symptoms and signs involving the musculoskeletal system ? ? ? ? ?Problem List ?Patient Active Problem List  ? Diagnosis Date Noted  ? Hyperuricemia 09/11/2020  ? Arthralgia  of right foot 09/11/2020  ? Peripheral edema 09/11/2020  ? DDD (degenerative disc disease), cervical 01/23/2020  ? Otitis media, left 09/04/2019  ? OSA (obstructive sleep apnea) 01/22/2019  ? Bone disorder 09/06/2018  ? Essential hypertension 03/17/2016  ? Psoriasis 03/17/2016  ? Long-term insulin use in type 2 diabetes (HCC) 02/09/2015  ? Morbid obesity with BMI of 45.0-49.9, adult (HCC) 05/16/2014  ? DM (diabetes mellitus) (HCC) 01/23/2013  ? Low back pain 01/23/2013  ? Shoulder pain 01/23/2013  ? Bilateral knee pain 12/26/2012  ? Bilateral shoulder pain 12/26/2012  ? Essential tremor 09/26/2012  ? Headache 09/26/2012  ? Benign essential tremor 09/26/2012  ? Fibromyalgia 03/02/2011  ? Generalized osteoarthritis of multiple sites 03/02/2011  ? Rheumatoid arthritis (HCC) 03/02/2011  ? ? ?Dominique Russell, Dominique Russell ?04/21/2021, 3:44 PM ? ?[place] [facility] ?[address] [place] [address] ?[place], [place], [address] ?Phone: [phone]   Fax:  [phone] ? ?Name: Dominique Russell ?MRN: [id number] ?Date of Birth: [birth year] ? ? ? ?

## 2021-04-23 ENCOUNTER — Other Ambulatory Visit: Payer: Self-pay

## 2021-04-23 ENCOUNTER — Ambulatory Visit: Payer: BC Managed Care – PPO | Admitting: Physical Therapy

## 2021-04-23 DIAGNOSIS — M6281 Muscle weakness (generalized): Secondary | ICD-10-CM

## 2021-04-23 DIAGNOSIS — R29898 Other symptoms and signs involving the musculoskeletal system: Secondary | ICD-10-CM | POA: Diagnosis not present

## 2021-04-23 DIAGNOSIS — R262 Difficulty in walking, not elsewhere classified: Secondary | ICD-10-CM

## 2021-04-23 NOTE — Patient Instructions (Signed)
Access Code: [email or web address] ?URL: [email or web address] ?Date: 04/23/2021 ?Prepared by: Isabelle Course ? ?Exercises ?Seated Hamstring Stretch - 1 x daily - 7 x weekly - 1 sets - 3 reps - 20-30 sec hold ?Seated Long Arc Quad - 1 x daily - 7 x weekly - 2 sets - 10 reps - 3-5 seconds hold ?Seated Hip Adduction Isometrics with Ball - 1 x daily - 7 x weekly - 1 sets - 10 reps - 3-5 seconds hold ?Standing Tandem Balance with Counter Support - 1 x daily - 7 x weekly - 1 sets - 5 reps - 20-30 seconds hold ?Sit to Stand - 1 x daily - 7 x weekly - 2 sets - 5 reps ?Standing Hip Abduction with Counter Support - 1 x daily - 7 x weekly - 2 sets - 10 reps ?Standing Knee Flexion with Counter Support - 1 x daily - 7 x weekly - 2 sets - 10 reps ?Heel Raises with Counter Support - 1 x daily - 7 x weekly - 2 sets - 10 reps ? ?

## 2021-04-23 NOTE — Therapy (Signed)
[facility] ?[address] ?[place], [place], [address] ?Phone: [phone]   Fax:  [phone] ? ?Physical Therapy Treatment and Discharge ? ?Patient Details  ?Name: Dominique Russell ?MRN: [id number] ?Date of Birth: 1955-08-19 ?Referring Provider (PT): jessup, joy ? ? ?Encounter Date: 04/23/2021 ? ? PT End of Session - 04/23/21 1517   ? ? Visit Number 11   ? Number of Visits 21   ? Date for PT Re-Evaluation 05/26/21   ? Authorization Type BCBS   ? PT Start Time 1440   ? PT Stop Time 1520   ? PT Time Calculation (min) 40 min   ? Activity Tolerance Patient tolerated treatment well   ? Behavior During Therapy [facility] for tasks assessed/performed   ? ?  ?  ? ?  ? ? ?Past Medical History:  ?Diagnosis Date  ? Diabetes ([place])   ? Essential tremor   ? Fibromyalgia   ? Hypertension   ? ? ?Past Surgical History:  ?Procedure Laterality Date  ? BREAST REDUCTION SURGERY    ? ? ?There were no vitals filed for this visit. ? ? Subjective Assessment - 04/23/21 1443   ? ? Subjective Pt states "I am feeling better. I am ready to be done"   ? Patient Stated Goals decrease pain and "be able to do what I need to do"   ? Currently in Pain? Yes   ? Pain Score 5    ? Pain Location Knee   ? Pain Orientation Right   ? Pain Descriptors / Indicators Aching   ? ?  ?  ? ?  ? ? ? ? ? [facility] PT Assessment - 04/23/21 0001   ? ?  ? Assessment  ? Medical Diagnosis bilateral leg soreness   ? Referring Provider (PT) jessup, joy   ? Onset Date/Surgical Date 02/03/21   ? Next MD Visit 05/17/21   ?  ? Transfers  ? Five time sit to stand comments  17.2 seconds   ? ?  ?  ? ?  ? ? ? ? ? ? ? ? ? ? ? ? ? ? ? ? [place] Adult PT Treatment/Exercise - 04/23/21 0001   ? ?  ? Knee/Hip Exercises: Stretches  ? Passive Hamstring Stretch Right;Left;2 reps;20 seconds   ? Passive Hamstring Stretch Limitations seated   ?  ? Knee/Hip Exercises: Aerobic  ? Nustep L5 x 5 min for warm up   ?  ? Knee/Hip Exercises: Standing  ? Heel  Raises Limitations 15   ? Knee Flexion Right;Left;2 sets;10 reps   ? Hip Abduction Right;Left;2 sets;10 reps   ? Gait Training backwards, tandem and sidestepping at counter   ? Other Standing Knee Exercises standing on foam head turns x 10, trunk rotation with cone tap x 10, marching x 10, mini squats x 10   ?  ? Knee/Hip Exercises: Seated  ? Long Arc Quad Right;Left;2 sets;10 reps   ? Long Arc Quad Weight 2 lbs.   ? ?  ?  ? ?  ? ? ? ? ? ? ? ? ? ? PT Education - 04/23/21 1517   ? ? Education Details HEP, plan for d/c   ? Person(s) Educated Patient   ? Methods Explanation;Demonstration;Handout   ? Comprehension Returned demonstration;Verbalized understanding   ? ?  ?  ? ?  ? ? ? ? ? ? PT Long Term Goals - 04/23/21 1444   ? ?  ? PT LONG TERM  GOAL #1  ? Title Pt will be independent with HEP   ? Status Achieved   ?  ? PT LONG TERM GOAL #2  ? Title Pt will improve 5x STS to <= 14 seconds to demo improved strength and balance   ? Status Not Met   ?  ? PT LONG TERM GOAL #3  ? Title Pt will tolerate standing x 10 minutes with pain <= 2/10   ? Baseline pt able to tolerate 5 min standing with pain 2/10   ? Status Not Met   ?  ? PT LONG TERM GOAL #4  ? Title Pt will improve LE strength to 4/5 bilat to improve standing and walking tolerance   ? Status Achieved   ? ?  ?  ? ?  ? ? ? ? ? ? ? ? Plan - 04/23/21 1520   ? ? Clinical Impression Statement Pt has improved 5x STS by 5 seconds since eval, showing improved strength and balance. She feels ready to d/c to HEP today   ? PT Next Visit Plan d/c   ? PT Home Exercise Plan [email or web address]   ? Consulted and Agree with Plan of Care Patient   ? ?  ?  ? ?  ? ? ?Patient will benefit from skilled therapeutic intervention in order to improve the following deficits and impairments:    ? ?Visit Diagnosis: ?Muscle weakness (generalized) ? ?Difficulty in walking, not elsewhere classified ? ?Other symptoms and signs involving the musculoskeletal system ? ? ? ? ?Problem List ?Patient Active Problem  List  ? Diagnosis Date Noted  ? Hyperuricemia 09/11/2020  ? Arthralgia of right foot 09/11/2020  ? Peripheral edema 09/11/2020  ? DDD (degenerative disc disease), cervical 01/23/2020  ? Otitis media, left 09/04/2019  ? OSA (obstructive sleep apnea) 01/22/2019  ? Bone disorder 09/06/2018  ? Essential hypertension 03/17/2016  ? Psoriasis 03/17/2016  ? Long-term insulin use in type 2 diabetes ([place]) 02/09/2015  ? Morbid obesity with BMI of 45.0-49.9, adult ([place]) 05/16/2014  ? DM (diabetes mellitus) ([place]) 01/23/2013  ? Low back pain 01/23/2013  ? Shoulder pain 01/23/2013  ? Bilateral knee pain 12/26/2012  ? Bilateral shoulder pain 12/26/2012  ? Essential tremor 09/26/2012  ? Headache 09/26/2012  ? Benign essential tremor 09/26/2012  ? Fibromyalgia 03/02/2011  ? Generalized osteoarthritis of multiple sites 03/02/2011  ? Rheumatoid arthritis ([place]) 03/02/2011  ? ?PHYSICAL THERAPY DISCHARGE SUMMARY ? ?Visits from Start of Care: 11 ? ?Current functional level related to goals / functional outcomes: ?Improved strength and balance ?  ?Remaining deficits: ?Knee pain ?  ?Education / Equipment: ?HEP  ? ?Patient agrees to discharge. Patient goals were partially met. Patient is being discharged due to being pleased with the current functional level. ? ?Dominique Russell, PT ?04/23/2021, 3:21 PM ? ?[place] ?[address] ?[place], [place], [address] ?Phone: [phone]   Fax:  [phone] ? ?Name: Dominique Russell ?MRN: [id number] ?Date of Birth: 1955/04/03 ? ? ? ?

## 2021-04-27 DIAGNOSIS — M170 Bilateral primary osteoarthritis of knee: Secondary | ICD-10-CM | POA: Diagnosis not present

## 2021-04-27 DIAGNOSIS — G609 Hereditary and idiopathic neuropathy, unspecified: Secondary | ICD-10-CM | POA: Diagnosis not present

## 2021-04-27 DIAGNOSIS — M25561 Pain in right knee: Secondary | ICD-10-CM | POA: Diagnosis not present

## 2021-04-27 DIAGNOSIS — M797 Fibromyalgia: Secondary | ICD-10-CM | POA: Diagnosis not present

## 2021-04-28 ENCOUNTER — Other Ambulatory Visit: Payer: Self-pay

## 2021-04-28 ENCOUNTER — Ambulatory Visit (INDEPENDENT_AMBULATORY_CARE_PROVIDER_SITE_OTHER): Payer: BC Managed Care – PPO | Admitting: Sports Medicine

## 2021-04-28 DIAGNOSIS — M5441 Lumbago with sciatica, right side: Secondary | ICD-10-CM

## 2021-04-28 MED ORDER — QUAD CANE/SMALL BASE MISC: 0 refills | Status: DC

## 2021-04-28 NOTE — Progress Notes (Signed)
? ? ?  Procedures performed today:   ? ?None. ? ?Independent interpretation of notes and tests performed by another provider:  ? ?None. ? ?Brief History, Exam, Impression, and Recommendations:   ? ?Low back pain ?Dominique Russell has some low back pain and weakness, she was treated by her PCP, physical therapy, improved considerably. ?She is really here just for a prescription for a cane, for post with a smaller footprint. ?Happy to write this, return as needed. ? ? ? ?___________________________________________ ?Ihor Austin. Benjamin Stain, M.D., ABFM., CAQSM. ?Primary Care and Sports Medicine ?[facility] ? ?Adjunct Instructor of Family Medicine  ?[employer] ?

## 2021-04-28 NOTE — Assessment & Plan Note (Signed)
Dominique Russell has some low back pain and weakness, she was treated by her PCP, physical therapy, improved considerably. ?She is really here just for a prescription for a cane, for post with a smaller footprint. ?Happy to write this, return as needed. ?

## 2021-04-29 DIAGNOSIS — S0285XA Fracture of orbit, unspecified, initial encounter for closed fracture: Secondary | ICD-10-CM | POA: Diagnosis not present

## 2021-05-01 ENCOUNTER — Other Ambulatory Visit: Payer: Self-pay | Admitting: Sports Medicine

## 2021-05-01 ENCOUNTER — Other Ambulatory Visit: Payer: Self-pay | Admitting: Family Medicine

## 2021-05-01 DIAGNOSIS — I10 Essential (primary) hypertension: Secondary | ICD-10-CM

## 2021-05-11 ENCOUNTER — Other Ambulatory Visit: Payer: Self-pay | Admitting: Family Medicine

## 2021-05-11 DIAGNOSIS — I10 Essential (primary) hypertension: Secondary | ICD-10-CM

## 2021-05-12 ENCOUNTER — Other Ambulatory Visit: Payer: Self-pay | Admitting: Family Medicine

## 2021-05-17 ENCOUNTER — Other Ambulatory Visit: Payer: Self-pay

## 2021-05-17 ENCOUNTER — Encounter: Payer: Self-pay | Admitting: Medical-Surgical

## 2021-05-17 ENCOUNTER — Ambulatory Visit (INDEPENDENT_AMBULATORY_CARE_PROVIDER_SITE_OTHER): Payer: BC Managed Care – PPO | Admitting: Medical-Surgical

## 2021-05-17 VITALS — BP 150/91 | HR 96 | Wt 238.1 lb

## 2021-05-17 DIAGNOSIS — N393 Stress incontinence (female) (male): Secondary | ICD-10-CM | POA: Diagnosis not present

## 2021-05-17 DIAGNOSIS — I10 Essential (primary) hypertension: Secondary | ICD-10-CM | POA: Diagnosis not present

## 2021-05-17 DIAGNOSIS — E790 Hyperuricemia without signs of inflammatory arthritis and tophaceous disease: Secondary | ICD-10-CM

## 2021-05-17 DIAGNOSIS — M797 Fibromyalgia: Secondary | ICD-10-CM

## 2021-05-17 DIAGNOSIS — K219 Gastro-esophageal reflux disease without esophagitis: Secondary | ICD-10-CM

## 2021-05-17 DIAGNOSIS — E1169 Type 2 diabetes mellitus with other specified complication: Secondary | ICD-10-CM

## 2021-05-17 DIAGNOSIS — R519 Headache, unspecified: Secondary | ICD-10-CM

## 2021-05-17 DIAGNOSIS — R413 Other amnesia: Secondary | ICD-10-CM

## 2021-05-17 DIAGNOSIS — Z794 Long term (current) use of insulin: Secondary | ICD-10-CM

## 2021-05-17 DIAGNOSIS — Z7689 Persons encountering health services in other specified circumstances: Secondary | ICD-10-CM | POA: Diagnosis not present

## 2021-05-17 MED ORDER — COLCHICINE 0.6 MG PO TABS: ORAL_TABLET | ORAL | 0 refills | Status: DC

## 2021-05-17 MED ORDER — FAMOTIDINE 20 MG PO TABS: 20.0000 mg | ORAL_TABLET | Freq: Two times a day (BID) | ORAL | 0 refills | Status: DC

## 2021-05-17 MED ORDER — HYDROCHLOROTHIAZIDE 25 MG PO TABS: 25.0000 mg | ORAL_TABLET | Freq: Every day | ORAL | 1 refills | Status: DC

## 2021-05-17 MED ORDER — FLUTICASONE PROPIONATE 50 MCG/ACT NA SUSP: 2.0000 | Freq: Every day | NASAL | 6 refills | Status: DC

## 2021-05-17 MED ORDER — DULOXETINE HCL 60 MG PO CPEP: 60.0000 mg | ORAL_CAPSULE | Freq: Every day | ORAL | 1 refills | Status: AC

## 2021-05-17 NOTE — Progress Notes (Signed)
?HPI with pertinent ROS:  ? ?CC: Transfer care ? ?HPI: ?Pleasant 66 year old female presenting today to transfer care to new PCP and for the following: ? ?Reflux: reports a history of acid reflux not currently treated with medication. Was treated in the past but can't remember the name of the medication. Thinks it was prescribed by neurology. Wakes up in the morning, coughing up mucus with a scratchy throat and a white crust around the edges of her mouth.  ? ?Urinary incontinence: happens when she coughs and it's very bothersome especially in the mornings.  ? ?Memory loss: thinks she may be getting some Alzheimer's disease because her short term memory is awful. She regularly forgets things (appointment times, where her keys are, etc). She lives at home with her daughter but still drives herself to appointments, grocery shopping, and the pharmacy. Tends to restrict her driving to known locations and reports she has never gotten lost or had trouble remembering where she is going. Has quite a bit of angst over her forgetfulness but has not been evaluated further or ever discussed medications that may help. ? ?Needs refills on several of her chronic medications including Colchicine, Cymbalta, Flonase, and HCTZ.  ? ?Mood: taking Cymbalta and Trazodone as prescribed, tolerating well without side effects. Admits that her concerns about her memory and her body's failings are making her feel pretty down and that she often has thoughts that maybe she would be better off dead. Has no intention for self harm and has never attempted such or developed a plan.  ? ?I reviewed the past medical history, family history, social history, surgical history, and allergies today and no changes were needed.  Please see the problem list section below in epic for further details. ? ? ?Physical exam:  ? ?General: Well Developed, well nourished, and in no acute distress.  ?Neuro: Alert and oriented x3. Poor historian, forgetful.  ?HEENT:  Normocephalic, atraumatic.  ?Skin: Warm and dry. ?Cardiac: Regular rate and rhythm, no murmurs rubs or gallops, no lower extremity edema.  ?Respiratory: Clear to auscultation bilaterally. Not using accessory muscles, speaking in full sentences. ? ?Impression and Recommendations:   ? ?1. Encounter to establish care ?Reviewed available information and discussed care concerns with patient.  ? ?2. Gastroesophageal reflux disease, unspecified whether esophagitis present ?Start famotidine 20mg  twice daily. If not helpful, consider short term treatment with PPI then wean to the lowest dose effective.  ? ?3. Essential hypertension ?Continue HCTZ and Valsartan as prescribed. BP slightly elevated today but she was rushing and running late on arrival.  ?- hydrochlorothiazide (HYDRODIURIL) 25 MG tablet; Take 1 tablet (25 mg total) by mouth daily.  Dispense: 90 tablet; Refill: 1 ? ?4. Stress incontinence ?Unfortunately, adding medication is not likely to help and may worsen falls. We will work on managing her cough for the short term management of incontinence.  ? ?5. Short-term memory loss ?Offered referral to neurology for further evaluation, patient declined and would like to hold off for now.  ? ?6. Nonintractable headache, unspecified chronicity pattern, unspecified headache type ?Continue Lyrica and Flexeril as prescribed.  ? ?7. Hyperuricemia ?Continue Allopurinol and Colchicine as prescribed.  ? ?8. Fibromyalgia ?Continue Cymbalta as prescribed.  ?- DULoxetine (CYMBALTA) 60 MG capsule; Take 1 capsule (60 mg total) by mouth daily.  Dispense: 90 capsule; Refill: 1 ? ?9. Type 2 diabetes mellitus with other specified complication, with long-term current use of insulin ([place]) ?Referring to podiatry.  ?- Ambulatory referral to Podiatry ? ?Return in about  6 weeks (around 06/28/2021) for GERD follow-up. ?___________________________________________ ?Clearnce Sorrel, DNP, APRN, FNP-BC ?Primary Care and Sports Medicine ?[place] ?

## 2021-05-31 ENCOUNTER — Other Ambulatory Visit: Payer: Self-pay | Admitting: Osteopathic Medicine

## 2021-06-10 ENCOUNTER — Other Ambulatory Visit: Payer: Self-pay | Admitting: Osteopathic Medicine

## 2021-06-10 DIAGNOSIS — I10 Essential (primary) hypertension: Secondary | ICD-10-CM

## 2021-06-15 DIAGNOSIS — F3181 Bipolar II disorder: Secondary | ICD-10-CM | POA: Diagnosis not present

## 2021-06-24 ENCOUNTER — Telehealth: Payer: Self-pay

## 2021-06-24 NOTE — Telephone Encounter (Signed)
I called [facility] Legal Team at [phone] ? ?LM for Letitia Libra to return my call with the information that they are requesting for the patient. ?
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
## 2021-06-29 ENCOUNTER — Ambulatory Visit: Payer: BC Managed Care – PPO | Admitting: Medical-Surgical

## 2021-07-03 ENCOUNTER — Other Ambulatory Visit: Payer: Self-pay | Admitting: Medical-Surgical

## 2021-07-14 ENCOUNTER — Other Ambulatory Visit: Payer: Self-pay | Admitting: Medical-Surgical

## 2021-07-20 DIAGNOSIS — M545 Low back pain, unspecified: Secondary | ICD-10-CM | POA: Diagnosis not present

## 2021-07-20 DIAGNOSIS — M19011 Primary osteoarthritis, right shoulder: Secondary | ICD-10-CM | POA: Diagnosis not present

## 2021-07-20 DIAGNOSIS — M797 Fibromyalgia: Secondary | ICD-10-CM | POA: Diagnosis not present

## 2021-07-20 DIAGNOSIS — M25561 Pain in right knee: Secondary | ICD-10-CM | POA: Diagnosis not present

## 2021-07-20 DIAGNOSIS — M25562 Pain in left knee: Secondary | ICD-10-CM | POA: Diagnosis not present

## 2021-07-20 DIAGNOSIS — M170 Bilateral primary osteoarthritis of knee: Secondary | ICD-10-CM | POA: Diagnosis not present

## 2021-07-20 DIAGNOSIS — M542 Cervicalgia: Secondary | ICD-10-CM | POA: Diagnosis not present

## 2021-07-20 DIAGNOSIS — G609 Hereditary and idiopathic neuropathy, unspecified: Secondary | ICD-10-CM | POA: Diagnosis not present

## 2021-07-20 DIAGNOSIS — M19012 Primary osteoarthritis, left shoulder: Secondary | ICD-10-CM | POA: Diagnosis not present

## 2021-07-27 DIAGNOSIS — Z7985 Long-term (current) use of injectable non-insulin antidiabetic drugs: Secondary | ICD-10-CM | POA: Diagnosis not present

## 2021-07-27 DIAGNOSIS — E781 Pure hyperglyceridemia: Secondary | ICD-10-CM | POA: Diagnosis not present

## 2021-07-27 DIAGNOSIS — Z794 Long term (current) use of insulin: Secondary | ICD-10-CM | POA: Diagnosis not present

## 2021-07-27 DIAGNOSIS — Z6841 Body Mass Index (BMI) 40.0 and over, adult: Secondary | ICD-10-CM | POA: Diagnosis not present

## 2021-07-27 DIAGNOSIS — Z79899 Other long term (current) drug therapy: Secondary | ICD-10-CM | POA: Diagnosis not present

## 2021-07-27 DIAGNOSIS — E1142 Type 2 diabetes mellitus with diabetic polyneuropathy: Secondary | ICD-10-CM | POA: Diagnosis not present

## 2021-07-27 DIAGNOSIS — Z7982 Long term (current) use of aspirin: Secondary | ICD-10-CM | POA: Diagnosis not present

## 2021-07-27 DIAGNOSIS — E669 Obesity, unspecified: Secondary | ICD-10-CM | POA: Diagnosis not present

## 2021-07-30 ENCOUNTER — Other Ambulatory Visit: Payer: Self-pay | Admitting: Medical-Surgical

## 2021-08-13 ENCOUNTER — Other Ambulatory Visit: Payer: Self-pay | Admitting: Medical-Surgical

## 2021-08-15 ENCOUNTER — Other Ambulatory Visit: Payer: Self-pay | Admitting: Physician Assistant

## 2021-08-15 DIAGNOSIS — F339 Major depressive disorder, recurrent, unspecified: Secondary | ICD-10-CM

## 2021-08-17 ENCOUNTER — Ambulatory Visit (INDEPENDENT_AMBULATORY_CARE_PROVIDER_SITE_OTHER): Payer: BC Managed Care – PPO | Admitting: Physician Assistant

## 2021-08-17 ENCOUNTER — Encounter: Payer: Self-pay | Admitting: Physician Assistant

## 2021-08-17 VITALS — BP 115/62 | HR 112 | Ht 64.0 in | Wt 237.0 lb

## 2021-08-17 DIAGNOSIS — R2689 Other abnormalities of gait and mobility: Secondary | ICD-10-CM

## 2021-08-17 DIAGNOSIS — R296 Repeated falls: Secondary | ICD-10-CM | POA: Diagnosis not present

## 2021-08-17 DIAGNOSIS — T753XXA Motion sickness, initial encounter: Secondary | ICD-10-CM

## 2021-08-17 DIAGNOSIS — Z6841 Body Mass Index (BMI) 40.0 and over, adult: Secondary | ICD-10-CM

## 2021-08-17 DIAGNOSIS — G63 Polyneuropathy in diseases classified elsewhere: Secondary | ICD-10-CM | POA: Diagnosis not present

## 2021-08-17 DIAGNOSIS — N3281 Overactive bladder: Secondary | ICD-10-CM | POA: Diagnosis not present

## 2021-08-17 DIAGNOSIS — I10 Essential (primary) hypertension: Secondary | ICD-10-CM

## 2021-08-17 MED ORDER — MIRABEGRON ER 50 MG PO TB24: 50.0000 mg | ORAL_TABLET | Freq: Every day | ORAL | 1 refills | Status: DC

## 2021-08-17 MED ORDER — SCOPOLAMINE 1 MG/3DAYS TD PT72: 1.0000 | MEDICATED_PATCH | TRANSDERMAL | 0 refills | Status: DC

## 2021-08-17 MED ORDER — HYDROCHLOROTHIAZIDE 25 MG PO TABS: 25.0000 mg | ORAL_TABLET | Freq: Every day | ORAL | 1 refills | Status: DC

## 2021-08-17 NOTE — Progress Notes (Signed)
Acute Office Visit  Subjective:     Patient ID: Dominique Russell, female    DOB: Feb 12, 1956, 66 y.o.   MRN: [id number]  Chief Complaint  Patient presents with   Follow-up    Pt  is having incontinent and gas and would like sea sick patches     HPI Patient is in today to get patches for motion sickness, discuss OAB, and get referral for PT.   .. Active Ambulatory Problems    Diagnosis Date Noted   Essential tremor 09/26/2012   Headache 09/26/2012   Benign essential tremor 09/26/2012   Bilateral knee pain 12/26/2012   Bilateral shoulder pain 12/26/2012   DM (diabetes mellitus) (HCC) 01/23/2013   Fibromyalgia 03/02/2011   Generalized osteoarthritis of multiple sites 03/02/2011   Long-term insulin use in type 2 diabetes (HCC) 02/09/2015   Low back pain 01/23/2013   Class 3 severe obesity due to excess calories with serious comorbidity and body mass index (BMI) of 40.0 to 44.9 in adult [facility]) 05/16/2014   Rheumatoid arthritis (HCC) 03/02/2011   Shoulder pain 01/23/2013   Essential hypertension 03/17/2016   Psoriasis 03/17/2016   Bone disorder 09/06/2018   OSA (obstructive sleep apnea) 01/22/2019   Otitis media, left 09/04/2019   DDD (degenerative disc disease), cervical 01/23/2020   Hyperuricemia 09/11/2020   Arthralgia of right foot 09/11/2020   Peripheral edema 09/11/2020   OAB (overactive bladder) 08/17/2021   Motion sickness 08/17/2021   Balance problems 08/17/2021   Frequent falls 08/17/2021   Polyneuropathy associated with underlying disease (HCC) 08/17/2021   Resolved Ambulatory Problems    Diagnosis Date Noted   No Resolved Ambulatory Problems   Past Medical History:  Diagnosis Date   Diabetes (HCC)    Hypertension    Pt is having  DM/RA/Obes  Physical [facility] Falls this year  .Marland Kitchen Active Ambulatory Problems    Diagnosis Date Noted   Essential tremor 09/26/2012   Headache 09/26/2012   Benign essential tremor  09/26/2012   Bilateral knee pain 12/26/2012   Bilateral shoulder pain 12/26/2012   DM (diabetes mellitus) (HCC) 01/23/2013   Fibromyalgia 03/02/2011   Generalized osteoarthritis of multiple sites 03/02/2011   Long-term insulin use in type 2 diabetes (HCC) 02/09/2015   Low back pain 01/23/2013   Class 3 severe obesity due to excess calories with serious comorbidity and body mass index (BMI) of 40.0 to 44.9 in adult [facility]) 05/16/2014   Rheumatoid arthritis (HCC) 03/02/2011   Shoulder pain 01/23/2013   Essential hypertension 03/17/2016   Psoriasis 03/17/2016   Bone disorder 09/06/2018   OSA (obstructive sleep apnea) 01/22/2019   Otitis media, left 09/04/2019   DDD (degenerative disc disease), cervical 01/23/2020   Hyperuricemia 09/11/2020   Arthralgia of right foot 09/11/2020   Peripheral edema 09/11/2020   OAB (overactive bladder) 08/17/2021   Motion sickness 08/17/2021   Balance problems 08/17/2021   Frequent falls 08/17/2021   Polyneuropathy associated with underlying disease (HCC) 08/17/2021   Resolved Ambulatory Problems    Diagnosis Date Noted   No Resolved Ambulatory Problems   Past Medical History:  Diagnosis Date   Diabetes (HCC)    Hypertension    HTN-doing well. Needs refills. No CP, palpitations, headaches or vision changes.   Pt is falling more. She has had 5 falls this year with no significant injuries. She feels like her balance is off and neuropathy is worsening. She would like PT again.   She is going on a cruise  and would the like ear patches.   Her OAB continues to be problematic. She is having to wear diapers due to urgency and leakage. She would like to try medications.   ROS  See HPI.     Objective:    BP 115/62   Pulse (!) 112   Ht 5\' 4"  (1.626 m)   Wt 236 lb 15.5 oz (107.5 kg)   SpO2 99%   BMI 40.68 kg/m  BP Readings from Last 3 Encounters:  08/17/21 115/62  05/17/21 (!) 150/91  02/09/21 115/72   Wt Readings from Last 3 Encounters:   08/17/21 236 lb 15.5 oz (107.5 kg)  05/17/21 238 lb 1.3 oz (108 kg)  02/09/21 237 lb 0.6 oz (107.5 kg)      Physical Exam Constitutional:      Appearance: Normal appearance. She is obese.  HENT:     Head: Normocephalic.  Cardiovascular:     Rate and Rhythm: Normal rate and regular rhythm.     Pulses: Normal pulses.  Pulmonary:     Effort: Pulmonary effort is normal.     Breath sounds: Normal breath sounds.  Musculoskeletal:     Right lower leg: No edema.     Left lower leg: No edema.     Comments: Lower ext strengths 5/5.   Neurological:     General: No focal deficit present.     Mental Status: She is alert and oriented to person, place, and time.  Psychiatric:        Mood and Affect: Mood normal.           Assessment & Plan:  .Marland KitchenBrailey was seen today for follow-up.  Diagnoses and all orders for this visit:  Motion sickness, initial encounter -     scopolamine (TRANSDERM-SCOP) 1 MG/3DAYS; Place 1 patch (1.5 mg total) onto the skin every 3 (three) days.  Essential hypertension -     hydrochlorothiazide (HYDRODIURIL) 25 MG tablet; Take 1 tablet (25 mg total) by mouth daily.  OAB (overactive bladder) -     mirabegron ER (MYRBETRIQ) 50 MG TB24 tablet; Take 1 tablet (50 mg total) by mouth daily.  Class 3 severe obesity due to excess calories with serious comorbidity and body mass index (BMI) of 40.0 to 44.9 in adult [facility]) -     Ambulatory referral to Physical Therapy  Balance problems -     Ambulatory referral to Physical Therapy  Frequent falls -     Ambulatory referral to Physical Therapy  Polyneuropathy associated with underlying disease (HCC) -     Ambulatory referral to Physical Therapy   Refilled medications needed.  Vitals look great.  Sent patches for motion sickness Discussed OAB and kegals Start mybetriq daily Discussed side effects Will get PT order for balance issues  Return in about 3 months (around 11/17/2021), or if symptoms worsen or fail  to improve.  Tandy Gaw, PA-C

## 2021-08-19 DIAGNOSIS — R29898 Other symptoms and signs involving the musculoskeletal system: Secondary | ICD-10-CM | POA: Diagnosis not present

## 2021-08-19 DIAGNOSIS — G63 Polyneuropathy in diseases classified elsewhere: Secondary | ICD-10-CM | POA: Diagnosis not present

## 2021-08-19 DIAGNOSIS — R296 Repeated falls: Secondary | ICD-10-CM | POA: Diagnosis not present

## 2021-08-19 DIAGNOSIS — R2689 Other abnormalities of gait and mobility: Secondary | ICD-10-CM | POA: Diagnosis not present

## 2021-08-19 DIAGNOSIS — Z6841 Body Mass Index (BMI) 40.0 and over, adult: Secondary | ICD-10-CM | POA: Diagnosis not present

## 2021-08-26 ENCOUNTER — Telehealth: Payer: Self-pay | Admitting: Medical-Surgical

## 2021-08-26 NOTE — Telephone Encounter (Signed)
Patient called and requested a referral to Podiatry because a couple of her toenails look like they are popping up

## 2021-09-01 NOTE — Telephone Encounter (Signed)
I have left patient one voicemail to let her know she is overdue for an appointment and I also tried a second time but her mailbox memory is full

## 2021-09-08 ENCOUNTER — Other Ambulatory Visit: Payer: Self-pay | Admitting: Osteopathic Medicine

## 2021-09-08 ENCOUNTER — Other Ambulatory Visit: Payer: Self-pay | Admitting: Medical-Surgical

## 2021-09-08 ENCOUNTER — Other Ambulatory Visit: Payer: Self-pay | Admitting: Neurology

## 2021-09-08 DIAGNOSIS — F339 Major depressive disorder, recurrent, unspecified: Secondary | ICD-10-CM

## 2021-09-08 DIAGNOSIS — G250 Essential tremor: Secondary | ICD-10-CM

## 2021-09-08 DIAGNOSIS — I10 Essential (primary) hypertension: Secondary | ICD-10-CM

## 2021-09-10 ENCOUNTER — Telehealth: Payer: Self-pay | Admitting: *Deleted

## 2021-09-10 NOTE — Chronic Care Management (AMB) (Signed)
  Care Coordination  Note  09/10/2021 Name: ZULA HOVSEPIAN MRN: [id number] DOB: 1955/11/20  IRISA GRIMSLEY is a 66 y.o. year old female who is a primary care patient of Christen Butter, NP. I reached out to Vergie Living by phone today to offer care coordination services.        Follow up plan: Unsuccessful telephone outreach attempt made. A HIPAA compliant phone message was left for the patient providing contact information and requesting a return call.   Burman Nieves, CCMA Care Coordination Care Guide Direct Dial: [phone]

## 2021-09-14 DIAGNOSIS — G609 Hereditary and idiopathic neuropathy, unspecified: Secondary | ICD-10-CM | POA: Diagnosis not present

## 2021-09-14 DIAGNOSIS — M170 Bilateral primary osteoarthritis of knee: Secondary | ICD-10-CM | POA: Diagnosis not present

## 2021-09-14 DIAGNOSIS — M797 Fibromyalgia: Secondary | ICD-10-CM | POA: Diagnosis not present

## 2021-09-14 DIAGNOSIS — M542 Cervicalgia: Secondary | ICD-10-CM | POA: Diagnosis not present

## 2021-09-14 DIAGNOSIS — M549 Dorsalgia, unspecified: Secondary | ICD-10-CM | POA: Diagnosis not present

## 2021-09-14 DIAGNOSIS — M25561 Pain in right knee: Secondary | ICD-10-CM | POA: Diagnosis not present

## 2021-09-14 DIAGNOSIS — M19011 Primary osteoarthritis, right shoulder: Secondary | ICD-10-CM | POA: Diagnosis not present

## 2021-09-14 DIAGNOSIS — M25562 Pain in left knee: Secondary | ICD-10-CM | POA: Diagnosis not present

## 2021-09-14 DIAGNOSIS — M19012 Primary osteoarthritis, left shoulder: Secondary | ICD-10-CM | POA: Diagnosis not present

## 2021-09-16 DIAGNOSIS — F3181 Bipolar II disorder: Secondary | ICD-10-CM | POA: Diagnosis not present

## 2021-10-05 ENCOUNTER — Other Ambulatory Visit: Payer: Self-pay | Admitting: Medical-Surgical

## 2021-10-05 DIAGNOSIS — I10 Essential (primary) hypertension: Secondary | ICD-10-CM

## 2021-10-08 NOTE — Chronic Care Management (AMB) (Unsigned)
  Care Coordination  Outreach Note  10/08/2021 Name: Dominique Russell MRN: [id number] DOB: 1955/06/18   Care Coordination Outreach Attempts  A second unsuccessful outreach was attempted today to offer the patient with information about available care coordination services as a benefit of their health plan.     Follow Up Plan:  Additional outreach attempts will be made to offer the patient care coordination information and services.   Encounter Outcome:  No Answer  Burman Nieves, CCMA Care Coordination Care Guide Direct Dial: [phone]

## 2021-10-11 NOTE — Chronic Care Management (AMB) (Signed)
  Care Coordination  Outreach Note  10/11/2021 Name: Dominique Russell MRN: [id number] DOB: [birth year]   Care Coordination Outreach Attempts  A third unsuccessful outreach was attempted today to offer the patient with information about available care coordination services as a benefit of their health plan.   Follow Up Plan:  No further outreach attempts will be made at this time. We have been unable to contact the patient to offer or enroll patient in care coordination services  Encounter Outcome:  No Answer  Burman Nieves, [facility] Care Coordination Care Guide Direct Dial: [phone]

## 2021-10-14 ENCOUNTER — Other Ambulatory Visit: Payer: Self-pay | Admitting: Family Medicine

## 2021-10-15 ENCOUNTER — Other Ambulatory Visit: Payer: Self-pay | Admitting: Medical-Surgical

## 2021-10-18 NOTE — Telephone Encounter (Signed)
Patient needs an appointment for further refills.  Last office visit 05/17/2021  NO Show 06/29/2021  Last filled 09/13/2021  Sent 15 day supply to the pharmacy. No refill.

## 2021-10-25 ENCOUNTER — Other Ambulatory Visit: Payer: Self-pay | Admitting: Medical-Surgical

## 2021-10-25 DIAGNOSIS — F339 Major depressive disorder, recurrent, unspecified: Secondary | ICD-10-CM

## 2021-10-25 DIAGNOSIS — I10 Essential (primary) hypertension: Secondary | ICD-10-CM

## 2021-10-29 ENCOUNTER — Other Ambulatory Visit: Payer: Self-pay | Admitting: Neurology

## 2021-10-29 ENCOUNTER — Other Ambulatory Visit: Payer: Self-pay | Admitting: Medical-Surgical

## 2021-10-29 ENCOUNTER — Other Ambulatory Visit: Payer: Self-pay | Admitting: Osteopathic Medicine

## 2021-10-29 DIAGNOSIS — G250 Essential tremor: Secondary | ICD-10-CM

## 2021-11-01 ENCOUNTER — Telehealth: Payer: Self-pay | Admitting: Medical-Surgical

## 2021-11-01 NOTE — Telephone Encounter (Signed)
Patient is well overdue for follow-up with me as her PCP.  She is also overdue for labs since her last ones were checked in July 2022.  She will need a follow-up appointment and labs for refills since this medication is greatly dependent on her current kidney function and uric acid levels.

## 2021-11-01 NOTE — Telephone Encounter (Signed)
Pt called.  She is requesting a refill on her [place].

## 2021-11-02 NOTE — Telephone Encounter (Signed)
Called patient, patient stated they would schedule for an appointment when they got back from their trip out of town. Dominique Russell

## 2021-11-02 NOTE — Telephone Encounter (Signed)
Patient needs an appointment for further refills.  Last office visit 05/17/2021  NO Show 06/29/2021  Future appointment 03/29/2022  15 - 30 day supply sent to the pharmacy. No refill.

## 2021-11-03 NOTE — Telephone Encounter (Signed)
[facility] pharmacy requesting med refill for omeprazole. Unable to send refill. Rx not listed in active med list.

## 2021-11-09 ENCOUNTER — Other Ambulatory Visit: Payer: Self-pay | Admitting: Medical-Surgical

## 2021-11-09 DIAGNOSIS — I10 Essential (primary) hypertension: Secondary | ICD-10-CM

## 2021-11-24 ENCOUNTER — Other Ambulatory Visit: Payer: Self-pay | Admitting: Medical-Surgical

## 2021-11-24 DIAGNOSIS — F339 Major depressive disorder, recurrent, unspecified: Secondary | ICD-10-CM

## 2021-11-25 NOTE — Telephone Encounter (Signed)
Last office visit 05/17/2021  Upcoming appointment 12/01/2021.  Is it okay to refill?

## 2021-11-28 ENCOUNTER — Other Ambulatory Visit: Payer: Self-pay | Admitting: Medical-Surgical

## 2021-11-28 DIAGNOSIS — F339 Major depressive disorder, recurrent, unspecified: Secondary | ICD-10-CM

## 2021-11-29 NOTE — Telephone Encounter (Signed)
Last office visit 05/17/2021  Last filled 11/02/2021  Upcoming appointment 12/01/2021

## 2021-12-01 ENCOUNTER — Encounter: Payer: Self-pay | Admitting: Medical-Surgical

## 2021-12-01 ENCOUNTER — Ambulatory Visit (INDEPENDENT_AMBULATORY_CARE_PROVIDER_SITE_OTHER): Payer: BC Managed Care – PPO | Admitting: Medical-Surgical

## 2021-12-01 VITALS — BP 105/69 | HR 102 | Resp 20 | Ht 64.0 in | Wt 239.1 lb

## 2021-12-01 DIAGNOSIS — Z23 Encounter for immunization: Secondary | ICD-10-CM | POA: Diagnosis not present

## 2021-12-01 DIAGNOSIS — R609 Edema, unspecified: Secondary | ICD-10-CM | POA: Diagnosis not present

## 2021-12-01 DIAGNOSIS — M1A9XX0 Chronic gout, unspecified, without tophus (tophi): Secondary | ICD-10-CM

## 2021-12-01 DIAGNOSIS — Z794 Long term (current) use of insulin: Secondary | ICD-10-CM | POA: Diagnosis not present

## 2021-12-01 DIAGNOSIS — G63 Polyneuropathy in diseases classified elsewhere: Secondary | ICD-10-CM

## 2021-12-01 DIAGNOSIS — M159 Polyosteoarthritis, unspecified: Secondary | ICD-10-CM | POA: Diagnosis not present

## 2021-12-01 DIAGNOSIS — Z6841 Body Mass Index (BMI) 40.0 and over, adult: Secondary | ICD-10-CM

## 2021-12-01 DIAGNOSIS — N1831 Chronic kidney disease, stage 3a: Secondary | ICD-10-CM

## 2021-12-01 DIAGNOSIS — I10 Essential (primary) hypertension: Secondary | ICD-10-CM

## 2021-12-01 DIAGNOSIS — E1169 Type 2 diabetes mellitus with other specified complication: Secondary | ICD-10-CM

## 2021-12-01 LAB — POCT GLYCOSYLATED HEMOGLOBIN (HGB A1C): HbA1c, POC (controlled diabetic range): 7.6 % — AB (ref 0.0–7.0)

## 2021-12-01 MED ORDER — OMEPRAZOLE 40 MG PO CPDR: 40.0000 mg | DELAYED_RELEASE_CAPSULE | Freq: Every day | ORAL | 0 refills | Status: DC

## 2021-12-01 NOTE — Progress Notes (Signed)
Established Patient Office Visit  Subjective   Patient ID: Dominique Russell, female   DOB: 22-Jan-1956 Age: 66 y.o. MRN: [id number]   Chief Complaint  Patient presents with   Medication Refill    HPI Pleasant 66 year old female presenting today for the following:  Gout: Has been taking allopurinol 300 mg daily and colchicine 0.6 mg daily for gout.  Notes that she has not had any recent gout type symptoms however felt that her lower extremity edema was part of having gout.  HTN: Does not regularly check her blood pressures. Taking hydrochlorothiazide 25 mg daily, tolerating well without side effects.  Does admit to adding salt to most of her foods although she admits that her daughter fusses at her quite a bit because of it.  Has significant lower extremity edema bilaterally.  Denies shortness of breath, chest pain, headaches, vision changes, palpitations, or syncopal spells.  GERD: Taking omeprazole 40 mg daily, tolerating well without side effects.  Notes that her reflux symptoms are well controlled with this medication.  Requesting refills.  Diabetes: Due for A1c recheck.  Last A1c was completed in early June with a result of 8.1%.  She is managed by endocrinology with [facility] but does not know when her next appointment should be.  According to the notes, she should have been seen in early September however she believes that she has had a another appointment since June with them and will check with her daughter.  She is on multiple psychiatric medications but her PHQ-9/GAD-7 scores are very high.  She admits to having thoughts of being better off dead nearly every day.  [facility] and reports that her psychiatrist is aware of these thoughts and feelings.  Denies any current plan for self-harm or suicide.  Denies homicidal ideation.   Objective:    Vitals:   12/01/21 1028  BP: 105/69  Pulse: (!) 102  Resp: 20  Height: 5\' 4"  (1.626 m)  Weight:  239 lb 1.9 oz (108.5 kg)  SpO2: 98%  BMI (Calculated): 41.02    Physical Exam Vitals and nursing note reviewed.  Constitutional:      General: She is not in acute distress.    Appearance: Normal appearance. She is obese. She is not ill-appearing.  HENT:     Head: Normocephalic and atraumatic.  Cardiovascular:     Rate and Rhythm: Normal rate and regular rhythm.     Pulses: Normal pulses.     Heart sounds: Normal heart sounds.  Pulmonary:     Effort: Pulmonary effort is normal. No respiratory distress.     Breath sounds: Normal breath sounds. No wheezing, rhonchi or rales.  Musculoskeletal:     Right lower leg: 4+ Pitting Edema (Ankle and foot) present.     Left lower leg: 4+ Pitting Edema (Ankle and foot) present.  Skin:    General: Skin is warm and dry.  Neurological:     Mental Status: She is alert and oriented to person, place, and time.  Psychiatric:        Mood and Affect: Mood normal.        Behavior: Behavior normal.        Thought Content: Thought content normal.        Judgment: Judgment normal.    Results for orders placed or performed in visit on 12/01/21 (from the past 24 hour(s))  POCT HgB A1C     Status: Abnormal   Collection Time: 12/01/21  11:20 AM  Result Value Ref Range   Hemoglobin A1C     HbA1c POC (<> result, manual entry)     HbA1c, POC (prediabetic range)     HbA1c, POC (controlled diabetic range) 7.6 (A) 0.0 - 7.0 %       The ASCVD Risk score (Arnett DK, et al., 2019) failed to calculate for the following reasons:   The valid total cholesterol range is 130 to 320 mg/dL   Assessment & Plan:   1. Type 2 diabetes mellitus with other specified complication, with long-term current use of insulin ([place]) Managed by endocrinology.  POCT hemoglobin A1c checked today with a result of 7.5%.  Advised to contact endocrinology as soon as possible for a follow-up appointment since it does appear that she is overdue. - POCT HgB A1C  2. Need for immunization  against influenza Flu vaccine given in office today. - Flu Vaccine QUAD High Dose(Fluad)  3. Class 3 severe obesity due to excess calories with serious comorbidity and body mass index (BMI) of 40.0 to 44.9 in adult [facility]) Discussed the importance of weight loss and increase mobility in the setting of chronic pain and osteoarthritis complications.  Recommend a lower calorie diet that is compliant with diabetic diet recommendations as well as daily activity as tolerated.  Increase activity slowly.  4. Essential hypertension Checking labs as below.  Blood pressure at goal.  Continue hydrochlorothiazide 25 mg daily. - CBC with Differential/Platelet - COMPLETE METABOLIC PANEL WITH GFR - Lipid panel  5. Peripheral edema Checking TSH and labs as above.  Suspect a perfect storm of venous insufficiency, immobility, and overuse of salt in the diet.  Discussed using compression socks placed first thing in the morning before hanging her feet/legs off the bed and worn throughout the day until she puts her legs up in the afternoon.  Strongly recommend limiting dietary sodium.  Discussed salt alternatives.  Reviewed venous insufficiency and immobility.  Ultimately she needs to get up and start moving a little more but weight loss would also help.  With her current medical concerns and deconditioning, suspect this will be quite difficult. - TSH  6. Generalized osteoarthritis of multiple sites Managed by the pain clinic.  7. Polyneuropathy associated with underlying disease ([place]) Taking Lyrica as prescribed.  Managed by the pain clinic.  8. Chronic gout without tophus, unspecified cause, unspecified site Checking labs as below including uric acid.  Continue allopurinol 300 mg daily.  Holding off on colchicine since her swelling is not gout related.  Would like to see how her uric acid results and if at normal levels, we will stick with allopurinol by itself.  If elevated, consider increasing allopurinol to  twice daily dosing. - CBC with Differential/Platelet - COMPLETE METABOLIC PANEL WITH GFR - Uric acid  Return in about 6 months (around 06/02/2022) for chronic disease follow up.  ___________________________________________ Clearnce Sorrel, DNP, APRN, FNP-BC [facility] [place]

## 2021-12-02 LAB — COMPLETE METABOLIC PANEL WITH GFR
AG Ratio: 1.5 (calc) (ref 1.0–2.5)
ALT: 22 U/L (ref 6–29)
AST: 28 U/L (ref 10–35)
Albumin: 4.1 g/dL (ref 3.6–5.1)
Alkaline phosphatase (APISO): 70 U/L (ref 37–153)
BUN/Creatinine Ratio: 22 (calc) (ref 6–22)
BUN: 28 mg/dL — ABNORMAL HIGH (ref 7–25)
CO2: 30 mmol/L (ref 20–32)
Calcium: 9.3 mg/dL (ref 8.6–10.4)
Chloride: 103 mmol/L (ref 98–110)
Creat: 1.26 mg/dL — ABNORMAL HIGH (ref 0.50–1.05)
Globulin: 2.8 g/dL (calc) (ref 1.9–3.7)
Glucose, Bld: 204 mg/dL — ABNORMAL HIGH (ref 65–99)
Potassium: 4.2 mmol/L (ref 3.5–5.3)
Sodium: 141 mmol/L (ref 135–146)
Total Bilirubin: 0.3 mg/dL (ref 0.2–1.2)
Total Protein: 6.9 g/dL (ref 6.1–8.1)
eGFR: 47 mL/min/{1.73_m2} — ABNORMAL LOW (ref >=60)

## 2021-12-02 LAB — CBC WITH DIFFERENTIAL/PLATELET
Absolute Monocytes: 546 cells/uL (ref 200–950)
Basophils Absolute: 22 cells/uL (ref 0–200)
Basophils Relative: 0.5 %
Eosinophils Absolute: 82 cells/uL (ref 15–500)
Eosinophils Relative: 1.9 %
HCT: 34.7 % — ABNORMAL LOW (ref 35.0–45.0)
Hemoglobin: 11.7 g/dL (ref 11.7–15.5)
Lymphs Abs: 1883 cells/uL (ref 850–3900)
MCH: 30.7 pg (ref 27.0–33.0)
MCHC: 33.7 g/dL (ref 32.0–36.0)
MCV: 91.1 fL (ref 80.0–100.0)
MPV: 11.2 fL (ref 7.5–12.5)
Monocytes Relative: 12.7 %
Neutro Abs: 1767 cells/uL (ref 1500–7800)
Neutrophils Relative %: 41.1 %
Platelets: 150 10*3/uL (ref 140–400)
RBC: 3.81 10*6/uL (ref 3.80–5.10)
RDW: 13.4 % (ref 11.0–15.0)
Total Lymphocyte: 43.8 %
WBC: 4.3 10*3/uL (ref 3.8–10.8)

## 2021-12-02 LAB — URIC ACID: Uric Acid, Serum: 5.3 mg/dL (ref 2.5–7.0)

## 2021-12-02 LAB — LIPID PANEL
Cholesterol: 138 mg/dL (ref <200)
HDL: 56 mg/dL (ref >=50)
LDL Cholesterol (Calc): 53 mg/dL (calc)
Non-HDL Cholesterol (Calc): 82 mg/dL (calc) (ref <130)
Total CHOL/HDL Ratio: 2.5 (calc) (ref <5.0)
Triglycerides: 232 mg/dL — ABNORMAL HIGH (ref <150)

## 2021-12-02 LAB — TSH: TSH: 1.08 mIU/L (ref 0.40–4.50)

## 2021-12-02 NOTE — Addendum Note (Signed)
Addended bySamuel Bouche on: 12/02/2021 05:41 PM   Modules accepted: Orders

## 2021-12-07 DIAGNOSIS — M19012 Primary osteoarthritis, left shoulder: Secondary | ICD-10-CM | POA: Diagnosis not present

## 2021-12-07 DIAGNOSIS — M797 Fibromyalgia: Secondary | ICD-10-CM | POA: Diagnosis not present

## 2021-12-07 DIAGNOSIS — M19011 Primary osteoarthritis, right shoulder: Secondary | ICD-10-CM | POA: Diagnosis not present

## 2021-12-07 DIAGNOSIS — M25562 Pain in left knee: Secondary | ICD-10-CM | POA: Diagnosis not present

## 2021-12-07 DIAGNOSIS — M542 Cervicalgia: Secondary | ICD-10-CM | POA: Diagnosis not present

## 2021-12-07 DIAGNOSIS — G609 Hereditary and idiopathic neuropathy, unspecified: Secondary | ICD-10-CM | POA: Diagnosis not present

## 2021-12-07 DIAGNOSIS — M549 Dorsalgia, unspecified: Secondary | ICD-10-CM | POA: Diagnosis not present

## 2021-12-07 DIAGNOSIS — M25561 Pain in right knee: Secondary | ICD-10-CM | POA: Diagnosis not present

## 2021-12-07 DIAGNOSIS — M170 Bilateral primary osteoarthritis of knee: Secondary | ICD-10-CM | POA: Diagnosis not present

## 2021-12-08 ENCOUNTER — Encounter: Payer: Self-pay | Admitting: Medical-Surgical

## 2021-12-16 DIAGNOSIS — N1831 Chronic kidney disease, stage 3a: Secondary | ICD-10-CM | POA: Diagnosis not present

## 2021-12-17 LAB — BASIC METABOLIC PANEL WITH GFR
BUN/Creatinine Ratio: 22 (calc) (ref 6–22)
BUN: 30 mg/dL — ABNORMAL HIGH (ref 7–25)
CO2: 27 mmol/L (ref 20–32)
Calcium: 9.6 mg/dL (ref 8.6–10.4)
Chloride: 104 mmol/L (ref 98–110)
Creat: 1.37 mg/dL — ABNORMAL HIGH (ref 0.50–1.05)
Glucose, Bld: 129 mg/dL (ref 65–139)
Potassium: 4.5 mmol/L (ref 3.5–5.3)
Sodium: 140 mmol/L (ref 135–146)
eGFR: 42 mL/min/{1.73_m2} — ABNORMAL LOW (ref >=60)

## 2021-12-20 ENCOUNTER — Telehealth: Payer: Self-pay

## 2021-12-20 NOTE — Telephone Encounter (Signed)
Attempted call to patient. Voice mail full. Could not leave a vm message.

## 2021-12-20 NOTE — Telephone Encounter (Signed)
Dominique Russell left a message requesting a referral to a kidney provider. Please advise.

## 2021-12-23 ENCOUNTER — Other Ambulatory Visit: Payer: Self-pay | Admitting: Medical-Surgical

## 2021-12-23 NOTE — Telephone Encounter (Signed)
Attemtped call to patient again. Could not leave voice mail . Automated system states voice mail is full.

## 2021-12-24 NOTE — Telephone Encounter (Signed)
Attempted call to patient again. Again could not leave a voice mail message.  Mailed letter to patient.

## 2021-12-30 DIAGNOSIS — F3181 Bipolar II disorder: Secondary | ICD-10-CM | POA: Diagnosis not present

## 2022-01-13 ENCOUNTER — Other Ambulatory Visit: Payer: Self-pay | Admitting: Medical-Surgical
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
## 2022-01-20 ENCOUNTER — Ambulatory Visit (INDEPENDENT_AMBULATORY_CARE_PROVIDER_SITE_OTHER): Payer: BC Managed Care – PPO | Admitting: Neurology

## 2022-01-20 ENCOUNTER — Encounter: Payer: Self-pay | Admitting: Neurology

## 2022-01-20 DIAGNOSIS — G250 Essential tremor: Secondary | ICD-10-CM

## 2022-01-20 MED ORDER — PRIMIDONE 50 MG PO TABS: 150.0000 mg | ORAL_TABLET | Freq: Two times a day (BID) | ORAL | 3 refills | Status: DC | PRN

## 2022-01-20 NOTE — Patient Instructions (Signed)
The physicians and staff at [facility] are committed to providing excellent care. You may receive a survey requesting feedback about your experience at our office. We strive to receive "very good" responses to the survey questions. If you feel that your experience would prevent you from giving the office a "very good " response, please contact our office to try to remedy the situation. We may be reached at [phone]. Thank you for taking the time out of your busy day to complete the survey.  

## 2022-01-24 IMAGING — US US EXTREM LOW VENOUS
1 series · 13 of 24 positions shown · non-contrast
Comparison: None.

CLINICAL DATA: Bilateral lower leg swelling

EXAM:
Bilateral LOWER EXTREMITY VENOUS DOPPLER ULTRASOUND
TECHNIQUE: Gray-scale sonography with compression, as well as color and duplex
ultrasound, were performed to evaluate the deep venous system(s)
from the level of the common femoral vein through the popliteal and
proximal calf veins.

[Series 1: us extrem low venous · 13 of 67 slices shown]
[im 1/67]
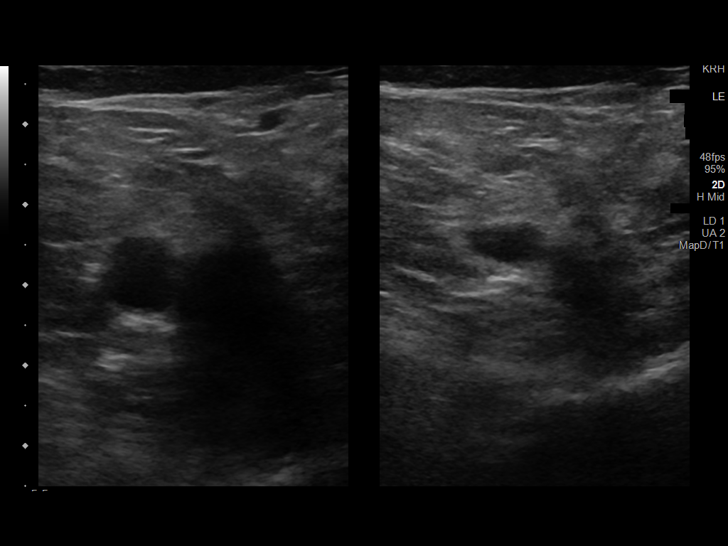
[im 6/67]
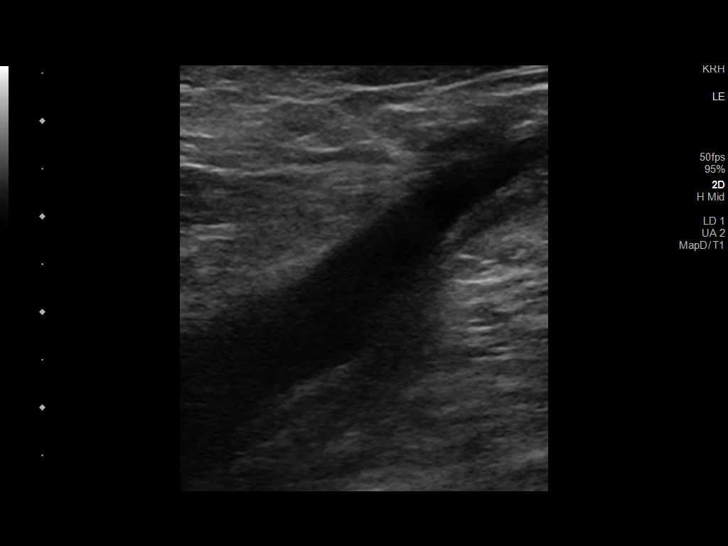
[im 12/67]
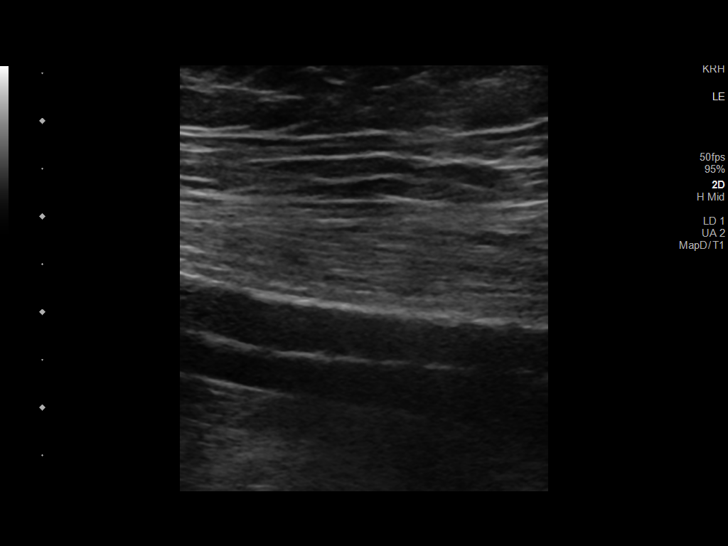
[im 18/67]
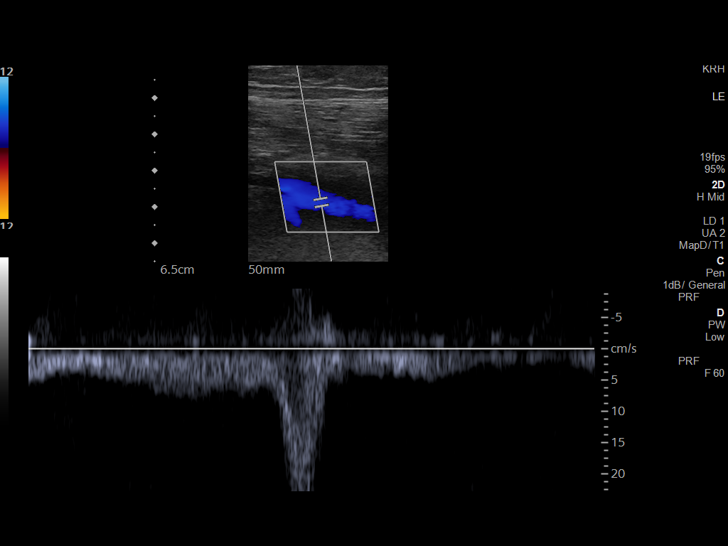
[im 23/67]
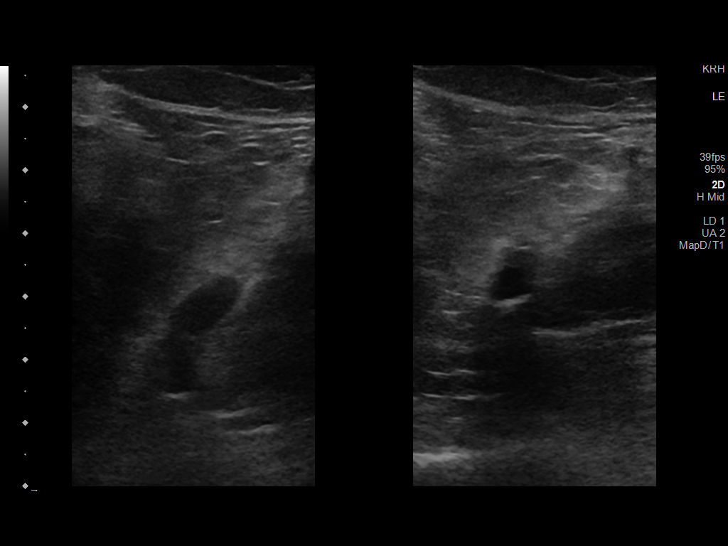
[im 29/67]
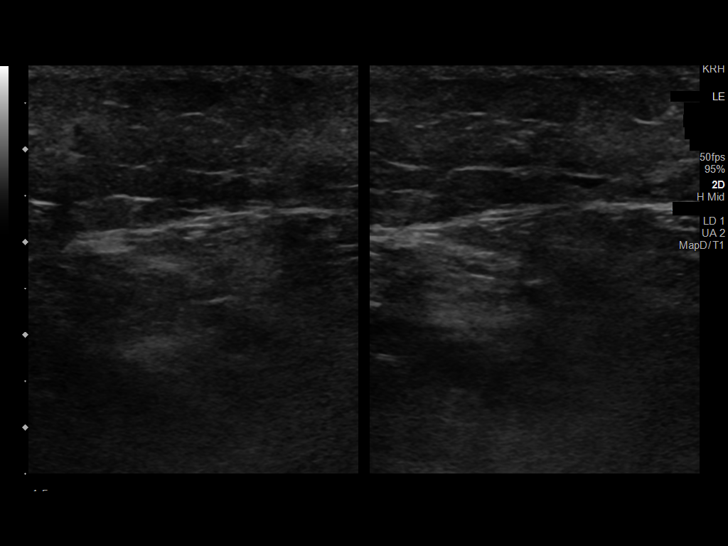
[im 35/67]
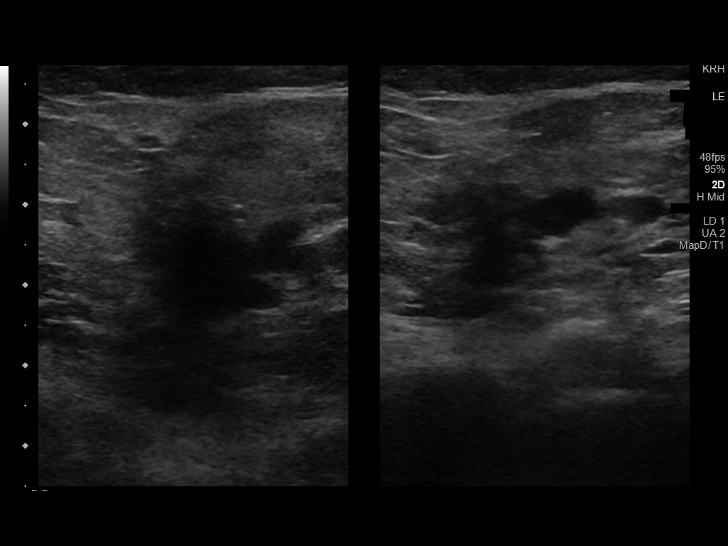
[im 38/67]
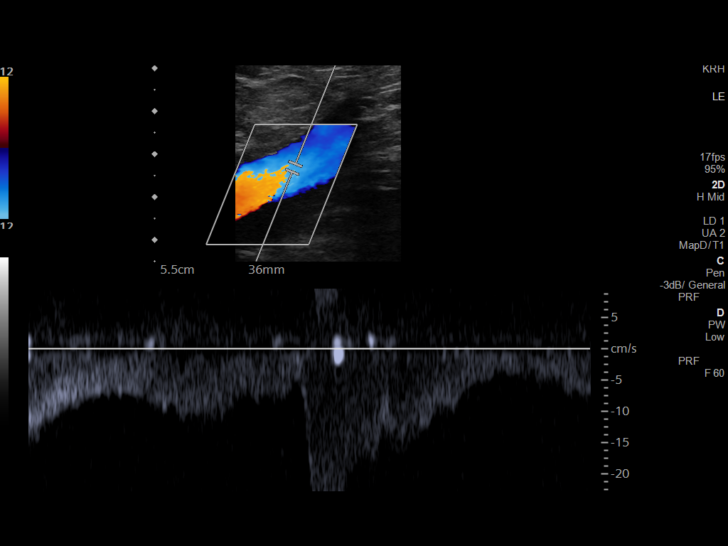
[im 44/67]
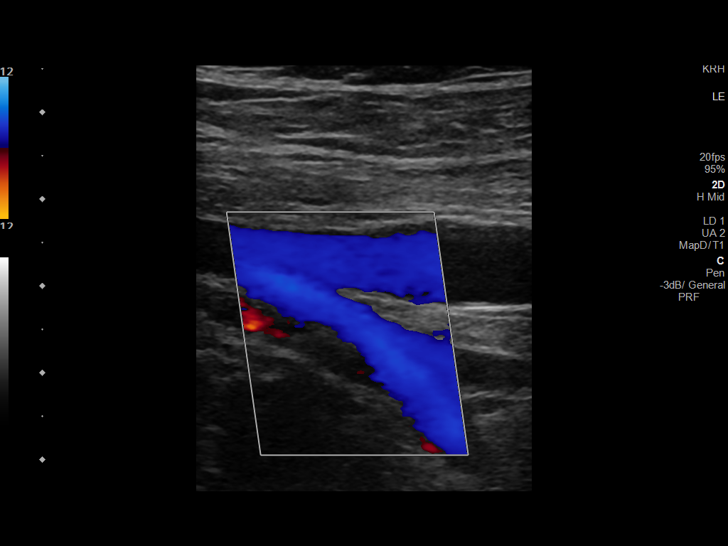
[im 49/67]
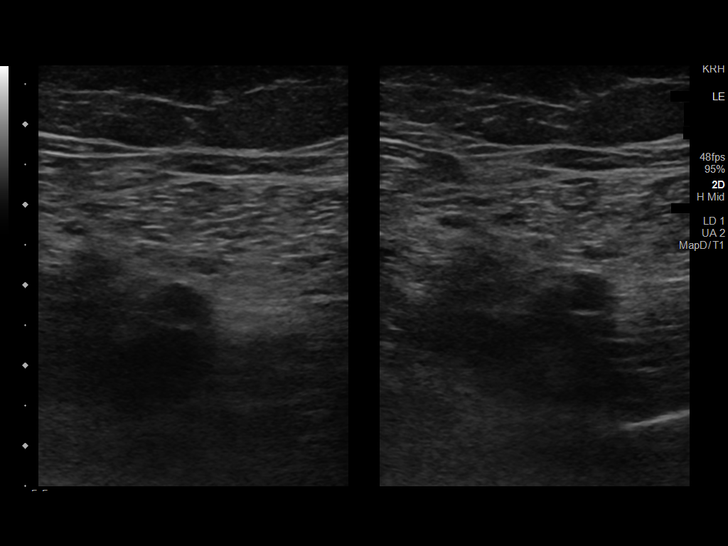
[im 55/67]
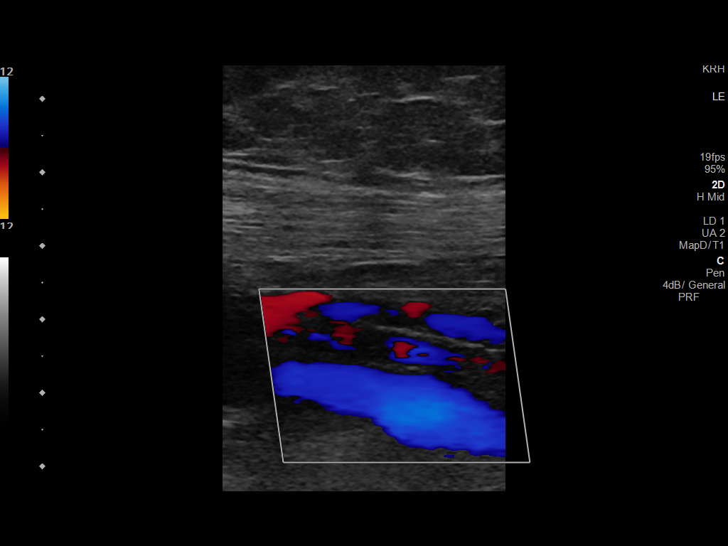
[im 61/67]
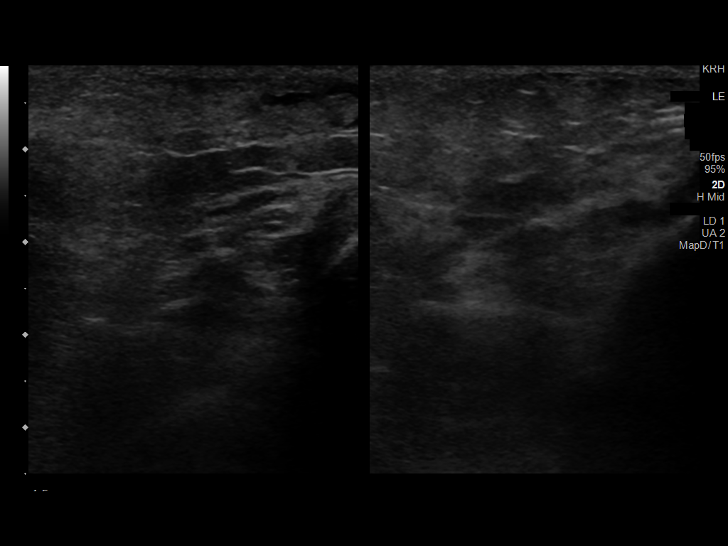
[im 67/67]
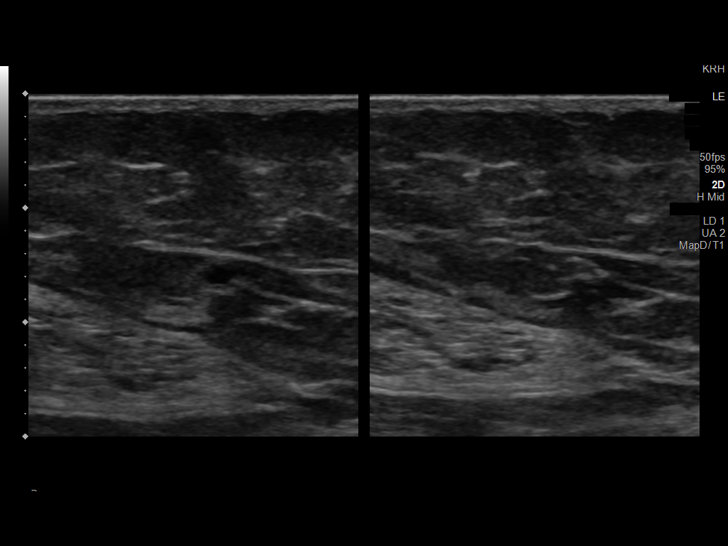

[13 of 24 positions shown; findings below may reference images not displayed]

FINDINGS: VENOUS

Normal compressibility of the common femoral, superficial femoral,
and popliteal veins, as well as the visualized calf veins. The
posterior tibial veins were seen in a limited fashion and appeared
unremarkable, the peroneal veins were not visualized on either side.
Visualized portions of profunda femoral vein and great saphenous
vein unremarkable. No filling defects to suggest DVT on grayscale or
color Doppler imaging. Doppler waveforms show normal direction of
venous flow, normal respiratory plasticity and response to
augmentation.

Limited views of the contralateral common femoral vein are
unremarkable.

OTHER

None.

Limitations: none
IMPRESSION: 1. No findings of lower extremity deep vein thrombosis.
2. Please note that the peroneal veins were not well seen on either
side. The posterior tibial veins in the calf demonstrate
compressibility but are only seen in a limited fashion.

## 2022-01-25 ENCOUNTER — Encounter: Payer: Self-pay | Admitting: Medical-Surgical

## 2022-01-25 DIAGNOSIS — N1831 Chronic kidney disease, stage 3a: Secondary | ICD-10-CM

## 2022-02-22 ENCOUNTER — Ambulatory Visit: Payer: BC Managed Care – PPO | Admitting: Medical-Surgical

## 2022-02-28 ENCOUNTER — Other Ambulatory Visit: Payer: Self-pay | Admitting: Medical-Surgical

## 2022-03-08 DIAGNOSIS — M170 Bilateral primary osteoarthritis of knee: Secondary | ICD-10-CM | POA: Diagnosis not present

## 2022-03-08 DIAGNOSIS — M25562 Pain in left knee: Secondary | ICD-10-CM | POA: Diagnosis not present

## 2022-03-08 DIAGNOSIS — M797 Fibromyalgia: Secondary | ICD-10-CM | POA: Diagnosis not present

## 2022-03-08 DIAGNOSIS — M19012 Primary osteoarthritis, left shoulder: Secondary | ICD-10-CM | POA: Diagnosis not present

## 2022-03-08 DIAGNOSIS — G609 Hereditary and idiopathic neuropathy, unspecified: Secondary | ICD-10-CM | POA: Diagnosis not present

## 2022-03-08 DIAGNOSIS — M19011 Primary osteoarthritis, right shoulder: Secondary | ICD-10-CM | POA: Diagnosis not present

## 2022-03-08 DIAGNOSIS — M25561 Pain in right knee: Secondary | ICD-10-CM | POA: Diagnosis not present

## 2022-03-08 DIAGNOSIS — M542 Cervicalgia: Secondary | ICD-10-CM | POA: Diagnosis not present

## 2022-03-08 DIAGNOSIS — M545 Low back pain, unspecified: Secondary | ICD-10-CM | POA: Diagnosis not present

## 2022-03-08 DIAGNOSIS — G8929 Other chronic pain: Secondary | ICD-10-CM | POA: Diagnosis not present

## 2022-03-18 ENCOUNTER — Ambulatory Visit: Payer: 59 | Admitting: Medical-Surgical

## 2022-03-21 ENCOUNTER — Ambulatory Visit: Payer: 59 | Admitting: Medical-Surgical

## 2022-03-24 ENCOUNTER — Other Ambulatory Visit: Payer: Self-pay | Admitting: Medical-Surgical

## 2022-03-24 ENCOUNTER — Encounter: Payer: Self-pay | Admitting: Medical-Surgical

## 2022-03-24 ENCOUNTER — Ambulatory Visit (INDEPENDENT_AMBULATORY_CARE_PROVIDER_SITE_OTHER): Payer: 59 | Admitting: Medical-Surgical

## 2022-03-24 VITALS — BP 128/75 | HR 98 | Resp 20 | Ht 64.0 in | Wt 238.0 lb

## 2022-03-24 DIAGNOSIS — I10 Essential (primary) hypertension: Secondary | ICD-10-CM | POA: Diagnosis not present

## 2022-03-24 DIAGNOSIS — Z794 Long term (current) use of insulin: Secondary | ICD-10-CM

## 2022-03-24 DIAGNOSIS — M5441 Lumbago with sciatica, right side: Secondary | ICD-10-CM

## 2022-03-24 DIAGNOSIS — N1831 Chronic kidney disease, stage 3a: Secondary | ICD-10-CM

## 2022-03-24 DIAGNOSIS — E1169 Type 2 diabetes mellitus with other specified complication: Secondary | ICD-10-CM

## 2022-03-24 LAB — POCT GLYCOSYLATED HEMOGLOBIN (HGB A1C): Hemoglobin A1C: 9.9 % — AB (ref 4.0–5.6)

## 2022-03-24 NOTE — Progress Notes (Signed)
Established Patient Office Visit  Subjective   Patient ID: Dominique Russell, female   DOB: 1955/05/09 Age: 67 y.o. MRN: [id number]   Chief Complaint  Patient presents with   [place]    HPI Pleasant 67 year old female accompanied by her daughter presenting today for the following:  Falls: has been falling frequently over the last few months. Her legs are weak and she has difficulty with certain activities such as getting up from chairs and walking long periods of time. She was using a cane to help with her balance but it is broken and she is unable to use it now. When she falls, she is unable to get up by herself. Wants a referral to physical therapy. Has a borrowed walker at home but doesn't like to use it when leaving the house.   Continues to worry about her kidney function. Wants to have it rechecked today. Reports she previously saw a nephrologist but hasn't been in a long time. Was not aware that blood pressure and glucose control will affect kidneys too.   Sees endocrinology for her diabetes care. Overdue for follow up with them.    Objective:    Vitals:   03/24/22 1002 03/24/22 1113  BP: (!) 154/79 128/75  Pulse: (!) 106 98  Resp: 20 20  Height: 5\' 4"  (1.626 m)   Weight: 238 lb (108 kg)   SpO2: 95% 95%  BMI (Calculated): 40.83     Physical Exam Vitals and nursing note reviewed.  Constitutional:      General: She is not in acute distress.    Appearance: Normal appearance. She is obese. She is not ill-appearing.  HENT:     Head: Normocephalic and atraumatic.  Cardiovascular:     Rate and Rhythm: Normal rate and regular rhythm.     Pulses: Normal pulses.     Heart sounds: Normal heart sounds.  Pulmonary:     Effort: Pulmonary effort is normal. No respiratory distress.     Breath sounds: Normal breath sounds. No wheezing, rhonchi or rales.  Skin:    General: Skin is warm and dry.  Neurological:     Mental Status: She is alert and oriented to  person, place, and time.  Psychiatric:        Mood and Affect: Mood normal.        Behavior: Behavior normal.        Thought Content: Thought content normal.        Judgment: Judgment normal.    No results found for this or any previous visit (from the past 24 hour(s)).      The 10-year ASCVD risk score (Arnett DK, et al., 2019) is: 18%   Values used to calculate the score:     Age: 51 years     Sex: Female     Is Non-Hispanic African American: Yes     Diabetic: Yes     Tobacco smoker: No     Systolic Blood Pressure: 347 mmHg     Is BP treated: Yes     HDL Cholesterol: 58 mg/dL     Total Cholesterol: 143 mg/dL   Assessment & Plan:   1. Type 2 diabetes mellitus with other specified complication, with long-term current use of insulin (HCC) POCT A1c  7.6% 3 months ago but now up to 9.9%. Strongly recommend she get in touch with her Endocrinologist for an appointment for medication adjustment. Discussed the importance of better management for glucose when  concerned about kidney disease.  - POCT HgB A1C  2. Stage 3a chronic kidney disease (HCC) Rechecking kidney function. - BASIC METABOLIC PANEL WITH GFR  3. Essential hypertension Checking labs. BP elevated on arrival, better on recheck. Continue current medications. Recommend reducing salt intake and consider substituting other spices in it's place to make food palatable.  - CBC - Lipid panel  Return in about 6 weeks (around 05/05/2022) for follow up on falls.  ___________________________________________ Clearnce Sorrel, DNP, APRN, FNP-BC [facility] [place]

## 2022-03-25 LAB — LIPID PANEL
Cholesterol: 143 mg/dL (ref <200)
HDL: 58 mg/dL (ref >=50)
LDL Cholesterol (Calc): 62 mg/dL (calc)
Non-HDL Cholesterol (Calc): 85 mg/dL (calc) (ref <130)
Total CHOL/HDL Ratio: 2.5 (calc) (ref <5.0)
Triglycerides: 145 mg/dL (ref <150)

## 2022-03-25 LAB — CBC
HCT: 37.2 % (ref 35.0–45.0)
Hemoglobin: 12.3 g/dL (ref 11.7–15.5)
MCH: 29.6 pg (ref 27.0–33.0)
MCHC: 33.1 g/dL (ref 32.0–36.0)
MCV: 89.6 fL (ref 80.0–100.0)
MPV: 11 fL (ref 7.5–12.5)
Platelets: 194 10*3/uL (ref 140–400)
RBC: 4.15 10*6/uL (ref 3.80–5.10)
RDW: 13.5 % (ref 11.0–15.0)
WBC: 5.6 10*3/uL (ref 3.8–10.8)

## 2022-03-26 MED ORDER — QUAD CANE/SMALL BASE MISC: 0 refills | Status: DC

## 2022-03-26 MED ORDER — AMBULATORY NON FORMULARY MEDICATION: 0 refills | Status: DC

## 2022-03-31 DIAGNOSIS — F3181 Bipolar II disorder: Secondary | ICD-10-CM | POA: Diagnosis not present

## 2022-04-01 DIAGNOSIS — N1831 Chronic kidney disease, stage 3a: Secondary | ICD-10-CM | POA: Diagnosis not present

## 2022-04-02 LAB — BASIC METABOLIC PANEL WITH GFR
BUN: 23 mg/dL (ref 7–25)
CO2: 25 mmol/L (ref 20–32)
Calcium: 9 mg/dL (ref 8.6–10.4)
Chloride: 102 mmol/L (ref 98–110)
Creat: 0.99 mg/dL (ref 0.50–1.05)
Glucose, Bld: 179 mg/dL — ABNORMAL HIGH (ref 65–99)
Potassium: 4.6 mmol/L (ref 3.5–5.3)
Sodium: 138 mmol/L (ref 135–146)
eGFR: 62 mL/min/{1.73_m2} (ref >=60)

## 2022-04-05 ENCOUNTER — Encounter: Payer: Self-pay | Admitting: Medical-Surgical

## 2022-04-05 DIAGNOSIS — Z7409 Other reduced mobility: Secondary | ICD-10-CM | POA: Diagnosis not present

## 2022-04-05 DIAGNOSIS — M5441 Lumbago with sciatica, right side: Secondary | ICD-10-CM

## 2022-04-05 DIAGNOSIS — R2689 Other abnormalities of gait and mobility: Secondary | ICD-10-CM | POA: Diagnosis not present

## 2022-04-05 MED ORDER — AMBULATORY NON FORMULARY MEDICATION: 0 refills | Status: DC

## 2022-04-05 MED ORDER — QUAD CANE/SMALL BASE MISC: 0 refills | Status: DC

## 2022-04-06 NOTE — Progress Notes (Signed)
The prescription for the walker has been faxed.

## 2022-04-08 ENCOUNTER — Other Ambulatory Visit: Payer: Self-pay | Admitting: Physician Assistant

## 2022-04-08 ENCOUNTER — Other Ambulatory Visit: Payer: Self-pay | Admitting: Medical-Surgical

## 2022-04-08 DIAGNOSIS — I10 Essential (primary) hypertension: Secondary | ICD-10-CM

## 2022-04-19 ENCOUNTER — Other Ambulatory Visit: Payer: Self-pay | Admitting: Medical-Surgical

## 2022-04-19 DIAGNOSIS — E1142 Type 2 diabetes mellitus with diabetic polyneuropathy: Secondary | ICD-10-CM | POA: Diagnosis not present

## 2022-04-19 DIAGNOSIS — Z794 Long term (current) use of insulin: Secondary | ICD-10-CM | POA: Diagnosis not present

## 2022-04-21 DIAGNOSIS — R2689 Other abnormalities of gait and mobility: Secondary | ICD-10-CM | POA: Diagnosis not present

## 2022-04-21 DIAGNOSIS — Z7409 Other reduced mobility: Secondary | ICD-10-CM | POA: Diagnosis not present

## 2022-04-28 ENCOUNTER — Other Ambulatory Visit: Payer: Self-pay | Admitting: Medical-Surgical

## 2022-04-28 DIAGNOSIS — R296 Repeated falls: Secondary | ICD-10-CM | POA: Diagnosis not present

## 2022-04-28 DIAGNOSIS — Z6841 Body Mass Index (BMI) 40.0 and over, adult: Secondary | ICD-10-CM | POA: Diagnosis not present

## 2022-04-28 DIAGNOSIS — R2689 Other abnormalities of gait and mobility: Secondary | ICD-10-CM | POA: Diagnosis not present

## 2022-04-28 DIAGNOSIS — G63 Polyneuropathy in diseases classified elsewhere: Secondary | ICD-10-CM | POA: Diagnosis not present

## 2022-05-04 DIAGNOSIS — R2689 Other abnormalities of gait and mobility: Secondary | ICD-10-CM | POA: Diagnosis not present

## 2022-05-04 DIAGNOSIS — Z6841 Body Mass Index (BMI) 40.0 and over, adult: Secondary | ICD-10-CM | POA: Diagnosis not present

## 2022-05-04 DIAGNOSIS — R296 Repeated falls: Secondary | ICD-10-CM | POA: Diagnosis not present

## 2022-05-04 DIAGNOSIS — G63 Polyneuropathy in diseases classified elsewhere: Secondary | ICD-10-CM | POA: Diagnosis not present

## 2022-05-05 ENCOUNTER — Ambulatory Visit (INDEPENDENT_AMBULATORY_CARE_PROVIDER_SITE_OTHER): Payer: 59 | Admitting: Medical-Surgical

## 2022-05-05 ENCOUNTER — Encounter: Payer: Self-pay | Admitting: Medical-Surgical

## 2022-05-05 VITALS — BP 130/65 | HR 94 | Resp 20 | Ht 64.0 in | Wt 241.1 lb

## 2022-05-05 DIAGNOSIS — I872 Venous insufficiency (chronic) (peripheral): Secondary | ICD-10-CM | POA: Diagnosis not present

## 2022-05-05 DIAGNOSIS — R296 Repeated falls: Secondary | ICD-10-CM

## 2022-05-05 DIAGNOSIS — M5441 Lumbago with sciatica, right side: Secondary | ICD-10-CM

## 2022-05-05 DIAGNOSIS — K219 Gastro-esophageal reflux disease without esophagitis: Secondary | ICD-10-CM

## 2022-05-05 MED ORDER — QUAD CANE/SMALL BASE MISC: 0 refills | Status: DC

## 2022-05-05 MED ORDER — AMBULATORY NON FORMULARY MEDICATION: 0 refills | Status: DC

## 2022-05-05 NOTE — Patient Instructions (Signed)
Foot swelling: keep your feet elevated when you are sitting down. Wear compression socks during the day. Cut back on salt.   We are faxing the prescription for the cane and walker again.   Keep doing physical therapy!!!  Come back in two months to recheck A1c for diabetes.   I put a referral in for GI to evaluate reflux and the white around your mouth when you wake up. Be on the lookout for a call to get you scheduled for an appointment.

## 2022-05-05 NOTE — Progress Notes (Signed)
Established Patient Office Visit  Subjective   Patient ID: Dominique Russell, female   DOB: March 13, 1955 Age: 67 y.o. MRN: [id number]   Chief Complaint  Patient presents with   Follow-up   Fall   Gout   HPI Pleasant 67 year old female presenting today for the following:  Falls: reports that she has not had any further falls since her visit here 6 weeks ago. She is using her quad cane to walk with for extra balance. This seems to work well however she wants a walker since that is what she is using with her PT appointments. A prescription was written for this and faxed to [place] at her request last month but she hasn't heard anything.   Back pain: since starting physical therapy, her back pain has improved. She still has periods where the discomfort is worse than others but it is better overall.   GERD: history of reflux. Taking Omeprazole 40mg  daily. Also taking famotidine 20mg  BID. Waking up with a chalky white residue on her mouth/lips each morning. Thinks it is because of reflux.   Leg/foot swelling: has continued to note swelling of the tops of her feet, ankles, and lower legs bilaterally. Thought it was a gout flare but there is no redness or pain involved. Better in the morning, worse as the day goes on. Has compression socks but they are too tight to get on. Sitting most of the day with her legs dependent.    Objective:    Vitals:   05/05/22 1027  BP: 130/65  Pulse: 94  Resp: 20  Height: 5\' 4"  (1.626 m)  Weight: 241 lb 1.3 oz (109.4 kg)  SpO2: 96%  BMI (Calculated): 41.36   Physical Exam Vitals reviewed.  Constitutional:      General: She is not in acute distress.    Appearance: Normal appearance. She is obese. She is not ill-appearing.  HENT:     Head: Normocephalic and atraumatic.  Cardiovascular:     Rate and Rhythm: Normal rate and regular rhythm.     Pulses: Normal pulses.     Heart sounds: Normal heart sounds.  Pulmonary:     Effort: Pulmonary effort is  normal. No respiratory distress.     Breath sounds: Normal breath sounds. No wheezing, rhonchi or rales.  Musculoskeletal:     Right lower leg: Edema present.     Left lower leg: Edema present.  Skin:    General: Skin is warm and dry.  Neurological:     Mental Status: She is alert and oriented to person, place, and time.  Psychiatric:        Mood and Affect: Mood normal.        Behavior: Behavior normal.        Thought Content: Thought content normal.        Judgment: Judgment normal.   No results found for this or any previous visit (from the past 24 hour(s)).     The 10-year ASCVD risk score (Arnett DK, et al., 2019) is: 18.6%   Values used to calculate the score:     Age: 32 years     Sex: Female     Is Non-Hispanic African American: Yes     Diabetic: Yes     Tobacco smoker: No     Systolic Blood Pressure: [id number]     Is BP treated: Yes     HDL Cholesterol: 58 mg/dL     Total Cholesterol: 143 mg/dL   Assessment &  Plan:   1. Acute right-sided low back pain with right-sided sciatica Continue to use the quad cane when ambulating. Prescription for walker printed again to be faxed. Improved with physical therapy, plan to continue as instructed.  - Misc. Devices (QUAD CANE/SMALL BASE) MISC; Use daily  Dispense: 1 each; Refill: 0  2. Frequent falls See above.   3. Gastroesophageal reflux disease, unspecified whether esophagitis present Unclear etiology. Already on a PPI and H2 blocker. Referring to GI for further investigation.  - Ambulatory referral to Gastroenterology  4. Venous insufficiency Discussed venous insufficiency and conservative  measures to manage. Recommend sodium limitation, elevation, regular exercise, and compression socks/stockings.   Return in about 2 months (around 07/05/2022) for DM follow up.  ___________________________________________ Clearnce Sorrel, DNP, APRN, FNP-BC [facility] and [place]

## 2022-05-06 DIAGNOSIS — Z7409 Other reduced mobility: Secondary | ICD-10-CM | POA: Insufficient documentation

## 2022-05-06 MED ORDER — FAMOTIDINE 20 MG PO TABS: 20.0000 mg | ORAL_TABLET | Freq: Two times a day (BID) | ORAL | 1 refills | Status: DC

## 2022-05-08 ENCOUNTER — Other Ambulatory Visit: Payer: Self-pay | Admitting: Medical-Surgical

## 2022-05-17 ENCOUNTER — Other Ambulatory Visit: Payer: Self-pay | Admitting: Medical-Surgical

## 2022-05-17 DIAGNOSIS — Z6841 Body Mass Index (BMI) 40.0 and over, adult: Secondary | ICD-10-CM | POA: Diagnosis not present

## 2022-05-17 DIAGNOSIS — R2689 Other abnormalities of gait and mobility: Secondary | ICD-10-CM | POA: Diagnosis not present

## 2022-05-17 DIAGNOSIS — I10 Essential (primary) hypertension: Secondary | ICD-10-CM

## 2022-05-17 DIAGNOSIS — R296 Repeated falls: Secondary | ICD-10-CM | POA: Diagnosis not present

## 2022-05-17 DIAGNOSIS — G63 Polyneuropathy in diseases classified elsewhere: Secondary | ICD-10-CM | POA: Diagnosis not present

## 2022-05-30 ENCOUNTER — Other Ambulatory Visit: Payer: Self-pay | Admitting: Pharmacist

## 2022-05-30 NOTE — Progress Notes (Signed)
Patient appearing on report for quality metrics: med adherence cholesterol (MAC) and med adherence hypertension [facility]).  Outreached patient to discuss medication management as well as offer support. Left voicemail for patient to return my call at their convenience.  Lynnda Shields, PharmD, BCPS Clinical Pharmacist [facility]

## 2022-05-31 DIAGNOSIS — R1310 Dysphagia, unspecified: Secondary | ICD-10-CM | POA: Diagnosis not present

## 2022-05-31 DIAGNOSIS — M542 Cervicalgia: Secondary | ICD-10-CM | POA: Diagnosis not present

## 2022-05-31 DIAGNOSIS — M19012 Primary osteoarthritis, left shoulder: Secondary | ICD-10-CM | POA: Diagnosis not present

## 2022-05-31 DIAGNOSIS — M170 Bilateral primary osteoarthritis of knee: Secondary | ICD-10-CM | POA: Diagnosis not present

## 2022-05-31 DIAGNOSIS — M797 Fibromyalgia: Secondary | ICD-10-CM | POA: Diagnosis not present

## 2022-05-31 DIAGNOSIS — K219 Gastro-esophageal reflux disease without esophagitis: Secondary | ICD-10-CM | POA: Diagnosis not present

## 2022-05-31 DIAGNOSIS — M25562 Pain in left knee: Secondary | ICD-10-CM | POA: Diagnosis not present

## 2022-05-31 DIAGNOSIS — M19011 Primary osteoarthritis, right shoulder: Secondary | ICD-10-CM | POA: Diagnosis not present

## 2022-05-31 DIAGNOSIS — G609 Hereditary and idiopathic neuropathy, unspecified: Secondary | ICD-10-CM | POA: Diagnosis not present

## 2022-05-31 DIAGNOSIS — M25561 Pain in right knee: Secondary | ICD-10-CM | POA: Diagnosis not present

## 2022-05-31 DIAGNOSIS — M545 Low back pain, unspecified: Secondary | ICD-10-CM | POA: Diagnosis not present

## 2022-05-31 DIAGNOSIS — G8929 Other chronic pain: Secondary | ICD-10-CM | POA: Diagnosis not present

## 2022-06-07 DIAGNOSIS — F3181 Bipolar II disorder: Secondary | ICD-10-CM | POA: Diagnosis not present

## 2022-06-09 DIAGNOSIS — K224 Dyskinesia of esophagus: Secondary | ICD-10-CM | POA: Diagnosis not present

## 2022-06-09 DIAGNOSIS — K219 Gastro-esophageal reflux disease without esophagitis: Secondary | ICD-10-CM | POA: Diagnosis not present

## 2022-06-09 DIAGNOSIS — R1310 Dysphagia, unspecified: Secondary | ICD-10-CM | POA: Diagnosis not present

## 2022-06-27 ENCOUNTER — Telehealth: Payer: Self-pay | Admitting: Medical-Surgical

## 2022-06-27 NOTE — Telephone Encounter (Signed)
Contacted [facility] to schedule their annual wellness visit. Appointment made for 06/28/2022.  Cira Servant Patient Engineer, production II Direct Dial: [phone]

## 2022-06-28 ENCOUNTER — Ambulatory Visit (INDEPENDENT_AMBULATORY_CARE_PROVIDER_SITE_OTHER): Payer: BC Managed Care – PPO | Admitting: Medical-Surgical

## 2022-06-28 DIAGNOSIS — Z0000 Encounter for general adult medical examination without abnormal findings: Secondary | ICD-10-CM | POA: Diagnosis not present

## 2022-06-28 NOTE — Progress Notes (Signed)
MEDICARE ANNUAL WELLNESS VISIT  06/28/2022  Telephone Visit Disclaimer This Medicare AWV was conducted by telephone due to national recommendations for restrictions regarding the COVID-19 Pandemic (e.g. social distancing).  I verified, using two identifiers, that I am speaking with Dominique Russell or their authorized healthcare agent. I discussed the limitations, risks, security, and privacy concerns of performing an evaluation and management service by telephone and the potential availability of an in-person appointment in the future. The patient expressed understanding and agreed to proceed.  Location of Patient: Home Location of Provider (nurse):  In the office.  Subjective:    Dominique Russell is a 67 y.o. female patient of Christen Butter, NP who had a Medicare Annual Wellness Visit today via telephone. Dominique Russell is Retired and lives with their daughter. she has 2 children. she reports that she is socially active and does interact with friends/family regularly. she is moderately physically active and enjoys spending time with family, watching television and going to church.  Patient Care Team: Christen Butter, NP as PCP - General (Nurse Practitioner) Tat, Octaviano Batty, DO as Consulting Physician (Neurology)     06/28/2022    9:19 AM 03/26/2021   10:11 AM 03/03/2021   11:39 AM 01/20/2021   10:42 AM 05/20/2020   10:14 AM 02/04/2020    9:32 AM 08/21/2019   11:09 AM  Advanced Directives  Does Patient Have a Medical Advance Directive? No No Yes No No No No  Would patient like information on creating a medical advance directive? No - Patient declined No - Patient declined    No - Patient declined     Hospital Utilization Over the Past 12 Months: # of hospitalizations or ER visits: 1 # of surgeries: 0  Review of Systems    Patient reports that her overall health is worse compared to last year.  History obtained from chart review and the patient  Patient Reported Readings (BP, Pulse, CBG,  Weight, etc) none  Pain Assessment Pain : No/denies pain     Current Medications & Allergies (verified) Allergies as of 06/28/2022       Reactions   Amlodipine Other (See Comments)   Causes her to have shakes.        Medication List        Accurate as of Jun 28, 2022  9:35 AM. If you have any questions, ask your nurse or doctor.          acetaminophen 650 MG CR tablet Commonly known as: TYLENOL Take 650 mg by mouth every 8 (eight) hours as needed for pain.   allopurinol 300 MG tablet Commonly known as: ZYLOPRIM TAKE 1 TABLET BY MOUTH EVERY DAY   ALPRAZolam 1 MG tablet Commonly known as: XANAX Take by mouth.   AMBULATORY NON FORMULARY MEDICATION Please dispense an adjustable height Rollator walker with brakes   B-D UF III MINI PEN NEEDLES 31G X 5 MM Misc Generic drug: Insulin Pen Needle USE AS DIRECTED WITH INSULIN AND VICTOZA.   clobetasol ointment 0.05 % Commonly known as: TEMOVATE Apply 1 application topically 2 (two) times daily. To affected area(s) as needed, max 2-3 weeks to avoid whitening/thinning skin   colchicine 0.6 MG tablet TAKE 1 TABLET BY MOUTH EVERY DAY/ NEEDS APPT   cyanocobalamin 1000 MCG tablet Commonly known as: VITAMIN B12 Take 1,000 mcg by mouth daily.   cyclobenzaprine 10 MG tablet Commonly known as: FLEXERIL TAKE 0.5-1 TABLETS (5-10 MG TOTAL) BY MOUTH 3 (THREE) TIMES DAILY AS NEEDED  FOR MUSCLE SPASMS. CAUTION: CAN CAUSE DROWSINESS   diclofenac sodium 1 % Gel Commonly known as: VOLTAREN Place onto the skin.   DULoxetine 60 MG capsule Commonly known as: CYMBALTA Take 1 capsule (60 mg total) by mouth daily.   famotidine 20 MG tablet Commonly known as: PEPCID Take 1 tablet (20 mg total) by mouth 2 (two) times daily.   Fish Oil 1000 MG Caps Take 1 capsule by mouth daily.   fluticasone 50 MCG/ACT nasal spray Commonly known as: FLONASE Place 2 sprays into both nostrils daily.   folic acid 800 MCG tablet Commonly known as:  FOLVITE Take 400 mcg by mouth daily.   HumuLIN N KwikPen 100 UNIT/ML Kiwkpen Generic drug: Insulin NPH (Human) (Isophane) SMARTSIG:20 Unit(s) SUB-Q Morning-Night   hydrochlorothiazide 25 MG tablet Commonly known as: HYDRODIURIL TAKE 1 TABLET (25 MG TOTAL) BY MOUTH DAILY.   insulin aspart protamine- aspart (70-30) 100 UNIT/ML injection Commonly known as: NOVOLOG MIX 70/30 Inject into the skin.   ipratropium 0.06 % nasal spray Commonly known as: ATROVENT Place 2 sprays into both nostrils 4 (four) times daily.   Lyrica 150 MG capsule Generic drug: pregabalin Take 1 tablet by mouth 2 (two) times daily.   metFORMIN 500 MG 24 hr tablet Commonly known as: GLUCOPHAGE-XR Take 500 mg by mouth every morning.   mirabegron ER 50 MG Tb24 tablet Commonly known as: Myrbetriq Take 1 tablet (50 mg total) by mouth daily.   mirtazapine 15 MG tablet Commonly known as: REMERON Take 15 mg by mouth at bedtime.   omeprazole 40 MG capsule Commonly known as: PRILOSEC TAKE 1 CAPSULE (40 MG TOTAL) BY MOUTH DAILY.   ONE TOUCH ULTRA TEST test strip Generic drug: glucose blood   primidone 50 MG tablet Commonly known as: MYSOLINE Take 3 tablets (150 mg total) by mouth 2 (two) times daily as needed.   Quad [employer] Misc Use daily   rosuvastatin 20 MG tablet Commonly known as: CRESTOR TAKE 1 TABLET BY MOUTH EVERY DAY   scopolamine 1 MG/3DAYS Commonly known as: TRANSDERM-SCOP Place 1 patch (1.5 mg total) onto the skin every 3 (three) days.   traZODone 150 MG tablet Commonly known as: DESYREL Take 1 tablet (150 mg total) by mouth at bedtime. NEEDS APPOINTMENT FOR FURTHER REFILLS.   valsartan 320 MG tablet Commonly known as: DIOVAN Take 1 tablet (320 mg total) by mouth daily.   Vitamin D-3 25 MCG (1000 UT) Caps Take 2 capsules (2,000 Units total) by mouth daily.   Radiance Platinum Vitamin D3 125 MCG (5000 UT) Tabs Generic drug: Cholecalciferol Take 5,000 Units by mouth once a  week.        History (reviewed): Past Medical History:  Diagnosis Date   Diabetes (HCC)    Essential tremor    Fibromyalgia    Hypertension    Past Surgical History:  Procedure Laterality Date   BREAST REDUCTION SURGERY     Family History  Problem Relation Age of Onset   Depression Mother    Hypertension Mother    Kidney failure Father    Kidney failure Brother    Other Sister        MVA   Tremor Neg Hx    Social History   Socioeconomic History   Marital status: Legally Separated    Spouse name: Not on file   Number of children: 2   Years of education: HS   Highest education level: 12th grade  Occupational History    Comment: Retired.  Tobacco Use   Smoking status: Former    Types: Cigarettes    Quit date: 02/22/1995    Years since quitting: 27.3   Smokeless tobacco: Never  Vaping Use   Vaping Use: Never used  Substance and Sexual Activity   Alcohol use: Yes    Comment: occasionally   Drug use: No   Sexual activity: Not Currently  Other Topics Concern   Not on file  Social History Narrative   Patient lives at home with her daughter. She enjoys spending time with her family, going to church and watching television.   Social Determinants of Health   Financial Resource Strain: Low Risk  (06/28/2022)   Overall Financial Resource Strain (CARDIA)    Difficulty of Paying Russell Expenses: Not hard at all  Food Insecurity: No Food Insecurity (06/28/2022)   Hunger Vital Sign    Worried About Running Out of Food in the Last Year: Never true    Ran Out of Food in the Last Year: Never true  Transportation Needs: No Transportation Needs (06/28/2022)   PRAPARE - Administrator, Civil Service (Medical): No    Lack of Transportation (Non-Medical): No  Physical Activity: Insufficiently Active (06/28/2022)   Exercise Vital Sign    Days of Exercise per Week: 1 day    Minutes of Exercise per Session: 60 min  Stress: Stress Concern Present (06/28/2022)   [employer] of Occupational Health - Occupational Stress Questionnaire    Feeling of Stress : To some extent  Social Connections: Moderately Isolated (06/28/2022)   Social Connection and Isolation Panel [NHANES]    Frequency of Communication with Friends and Family: More than three times a week    Frequency of Social Gatherings with Friends and Family: More than three times a week    Attends Religious Services: More than 4 times per year    Active Member of [employer] or Organizations: No    Attends Banker Meetings: Never    Marital Status: Separated    Activities of Daily Russell    06/28/2022    9:23 AM  In your present state of health, do you have any difficulty performing the following activities:  Hearing? 0  Vision? 0  Difficulty concentrating or making decisions? 0  Walking or climbing stairs? 1  Comment uses a cane  Dressing or bathing? 0  Doing errands, shopping? 0  Preparing Food and eating ? N  Using the Toilet? N  In the past six months, have you accidently leaked urine? Y  Do you have problems with loss of bowel control? Y  Managing your Medications? N  Managing your Finances? N  Housekeeping or managing your Housekeeping? N    Patient Education/ Literacy How often do you need to have someone help you when you read instructions, pamphlets, or other written materials from your doctor or pharmacy?: 1 - Never What is the last grade level you completed in school?: 12th grade  Exercise Current Exercise Habits: Structured exercise class, Type of exercise: Other - see comments (physical therapy), Time (Minutes): 60, Frequency (Times/Week): 1, Weekly Exercise (Minutes/Week): 60, Intensity: Moderate, Exercise limited by: orthopedic condition(s)  Diet Patient reports consuming 2 meals a day and 0 snack(s) a day Patient reports that her primary diet is: Regular Patient reports that she does have regular access to food.   Depression Screen    06/28/2022    9:24 AM  05/05/2022   10:29 AM 12/01/2021   11:19 AM 05/17/2021  5:00 PM 03/26/2021   10:13 AM 11/25/2019   11:01 AM 05/21/2019   10:31 AM  PHQ 2/9 Scores  PHQ - 2 Score 2 6 6 5 3 3 1   PHQ- 9 Score 5 15 23 26 3 16 13      Fall Risk    06/28/2022    9:19 AM 05/05/2022   10:29 AM 01/20/2022   12:51 PM 03/26/2021   10:11 AM 01/20/2021   10:42 AM  Fall Risk   Falls in the past year? 1 1 0 1 1  Number falls in past yr: 1 1 0 1 1  Injury with Fall? 0 1 0 0 0  Risk for fall due to : History of fall(s);Impaired balance/gait;Impaired mobility History of fall(s)  History of fall(s);Orthopedic patient   Follow up Falls evaluation completed;Education provided;Falls prevention discussed Falls evaluation completed Falls evaluation completed Falls evaluation completed;Education provided;Falls prevention discussed      Objective:  Dominique Russell seemed alert and oriented and she participated appropriately during our telephone visit.  Blood Pressure Weight BMI  BP Readings from Last 3 Encounters:  05/05/22 130/65  03/24/22 128/75  01/20/22 112/62   Wt Readings from Last 3 Encounters:  05/05/22 241 lb 1.3 oz (109.4 kg)  03/24/22 238 lb (108 kg)  01/20/22 239 lb 9.6 oz (108.7 kg)   BMI Readings from Last 1 Encounters:  05/05/22 41.38 kg/m    *Unable to obtain current vital signs, weight, and BMI due to telephone visit type  Hearing/Vision  Dominique Russell did not seem to have difficulty with hearing/understanding during the telephone conversation Reports that she has had a formal eye exam by an eye care professional within the past year Reports that she has not had a formal hearing evaluation within the past year *Unable to fully assess hearing and vision during telephone visit type  Cognitive Function:    06/28/2022    9:26 AM 03/26/2021   10:23 AM  6CIT Screen  What Year? 0 points 0 points  What month? 0 points 0 points  What time? 0 points 0 points  Count back from 20 0 points 0 points  Months in  reverse 0 points 2 points  Repeat phrase 6 points 6 points  Total Score 6 points 8 points   (Normal:0-7, Significant for Dysfunction: >8)  Normal Cognitive Function Screening: Yes   Immunization & Health Maintenance Record Immunization History  Administered Date(s) Administered   Fluad Quad(high Dose 65+) 11/20/2018, 12/01/2021   H1N1 01/24/2008   Influenza Split 11/27/2007, 11/14/2008, 12/02/2014   Influenza,inj,Quad PF,6+ Mos 11/23/2012, 10/26/2016, 12/27/2017, 11/25/2019   Influenza,inj,quad, With Preservative 01/14/2014   Influenza-Unspecified 12/13/2010, 11/01/2011, 11/23/2012, 11/22/2015, 12/17/2015, 01/26/2021   Moderna Covid-19 Vaccine Bivalent Booster 23yrs & up 01/26/2021   PFIZER(Purple Top)SARS-COV-2 Vaccination 06/10/2019, 07/01/2019, 01/23/2020   Pneumococcal Conjugate-13 05/21/2019   Pneumococcal Polysaccharide-23 03/29/2011, 09/14/2016   Tdap 09/14/2016   Zoster Recombinat (Shingrix) 08/01/2018, 10/23/2018   Zoster, Live 01/14/2014    Health Maintenance  Topic Date Due   Medicare Annual Wellness (AWV)  03/26/2022   OPHTHALMOLOGY EXAM  06/28/2022 (Originally 04/06/2022)   Diabetic kidney evaluation - Urine ACR  06/29/2022 (Originally 02/01/2019)   FOOT EXAM  06/29/2022 (Originally 05/20/2020)   COVID-19 Vaccine (5 - 2023-24 season) 07/14/2022 (Originally 10/22/2021)   INFLUENZA VACCINE  09/22/2022   Diabetic kidney evaluation - eGFR measurement  04/02/2023   MAMMOGRAM  04/02/2023   COLONOSCOPY (Pts 45-84yrs Insurance coverage will need to be confirmed)  02/22/2024   Pneumonia Vaccine  22+ Years old (3 of 3 - PPSV23 or PCV20) 05/20/2024   DTaP/Tdap/Td (2 - Td or Tdap) 09/15/2026   DEXA SCAN  06/25/2029   Hepatitis C Screening  Completed   Zoster Vaccines- Shingrix  Completed   HPV VACCINES  Aged Out   HEMOGLOBIN A1C  Discontinued       Assessment  This is a routine wellness examination for Dominique Russell.  Health Maintenance: Due or Overdue Health  Maintenance Due  Topic Date Due   Medicare Annual Wellness (AWV)  03/26/2022    Dominique Russell does not need a referral for Community Assistance: Care Management:   no Social Work:    no Prescription Assistance:  no Nutrition/Diabetes Education:  no   Plan:  Personalized Goals  Goals Addressed               This Visit's Progress     Patient Stated (pt-stated)        Patient stated that she would like to be able to walk better.       Personalized Health Maintenance & Screening Recommendations  Mammogram -Patient declined mammogram referral and stated that her daughter will call and schedule. Urine ACR Foot exam Eye exam  Lung Cancer Screening Recommended: no (Low Dose CT Chest recommended if Age 75-80 years, 30 pack-year currently smoking OR have quit w/in past 15 years) Hepatitis C Screening recommended: no HIV Screening recommended: no  Advanced Directives: Written information was not prepared per patient's request.  Referrals & Orders No orders of the defined types were placed in this encounter.   Follow-up Plan Follow-up with Christen Butter, NP as planned Urine ACR and Foot exam can be done at the next office visit. Schedule eye exam and mammogram.  Medicare wellness visit in one year.  Patient will access AVS on my chart.   I have personally reviewed and noted the following in the patient's chart:   Medical and social history Use of alcohol, tobacco or illicit drugs  Current medications and supplements Functional ability and status Nutritional status Physical activity Advanced directives List of other physicians Hospitalizations, surgeries, and ER visits in previous 12 months Vitals Screenings to include cognitive, depression, and falls Referrals and appointments  In addition, I have reviewed and discussed with Dominique Russell certain preventive protocols, quality metrics, and best practice recommendations. A written personalized care plan for  preventive services as well as general preventive health recommendations is available and can be mailed to the patient at her request.      Modesto Charon, RN BSN  06/28/2022

## 2022-06-28 NOTE — Patient Instructions (Addendum)
MEDICARE ANNUAL WELLNESS VISIT Health Maintenance Summary and Written Plan of Care  Dominique Russell ,  Thank you for allowing me to perform your Medicare Annual Wellness Visit and for your ongoing commitment to your health.   Health Maintenance & Immunization History Health Maintenance  Topic Date Due   OPHTHALMOLOGY EXAM  06/28/2022 (Originally 04/06/2022)   Diabetic kidney evaluation - Urine ACR  06/29/2022 (Originally 02/01/2019)   FOOT EXAM  06/29/2022 (Originally 05/20/2020)   COVID-19 Vaccine (5 - 2023-24 season) 07/14/2022 (Originally 10/22/2021)   INFLUENZA VACCINE  09/22/2022   Diabetic kidney evaluation - eGFR measurement  04/02/2023   MAMMOGRAM  04/02/2023   Medicare Annual Wellness (AWV)  06/28/2023   COLONOSCOPY (Pts 45-62yrs Insurance coverage will need to be confirmed)  02/22/2024   Pneumonia Vaccine 17+ Years old (3 of 3 - PPSV23 or PCV20) 05/20/2024   DTaP/Tdap/Td (2 - Td or Tdap) 09/15/2026   DEXA SCAN  06/25/2029   Hepatitis C Screening  Completed   Zoster Vaccines- Shingrix  Completed   HPV VACCINES  Aged Out   HEMOGLOBIN A1C  Discontinued   Immunization History  Administered Date(s) Administered   Fluad Quad(high Dose 65+) 11/20/2018, 12/01/2021   H1N1 01/24/2008   Influenza Split 11/27/2007, 11/14/2008, 12/02/2014   Influenza,inj,Quad PF,6+ Mos 11/23/2012, 10/26/2016, 12/27/2017, 11/25/2019   Influenza,inj,quad, With Preservative 01/14/2014   Influenza-Unspecified 12/13/2010, 11/01/2011, 11/23/2012, 11/22/2015, 12/17/2015, 01/26/2021   Moderna Covid-19 Vaccine Bivalent Booster 58yrs & up 01/26/2021   PFIZER(Purple Top)SARS-COV-2 Vaccination 06/10/2019, 07/01/2019, 01/23/2020   Pneumococcal Conjugate-13 05/21/2019   Pneumococcal Polysaccharide-23 03/29/2011, 09/14/2016   Tdap 09/14/2016   Zoster Recombinat (Shingrix) 08/01/2018, 10/23/2018   Zoster, Live 01/14/2014    These are the patient goals that we discussed:  Goals Addressed               This  Visit's Progress     Patient Stated (pt-stated)        Patient stated that she would like to be able to walk better.         This is a list of Health Maintenance Items that are overdue or due now: Mammogram -Patient declined mammogram referral and stated that her daughter will call and schedule. Urine ACR Foot exam Eye exam  Orders/Referrals Placed Today: No orders of the defined types were placed in this encounter.  (Contact our referral department at [phone] if you have not spoken with someone about your referral appointment within the next 5 days)    Follow-up Plan Follow-up with Christen Butter, NP as planned Urine ACR and Foot exam can be done at the next office visit. Schedule eye exam and mammogram.  Medicare wellness visit in one year.  Patient will access AVS on my chart.      Health Maintenance, Female Adopting a healthy lifestyle and getting preventive care are important in promoting health and wellness. Ask your health care provider about: The right schedule for you to have regular tests and exams. Things you can do on your own to prevent diseases and keep yourself healthy. What should I know about diet, weight, and exercise? Eat a healthy diet  Eat a diet that includes plenty of vegetables, fruits, low-fat dairy products, and lean protein. Do not eat a lot of foods that are high in solid fats, added sugars, or sodium. Maintain a healthy weight Body mass index (BMI) is used to identify weight problems. It estimates body fat based on height and weight. Your health care provider can help determine your  BMI and help you achieve or maintain a healthy weight. Get regular exercise Get regular exercise. This is one of the most important things you can do for your health. Most adults should: Exercise for at least 150 minutes each week. The exercise should increase your heart rate and make you sweat (moderate-intensity exercise). Do strengthening exercises at least  twice a week. This is in addition to the moderate-intensity exercise. Spend less time sitting. Even light physical activity can be beneficial. Watch cholesterol and blood lipids Have your blood tested for lipids and cholesterol at 69 years of age, then have this test every 5 years. Have your cholesterol levels checked more often if: Your lipid or cholesterol levels are high. You are older than 69 years of age. You are at high risk for heart disease. What should I know about cancer screening? Depending on your health history and family history, you may need to have cancer screening at various ages. This may include screening for: Breast cancer. Cervical cancer. Colorectal cancer. Skin cancer. Lung cancer. What should I know about heart disease, diabetes, and high blood pressure? Blood pressure and heart disease High blood pressure causes heart disease and increases the risk of stroke. This is more likely to develop in people who have high blood pressure readings or are overweight. Have your blood pressure checked: Every 3-5 years if you are 69-65 years of age. Every year if you are 29 years old or older. Diabetes Have regular diabetes screenings. This checks your fasting blood sugar level. Have the screening done: Once every three years after age 68 if you are at a normal weight and have a low risk for diabetes. More often and at a younger age if you are overweight or have a high risk for diabetes. What should I know about preventing infection? Hepatitis B If you have a higher risk for hepatitis B, you should be screened for this virus. Talk with your health care provider to find out if you are at risk for hepatitis B infection. Hepatitis C Testing is recommended for: Everyone born from [birth year] through 1967. Anyone with known risk factors for hepatitis C. Sexually transmitted infections (STIs) Get screened for STIs, including gonorrhea and chlamydia, if: You are sexually active and are  younger than 69 years of age. You are older than 69 years of age and your health care provider tells you that you are at risk for this type of infection. Your sexual activity has changed since you were last screened, and you are at increased risk for chlamydia or gonorrhea. Ask your health care provider if you are at risk. Ask your health care provider about whether you are at high risk for HIV. Your health care provider may recommend a prescription medicine to help prevent HIV infection. If you choose to take medicine to prevent HIV, you should first get tested for HIV. You should then be tested every 3 months for as long as you are taking the medicine. Pregnancy If you are about to stop having your period (premenopausal) and you may become pregnant, seek counseling before you get pregnant. Take 400 to 800 micrograms (mcg) of folic acid every day if you become pregnant. Ask for birth control (contraception) if you want to prevent pregnancy. Osteoporosis and menopause Osteoporosis is a disease in which the bones lose minerals and strength with aging. This can result in bone fractures. If you are 3 years old or older, or if you are at risk for osteoporosis and fractures, ask your  health care provider if you should: Be screened for bone loss. Take a calcium or vitamin D supplement to lower your risk of fractures. Be given hormone replacement therapy (HRT) to treat symptoms of menopause. Follow these instructions at home: Alcohol use Do not drink alcohol if: Your health care provider tells you not to drink. You are pregnant, may be pregnant, or are planning to become pregnant. If you drink alcohol: Limit how much you have to: 0-1 drink a day. Know how much alcohol is in your drink. In the [place] one drink equals one 12 oz bottle of beer (355 mL), one 5 oz glass of wine (148 mL), or one 1 oz glass of hard liquor (44 mL). Lifestyle Do not use any products that contain nicotine or tobacco. These  products include cigarettes, chewing tobacco, and vaping devices, such as e-cigarettes. If you need help quitting, ask your health care provider. Do not use street drugs. Do not share needles. Ask your health care provider for help if you need support or information about quitting drugs. General instructions Schedule regular health, dental, and eye exams. Stay current with your vaccines. Tell your health care provider if: You often feel depressed. You have ever been abused or do not feel safe at home. Summary Adopting a healthy lifestyle and getting preventive care are important in promoting health and wellness. Follow your health care provider's instructions about healthy diet, exercising, and getting tested or screened for diseases. Follow your health care provider's instructions on monitoring your cholesterol and blood pressure. This information is not intended to replace advice given to you by your health care provider. Make sure you discuss any questions you have with your health care provider. Document Revised: 06/29/2020 Document Reviewed: 06/29/2020 Elsevier Patient Education  2023 [employer].

## 2022-06-30 ENCOUNTER — Encounter: Payer: 59 | Admitting: Medical-Surgical

## 2022-06-30 ENCOUNTER — Ambulatory Visit: Payer: 59 | Admitting: Family Medicine

## 2022-06-30 NOTE — Progress Notes (Signed)
Erroneous encounter

## 2022-07-05 ENCOUNTER — Encounter: Payer: Self-pay | Admitting: Medical-Surgical

## 2022-07-05 ENCOUNTER — Ambulatory Visit (INDEPENDENT_AMBULATORY_CARE_PROVIDER_SITE_OTHER): Payer: 59 | Admitting: Medical-Surgical

## 2022-07-05 VITALS — BP 101/69 | HR 90 | Temp 97.6°F | Ht 64.0 in | Wt 239.1 lb

## 2022-07-05 DIAGNOSIS — M5441 Lumbago with sciatica, right side: Secondary | ICD-10-CM

## 2022-07-05 DIAGNOSIS — Z794 Long term (current) use of insulin: Secondary | ICD-10-CM

## 2022-07-05 DIAGNOSIS — M2550 Pain in unspecified joint: Secondary | ICD-10-CM

## 2022-07-05 DIAGNOSIS — E1142 Type 2 diabetes mellitus with diabetic polyneuropathy: Secondary | ICD-10-CM | POA: Diagnosis not present

## 2022-07-05 DIAGNOSIS — M069 Rheumatoid arthritis, unspecified: Secondary | ICD-10-CM

## 2022-07-05 DIAGNOSIS — R21 Rash and other nonspecific skin eruption: Secondary | ICD-10-CM

## 2022-07-05 DIAGNOSIS — I10 Essential (primary) hypertension: Secondary | ICD-10-CM | POA: Diagnosis not present

## 2022-07-05 DIAGNOSIS — G4733 Obstructive sleep apnea (adult) (pediatric): Secondary | ICD-10-CM | POA: Diagnosis not present

## 2022-07-05 LAB — POCT GLYCOSYLATED HEMOGLOBIN (HGB A1C): Hemoglobin A1C: 8.4 % — AB (ref 4.0–5.6)

## 2022-07-05 LAB — POCT UA - MICROALBUMIN
Albumin/Creatinine Ratio, Urine, POC: <30
Creatinine, POC: 100 mg/dL
Microalbumin Ur, POC: 30 mg/L

## 2022-07-05 MED ORDER — VALSARTAN 320 MG PO TABS
320.0000 mg | ORAL_TABLET | Freq: Every day | ORAL | 1 refills | Status: AC
Start: 2022-07-05 — End: ?

## 2022-07-05 MED ORDER — AMBULATORY NON FORMULARY MEDICATION: 0 refills | Status: AC

## 2022-07-05 MED ORDER — QUAD CANE/SMALL BASE MISC: 0 refills | Status: AC

## 2022-07-05 NOTE — Progress Notes (Signed)
Established patient visit  History, exam, impression, and plan:  1. Type 2 diabetes mellitus with diabetic polyneuropathy, with long-term current use of insulin [facility]) Pleasant 67 year old female accompanied by her daughter presenting today for follow-up on type 2 diabetes.  She has endocrinologist but does not have an appointment with them until August.  Earlier this year, she was overdue for hemoglobin A1c check and we found it to be significantly elevated.  Today in for recheck of her hemoglobin A1c which shows that is down to 8.4%, still uncontrolled.  Point-of-care microalbumin completed with normal results.  Currently taking 70 units of NovoLog 70/30 twice daily.  Monitoring sugars at home at times and reports that they have still been high lately.  This morning, her fasting glucose was 150.  Admits that she does love her sweets and has a tendency to eat them despite knowing they are not good for her diabetes.  Discussed the recommendations for adherence to a diabetic diet and limitation of sweets, simple carbohydrates, etc.  Recommend continuing to check sugars with a fasting goal of 120 or less.  Follow-up as scheduled with endocrinology who will further manage her diabetes.  No change in insulin at this time. - POCT HgB A1C - POCT UA - Microalbumin  2. Essential hypertension Currently taking valsartan 320 mg daily and hydrochlorothiazide 25 mg daily.  Tolerating both medications well without side effects.  Not regularly checking blood pressure at home.  Following low-sodium diet.  Limited activity due to orthopedic conditions and chronic illnesses.  Denies CP, SOB, HA, palpitations, dizziness, and vision changes.  Does have lower extremity edema at times.  On exam, HRRR, S1/S2 normal.  Lungs CTA, respirations even and unlabored.  Mild nonpitting peripheral edema noted to the bilateral lower extremities.  Blood pressure at goal today.  Continue valsartan and hydrochlorothiazide as  prescribed. - valsartan (DIOVAN) 320 MG tablet; Take 1 tablet (320 mg total) by mouth daily.  Dispense: 90 tablet; Refill: 1  3. Acute right-sided low back pain with right-sided sciatica Continues to have difficulty with right-sided low back pain.  She has multiple orthopedic conditions that limit her ability to do regular intentional exercise.  Has requested for a new quad cane and a rolling walker however has been unable to get this.  We have printed and faxed the prescription to medical supply locations in the area but they have not heard from anyone regarding this.  Requesting printed prescriptions to be provided today.  This was completed and the prescriptions were provided to her daughter.  Handicap placard completed per patient request.  4. Rash Has had a generalized rash on upper and lower extremities that she reports is very pruritic.  Skin is extremely dry although she has no history of eczema.  Is using over-the-counter creams/lotions with minimal benefit.  Would like to see dermatology for further evaluation.  On exam, her skin is very dry with patchy discoloration on all extremities.  Referring to dermatology per patient request. - Ambulatory referral to Dermatology  5. Polyarthralgia 6. Rheumatoid arthritis, involving unspecified site, unspecified whether rheumatoid factor present (HCC) History of polyarthralgia and rheumatoid arthritis.  Not currently being followed by rheumatology but would like to get reestablished with them.  Referral placed today. - Ambulatory referral to Rheumatology   Procedures performed this visit: None.  Return in about 6 months (around 01/05/2023) for chronic disease follow up.  __________________________________ Thayer Ohm, DNP, APRN, FNP-BC Primary Care and Sports Medicine  [facility]

## 2022-07-08 ENCOUNTER — Other Ambulatory Visit: Payer: Self-pay | Admitting: Medical-Surgical

## 2022-07-19 DIAGNOSIS — G63 Polyneuropathy in diseases classified elsewhere: Secondary | ICD-10-CM | POA: Diagnosis not present

## 2022-07-19 DIAGNOSIS — R2689 Other abnormalities of gait and mobility: Secondary | ICD-10-CM | POA: Diagnosis not present

## 2022-07-19 DIAGNOSIS — Z6841 Body Mass Index (BMI) 40.0 and over, adult: Secondary | ICD-10-CM | POA: Diagnosis not present

## 2022-07-19 DIAGNOSIS — R296 Repeated falls: Secondary | ICD-10-CM | POA: Diagnosis not present

## 2022-07-19 DIAGNOSIS — Z7409 Other reduced mobility: Secondary | ICD-10-CM | POA: Diagnosis not present

## 2022-07-26 DIAGNOSIS — Z7409 Other reduced mobility: Secondary | ICD-10-CM | POA: Diagnosis not present

## 2022-07-26 DIAGNOSIS — G63 Polyneuropathy in diseases classified elsewhere: Secondary | ICD-10-CM | POA: Diagnosis not present

## 2022-07-26 DIAGNOSIS — R2689 Other abnormalities of gait and mobility: Secondary | ICD-10-CM | POA: Diagnosis not present

## 2022-07-26 DIAGNOSIS — R296 Repeated falls: Secondary | ICD-10-CM | POA: Diagnosis not present

## 2022-08-08 ENCOUNTER — Other Ambulatory Visit: Payer: Self-pay | Admitting: Medical-Surgical

## 2022-08-09 DIAGNOSIS — G63 Polyneuropathy in diseases classified elsewhere: Secondary | ICD-10-CM | POA: Diagnosis not present

## 2022-08-09 DIAGNOSIS — R2689 Other abnormalities of gait and mobility: Secondary | ICD-10-CM | POA: Diagnosis not present

## 2022-08-09 DIAGNOSIS — R296 Repeated falls: Secondary | ICD-10-CM | POA: Diagnosis not present

## 2022-08-09 DIAGNOSIS — Z7409 Other reduced mobility: Secondary | ICD-10-CM | POA: Diagnosis not present

## 2022-08-16 DIAGNOSIS — R296 Repeated falls: Secondary | ICD-10-CM | POA: Diagnosis not present

## 2022-08-16 DIAGNOSIS — R2689 Other abnormalities of gait and mobility: Secondary | ICD-10-CM | POA: Diagnosis not present

## 2022-08-16 DIAGNOSIS — G63 Polyneuropathy in diseases classified elsewhere: Secondary | ICD-10-CM | POA: Diagnosis not present

## 2022-08-16 DIAGNOSIS — Z7409 Other reduced mobility: Secondary | ICD-10-CM | POA: Diagnosis not present

## 2022-08-23 DIAGNOSIS — R296 Repeated falls: Secondary | ICD-10-CM | POA: Diagnosis not present

## 2022-08-23 DIAGNOSIS — G63 Polyneuropathy in diseases classified elsewhere: Secondary | ICD-10-CM | POA: Diagnosis not present

## 2022-08-23 DIAGNOSIS — R2689 Other abnormalities of gait and mobility: Secondary | ICD-10-CM | POA: Diagnosis not present

## 2022-08-23 DIAGNOSIS — Z7409 Other reduced mobility: Secondary | ICD-10-CM | POA: Diagnosis not present

## 2022-09-06 DIAGNOSIS — M25561 Pain in right knee: Secondary | ICD-10-CM | POA: Diagnosis not present

## 2022-09-06 DIAGNOSIS — G609 Hereditary and idiopathic neuropathy, unspecified: Secondary | ICD-10-CM | POA: Diagnosis not present

## 2022-09-06 DIAGNOSIS — M545 Low back pain, unspecified: Secondary | ICD-10-CM | POA: Diagnosis not present

## 2022-09-06 DIAGNOSIS — G8929 Other chronic pain: Secondary | ICD-10-CM | POA: Diagnosis not present

## 2022-09-06 DIAGNOSIS — M19011 Primary osteoarthritis, right shoulder: Secondary | ICD-10-CM | POA: Diagnosis not present

## 2022-09-06 DIAGNOSIS — M19012 Primary osteoarthritis, left shoulder: Secondary | ICD-10-CM | POA: Diagnosis not present

## 2022-09-06 DIAGNOSIS — M170 Bilateral primary osteoarthritis of knee: Secondary | ICD-10-CM | POA: Diagnosis not present

## 2022-09-06 DIAGNOSIS — M25562 Pain in left knee: Secondary | ICD-10-CM | POA: Diagnosis not present

## 2022-09-06 DIAGNOSIS — M542 Cervicalgia: Secondary | ICD-10-CM | POA: Diagnosis not present

## 2022-09-06 DIAGNOSIS — M797 Fibromyalgia: Secondary | ICD-10-CM | POA: Diagnosis not present

## 2022-10-04 DIAGNOSIS — F3181 Bipolar II disorder: Secondary | ICD-10-CM | POA: Diagnosis not present

## 2022-10-18 DIAGNOSIS — Z794 Long term (current) use of insulin: Secondary | ICD-10-CM | POA: Diagnosis not present

## 2022-11-13 ENCOUNTER — Other Ambulatory Visit: Payer: Self-pay | Admitting: Medical-Surgical

## 2022-11-15 ENCOUNTER — Other Ambulatory Visit: Payer: Self-pay | Admitting: Medical-Surgical

## 2022-11-17 NOTE — Telephone Encounter (Signed)
Requesting rx rf of Allopurinol  Last written 0517/2024 Last OV  07/05/2022 Upcoming appt 11/22/2022

## 2022-11-22 ENCOUNTER — Encounter: Payer: Self-pay | Admitting: Medical-Surgical

## 2022-11-22 ENCOUNTER — Ambulatory Visit (INDEPENDENT_AMBULATORY_CARE_PROVIDER_SITE_OTHER): Payer: BC Managed Care – PPO | Admitting: Medical-Surgical

## 2022-11-22 VITALS — BP 144/77 | HR 89 | Resp 20 | Ht 64.0 in | Wt 244.0 lb

## 2022-11-22 DIAGNOSIS — E66813 Obesity, class 3: Secondary | ICD-10-CM

## 2022-11-22 DIAGNOSIS — Z23 Encounter for immunization: Secondary | ICD-10-CM | POA: Diagnosis not present

## 2022-11-22 DIAGNOSIS — N3281 Overactive bladder: Secondary | ICD-10-CM | POA: Diagnosis not present

## 2022-11-22 DIAGNOSIS — E1169 Type 2 diabetes mellitus with other specified complication: Secondary | ICD-10-CM | POA: Diagnosis not present

## 2022-11-22 DIAGNOSIS — M1A9XX0 Chronic gout, unspecified, without tophus (tophi): Secondary | ICD-10-CM | POA: Diagnosis not present

## 2022-11-22 DIAGNOSIS — I10 Essential (primary) hypertension: Secondary | ICD-10-CM

## 2022-11-22 DIAGNOSIS — K219 Gastro-esophageal reflux disease without esophagitis: Secondary | ICD-10-CM | POA: Diagnosis not present

## 2022-11-22 DIAGNOSIS — Z6841 Body Mass Index (BMI) 40.0 and over, adult: Secondary | ICD-10-CM

## 2022-11-22 DIAGNOSIS — G63 Polyneuropathy in diseases classified elsewhere: Secondary | ICD-10-CM

## 2022-11-22 DIAGNOSIS — E785 Hyperlipidemia, unspecified: Secondary | ICD-10-CM

## 2022-11-22 MED ORDER — FLUTICASONE PROPIONATE 50 MCG/ACT NA SUSP: 2.0000 | Freq: Every day | NASAL | 6 refills | Status: DC

## 2022-11-22 MED ORDER — ALLOPURINOL 300 MG PO TABS: 300.0000 mg | ORAL_TABLET | Freq: Every day | ORAL | 0 refills | Status: DC

## 2022-11-22 MED ORDER — VALSARTAN 320 MG PO TABS: 320.0000 mg | ORAL_TABLET | Freq: Every day | ORAL | 1 refills | Status: AC

## 2022-11-22 MED ORDER — COLCHICINE 0.6 MG PO TABS: ORAL_TABLET | ORAL | 0 refills | Status: DC

## 2022-11-22 MED ORDER — OMEPRAZOLE 40 MG PO CPDR: 40.0000 mg | DELAYED_RELEASE_CAPSULE | Freq: Every day | ORAL | 0 refills | Status: DC

## 2022-11-22 MED ORDER — HYDROCHLOROTHIAZIDE 25 MG PO TABS: 25.0000 mg | ORAL_TABLET | Freq: Every day | ORAL | 1 refills | Status: DC

## 2022-11-22 MED ORDER — FAMOTIDINE 20 MG PO TABS: 20.0000 mg | ORAL_TABLET | Freq: Two times a day (BID) | ORAL | 1 refills | Status: DC

## 2022-11-22 MED ORDER — MIRABEGRON ER 50 MG PO TB24
50.0000 mg | ORAL_TABLET | Freq: Every day | ORAL | 1 refills | Status: DC
Start: 2022-11-22 — End: 2023-09-13

## 2022-11-22 MED ORDER — ROSUVASTATIN CALCIUM 20 MG PO TABS
20.0000 mg | ORAL_TABLET | Freq: Every day | ORAL | 0 refills | Status: DC
Start: 2022-11-22 — End: 2023-05-10

## 2022-11-22 NOTE — Progress Notes (Signed)
Established patient visit  History, exam, impression, and plan:  1. Essential hypertension Very pleasant 68 year old female presenting today with a history of hypertension currently treated with valsartan 320 mg daily and hydrochlorothiazide 25 mg daily.  Blood pressure elevated on arrival, recheck 144/77.  Not currently checking blood pressures at home however there is a cuff available to use.  Has cut back on the sodium intake.  Endorses bilateral lower extremity edema which is chronic.  She does have compression socks however they are in the drawer rather than on her legs.  Advised her to place these on first thing in the morning before the swelling worsens and remove them in the evening when she is able to put her legs up.  Checking labs as below. Recommend monitoring BP at home with a goal of 130/80 or less. Continue current medications. - CMP14+EGFR - Lipid panel - hydrochlorothiazide (HYDRODIURIL) 25 MG tablet; Take 1 tablet (25 mg total) by mouth daily.  Dispense: 90 tablet; Refill: 1 - valsartan (DIOVAN) 320 MG tablet; Take 1 tablet (320 mg total) by mouth daily.  Dispense: 90 tablet; Refill: 1  2. Class 3 severe obesity due to excess calories with serious comorbidity and body mass index (BMI) of 40.0 to 44.9 in adult [facility]) Continues to have difficulty with morbid obesity however her activity is significantly limited by orthopedic conditions.  Recommend working on dietary modification with a goal for weight loss.  3. Polyneuropathy associated with underlying disease [facility]) Seeing endocrinology who is helping manage her neuropathy symptoms.  4. Chronic gout without tophus, unspecified cause, unspecified site Taking allopurinol 300 mg daily, tolerating well without side effects.  Has colchicine to use on an as-needed basis for gout flares.  Checking uric acid today.  No recent flares noted.  Continue allopurinol. - Uric acid - allopurinol (ZYLOPRIM) 300 MG tablet; Take 1 tablet  (300 mg total) by mouth daily.  Dispense: 90 tablet; Refill: 0 - colchicine 0.6 MG tablet; TAKE 1 TABLET BY MOUTH EVERY DAY/ needs appt  Dispense: 15 tablet; Refill: 0  5. Need for influenza vaccination Flu vaccine given in office today. - Flu Vaccine Trivalent High Dose (Fluad)  6. OAB (overactive bladder) She does have a history of overactive bladder and is currently taking Myrbetriq 50 mg daily.  Tolerating well without side effects.  Feels the medication helps with the overactive bladder symptoms.  Continue Myrbetriq as prescribed. - mirabegron ER (MYRBETRIQ) 50 MG TB24 tablet; Take 1 tablet (50 mg total) by mouth daily.  Dispense: 90 tablet; Refill: 1  7. Gastroesophageal reflux disease, unspecified whether esophagitis present History of significant GERD with difficulty controlling it on 1 medication.  She is taking omeprazole 40 mg daily and famotidine 20 mg twice daily as prescribed.  Feels that these 2 medications together help control her symptoms well and has no current complaints.  Continue omeprazole and famotidine. - famotidine (PEPCID) 20 MG tablet; Take 1 tablet (20 mg total) by mouth 2 (two) times daily.  Dispense: 180 tablet; Refill: 1 - omeprazole (PRILOSEC) 40 MG capsule; Take 1 capsule (40 mg total) by mouth daily.  Dispense: 90 capsule; Refill: 0  8. Hyperlipidemia associated with type 2 diabetes mellitus (HCC) History of hyperlipidemia currently treated with Crestor 20 mg daily.  Checking labs today.  Continue Crestor. - CMP14+EGFR - Lipid panel - rosuvastatin (CRESTOR) 20 MG tablet; Take 1 tablet (20 mg total) by mouth daily.  Dispense: 90 tablet; Refill: 0  Procedures performed this visit: None.  Return in about 6 months (around 05/23/2023) for chronic disease follow up.  __________________________________ Thayer Ohm, DNP, APRN, FNP-BC Primary Care and Sports Medicine [facility]

## 2022-11-23 LAB — LIPID PANEL
Chol/HDL Ratio: 2.7 {ratio} (ref 0.0–4.4)
Cholesterol, Total: 170 mg/dL (ref 100–199)
HDL: 63 mg/dL (ref >39)
LDL Chol Calc (NIH): 79 mg/dL (ref 0–99)
Triglycerides: 164 mg/dL — ABNORMAL HIGH (ref 0–149)
VLDL Cholesterol Cal: 28 mg/dL (ref 5–40)

## 2022-11-23 LAB — CMP14+EGFR
ALT: 18 [IU]/L (ref 0–32)
AST: 23 [IU]/L (ref 0–40)
Albumin: 4 g/dL (ref 3.9–4.9)
Alkaline Phosphatase: 82 [IU]/L (ref 44–121)
BUN/Creatinine Ratio: 26 (ref 12–28)
BUN: 22 mg/dL (ref 8–27)
Bilirubin Total: <0.2 mg/dL (ref 0.0–1.2)
CO2: 23 mmol/L (ref 20–29)
Calcium: 9.3 mg/dL (ref 8.7–10.3)
Chloride: 104 mmol/L (ref 96–106)
Creatinine, Ser: 0.84 mg/dL (ref 0.57–1.00)
Globulin, Total: 2.4 g/dL (ref 1.5–4.5)
Glucose: 181 mg/dL — ABNORMAL HIGH (ref 70–99)
Potassium: 4.8 mmol/L (ref 3.5–5.2)
Sodium: 140 mmol/L (ref 134–144)
Total Protein: 6.4 g/dL (ref 6.0–8.5)
eGFR: 75 mL/min/{1.73_m2} (ref >59)

## 2022-11-23 LAB — URIC ACID: Uric Acid: 6.5 mg/dL (ref 3.0–7.2)

## 2022-11-30 DIAGNOSIS — G4733 Obstructive sleep apnea (adult) (pediatric): Secondary | ICD-10-CM | POA: Diagnosis not present

## 2022-12-01 ENCOUNTER — Other Ambulatory Visit: Payer: Self-pay | Admitting: Medical-Surgical

## 2022-12-01 DIAGNOSIS — M1A9XX0 Chronic gout, unspecified, without tophus (tophi): Secondary | ICD-10-CM

## 2022-12-06 ENCOUNTER — Encounter: Payer: Self-pay | Admitting: Medical-Surgical

## 2022-12-06 DIAGNOSIS — M25562 Pain in left knee: Secondary | ICD-10-CM | POA: Diagnosis not present

## 2022-12-06 DIAGNOSIS — G609 Hereditary and idiopathic neuropathy, unspecified: Secondary | ICD-10-CM | POA: Diagnosis not present

## 2022-12-06 DIAGNOSIS — M170 Bilateral primary osteoarthritis of knee: Secondary | ICD-10-CM | POA: Diagnosis not present

## 2022-12-06 DIAGNOSIS — M549 Dorsalgia, unspecified: Secondary | ICD-10-CM | POA: Diagnosis not present

## 2022-12-06 DIAGNOSIS — M19012 Primary osteoarthritis, left shoulder: Secondary | ICD-10-CM | POA: Diagnosis not present

## 2022-12-06 DIAGNOSIS — M25561 Pain in right knee: Secondary | ICD-10-CM | POA: Diagnosis not present

## 2022-12-06 DIAGNOSIS — G894 Chronic pain syndrome: Secondary | ICD-10-CM | POA: Diagnosis not present

## 2022-12-06 DIAGNOSIS — M19011 Primary osteoarthritis, right shoulder: Secondary | ICD-10-CM | POA: Diagnosis not present

## 2022-12-08 ENCOUNTER — Telehealth: Payer: Self-pay

## 2022-12-08 NOTE — Telephone Encounter (Signed)
-----   Message from Christen Butter sent at 12/06/2022  6:40 AM EDT ----- Please contact Verdon Cummins to see how her home blood pressure readings have been looking.  Specific readings or a range of numbers would be great. Thanks, Joy ----- Message ----- From: Christen Butter, NP Sent: 12/06/2022  12:00 AM EDT To: Christen Butter, NP  Check on home BP readings

## 2022-12-08 NOTE — Telephone Encounter (Signed)
I have called multiple times to get her home blood pressure readings. Left message for a return call.

## 2022-12-09 NOTE — Telephone Encounter (Signed)
Attempted call to patient . Phone rang without answer. Could not leave a voice mail message.

## 2022-12-31 DIAGNOSIS — G4733 Obstructive sleep apnea (adult) (pediatric): Secondary | ICD-10-CM | POA: Diagnosis not present

## 2023-01-05 ENCOUNTER — Ambulatory Visit: Payer: 59 | Admitting: Medical-Surgical

## 2023-01-17 ENCOUNTER — Other Ambulatory Visit: Payer: Self-pay | Admitting: Medical-Surgical

## 2023-01-17 DIAGNOSIS — K219 Gastro-esophageal reflux disease without esophagitis: Secondary | ICD-10-CM

## 2023-01-23 NOTE — Progress Notes (Unsigned)
Assessment/Plan:    1.  Essential Tremor  -She is status post focused ultrasound on the right at [facility] in [place] August 17, 2020.  Postoperatively, she had contralateral weakness and slurred speech that since resolved.  -Patient went off the primidone postprocedure, but symptoms got worse and she ended up going back on the medication, at a higher dose than she was on preprocedure.  -she wants to stay on primidone, 50 mg, 3 po bid.  Refills today  2.  OSAS  -Patient following with Dr. Maple Hudson for BiPAP  3.  Dementia  -This was demonstrated on neurocognitive testing with Dr. Alinda Dooms in March, 2019, along with severe anxiety and depression, for which she [facility].  Memory changes is very mild today and I am not convinced of the dx.  I have not seen any significant changes in her, and I certainly should have seen that since 2019.  -She is on several medications which could contribute to memory change.  She is following with pain management and on Lyrica, 150 mg twice per day.  4.  Falls  -were out of the bed.  She and I discussed bed rails and she is going to get some.  Discussed that she can get them from [place].    Subjective:   Dominique Russell was seen today in follow up for essential tremor.  My previous records were reviewed prior to todays visit.  This patient is accompanied in the office by her child who supplements the history.  Her last visit was 1 year ago.  She is on primidone for tremor.  She has had focused ultrasound, but was not able to get off of the primidone.   She reports that "I got a little tremor on the right hand but the left doesn't bother me a bit."   She reports 2 falls rolling out of the bed.  With one, she was awake and rolled over and then rolled to the edge and fell back to sleep accidentally and "when I went to get back up, I rolled right off."  The 2nd fall was similar.      Current prescribed movement disorder  medications: Primidone, 50 mg twice daily, 3 po bid   PREVIOUS MEDICATIONS: Current/Previously tried tremor medications: primidone (states higher dosages caused memory change), topamax (personality change), gabapentin (was on it for neuropathy); lyrica; artane (pt stated didn't help - no SE and I never saw pt on it as d/c before came back); propranolol (pt felt made her worse); lamictal (placed on by psych but states it caused tremor)  ALLERGIES:   Allergies  Allergen Reactions   Amlodipine Other (See Comments)    Causes her to have shakes.    CURRENT MEDICATIONS:  Outpatient Encounter Medications as of 01/24/2023  Medication Sig   acetaminophen (TYLENOL) 650 MG CR tablet Take 650 mg by mouth every 8 (eight) hours as needed for pain.   allopurinol (ZYLOPRIM) 300 MG tablet Take 1 tablet (300 mg total) by mouth daily.   ALPRAZolam (XANAX) 1 MG tablet Take by mouth.   AMBULATORY NON FORMULARY MEDICATION Please dispense an adjustable height Rollator walker with brakes   B-D UF III MINI PEN NEEDLES 31G X 5 MM MISC USE AS DIRECTED WITH INSULIN AND VICTOZA.   Cholecalciferol (VITAMIN D-3) 25 MCG (1000 UT) CAPS Take 2 capsules (2,000 Units total) by mouth daily.   clobetasol ointment (TEMOVATE) 0.05 % Apply 1 application topically 2 (two)  times daily. To affected area(s) as needed, max 2-3 weeks to avoid whitening/thinning skin   colchicine 0.6 MG tablet TAKE 1 TABLET BY MOUTH EVERY DAY   cyclobenzaprine (FLEXERIL) 10 MG tablet TAKE 0.5-1 TABLETS (5-10 MG TOTAL) BY MOUTH 3 (THREE) TIMES DAILY AS NEEDED FOR MUSCLE SPASMS. CAUTION: CAN CAUSE DROWSINESS   diclofenac sodium (VOLTAREN) 1 % GEL Place onto the skin.   DULoxetine (CYMBALTA) 60 MG capsule Take 1 capsule (60 mg total) by mouth daily.   famotidine (PEPCID) 20 MG tablet Take 1 tablet (20 mg total) by mouth 2 (two) times daily.   fluticasone (FLONASE) 50 MCG/ACT nasal spray Place 2 sprays into both nostrils daily.   folic acid (FOLVITE) 800  MCG tablet Take 400 mcg by mouth daily.   hydrochlorothiazide (HYDRODIURIL) 25 MG tablet Take 1 tablet (25 mg total) by mouth daily.   insulin aspart protamine- aspart (NOVOLOG MIX 70/30) (70-30) 100 UNIT/ML injection Inject into the skin.   ipratropium (ATROVENT) 0.06 % nasal spray Place 2 sprays into both nostrils 4 (four) times daily.   LYRICA 150 MG capsule Take 1 tablet by mouth 2 (two) times daily.   mirabegron ER (MYRBETRIQ) 50 MG TB24 tablet Take 1 tablet (50 mg total) by mouth daily.   mirtazapine (REMERON) 15 MG tablet Take 15 mg by mouth at bedtime.   Misc. Devices (QUAD CANE/SMALL BASE) MISC Use daily   Omega-3 Fatty Acids (FISH OIL) 1000 MG CAPS Take 1 capsule by mouth daily.    omeprazole (PRILOSEC) 40 MG capsule Take 1 capsule (40 mg total) by mouth daily.   ONE TOUCH ULTRA TEST test strip    RADIANCE PLATINUM VITAMIN D3 125 MCG (5000 UT) TABS Take 5,000 Units by mouth once a week.   rosuvastatin (CRESTOR) 20 MG tablet Take 1 tablet (20 mg total) by mouth daily.   scopolamine (TRANSDERM-SCOP) 1 MG/3DAYS Place 1 patch (1.5 mg total) onto the skin every 3 (three) days.   traZODone (DESYREL) 150 MG tablet Take 1 tablet (150 mg total) by mouth at bedtime. NEEDS APPOINTMENT FOR FURTHER REFILLS.   valsartan (DIOVAN) 320 MG tablet Take 1 tablet (320 mg total) by mouth daily.   vitamin B-12 (CYANOCOBALAMIN) 1000 MCG tablet Take 1,000 mcg by mouth daily.    [DISCONTINUED] primidone (MYSOLINE) 50 MG tablet Take 3 tablets (150 mg total) by mouth 2 (two) times daily as needed.   primidone (MYSOLINE) 50 MG tablet Take 3 tablets (150 mg total) by mouth 2 (two) times daily as needed.   No facility-administered encounter medications on file as of 01/24/2023.     Objective:    PHYSICAL EXAMINATION:    VITALS:   Vitals:   01/24/23 1310  BP: 128/82  Pulse: 72  SpO2: 96%  Weight: 243 lb 12.8 oz (110.6 kg)   GEN:  The patient appears stated age and is in NAD. HEENT:  Normocephalic,  atraumatic.  The mucous membranes are moist.   Neurological examination:  Orientation: The patient is alert and oriented x3. Cranial nerves: There is good facial symmetry. The speech is fluent and clear. Soft palate rises symmetrically and there is no tongue deviation. Hearing is intact to conversational tone. Sensation: Sensation is intact to light touch throughout Motor: Strength is at least antigravity x4.  Movement examination: Tone: There is normal tone in the UE/LE Abnormal movements: no rest tremor.  No postural tremor today.  No intention tremor today.  Archimedes spirals are without tremor.  Coordination:  There is no  decremation with RAM's  I have reviewed and interpreted the following labs independently   Chemistry      Component Value Date/Time   NA 140 11/22/2022 1537   K 4.8 11/22/2022 1537   CL 104 11/22/2022 1537   CO2 23 11/22/2022 1537   BUN 22 11/22/2022 1537   CREATININE 0.84 11/22/2022 1537   CREATININE 0.99 04/01/2022 1053   GLU 84 07/20/2018 0000      Component Value Date/Time   CALCIUM 9.3 11/22/2022 1537   ALKPHOS 82 11/22/2022 1537   AST 23 11/22/2022 1537   ALT 18 11/22/2022 1537   BILITOT <0.2 11/22/2022 1537      Lab Results  Component Value Date   WBC 5.6 03/24/2022   HGB 12.3 03/24/2022   HCT 37.2 03/24/2022   MCV 89.6 03/24/2022   PLT 194 03/24/2022   Lab Results  Component Value Date   TSH 1.08 12/01/2021     Chemistry      Component Value Date/Time   NA 140 11/22/2022 1537   K 4.8 11/22/2022 1537   CL 104 11/22/2022 1537   CO2 23 11/22/2022 1537   BUN 22 11/22/2022 1537   CREATININE 0.84 11/22/2022 1537   CREATININE 0.99 04/01/2022 1053   GLU 84 07/20/2018 0000      Component Value Date/Time   CALCIUM 9.3 11/22/2022 1537   ALKPHOS 82 11/22/2022 1537   AST 23 11/22/2022 1537   ALT 18 11/22/2022 1537   BILITOT <0.2 11/22/2022 1537       Cc:  Christen Butter, NP

## 2023-01-24 ENCOUNTER — Encounter: Payer: Self-pay | Admitting: Neurology

## 2023-01-24 ENCOUNTER — Ambulatory Visit (INDEPENDENT_AMBULATORY_CARE_PROVIDER_SITE_OTHER): Payer: BC Managed Care – PPO | Admitting: Neurology

## 2023-01-24 DIAGNOSIS — G250 Essential tremor: Secondary | ICD-10-CM

## 2023-01-24 DIAGNOSIS — M549 Dorsalgia, unspecified: Secondary | ICD-10-CM | POA: Diagnosis not present

## 2023-01-24 DIAGNOSIS — M19011 Primary osteoarthritis, right shoulder: Secondary | ICD-10-CM | POA: Diagnosis not present

## 2023-01-24 DIAGNOSIS — G894 Chronic pain syndrome: Secondary | ICD-10-CM | POA: Diagnosis not present

## 2023-01-24 DIAGNOSIS — M19012 Primary osteoarthritis, left shoulder: Secondary | ICD-10-CM | POA: Diagnosis not present

## 2023-01-24 DIAGNOSIS — M170 Bilateral primary osteoarthritis of knee: Secondary | ICD-10-CM | POA: Diagnosis not present

## 2023-01-24 DIAGNOSIS — M25561 Pain in right knee: Secondary | ICD-10-CM | POA: Diagnosis not present

## 2023-01-24 DIAGNOSIS — M25562 Pain in left knee: Secondary | ICD-10-CM | POA: Diagnosis not present

## 2023-01-24 MED ORDER — PRIMIDONE 50 MG PO TABS: 150.0000 mg | ORAL_TABLET | Freq: Two times a day (BID) | ORAL | 3 refills | Status: AC | PRN

## 2023-01-26 ENCOUNTER — Other Ambulatory Visit: Payer: Self-pay | Admitting: Medical-Surgical

## 2023-01-26 DIAGNOSIS — K219 Gastro-esophageal reflux disease without esophagitis: Secondary | ICD-10-CM

## 2023-01-26 DIAGNOSIS — E1169 Type 2 diabetes mellitus with other specified complication: Secondary | ICD-10-CM

## 2023-01-30 DIAGNOSIS — G4733 Obstructive sleep apnea (adult) (pediatric): Secondary | ICD-10-CM | POA: Diagnosis not present

## 2023-01-31 DIAGNOSIS — Z1231 Encounter for screening mammogram for malignant neoplasm of breast: Secondary | ICD-10-CM | POA: Diagnosis not present

## 2023-01-31 DIAGNOSIS — R92323 Mammographic fibroglandular density, bilateral breasts: Secondary | ICD-10-CM | POA: Diagnosis not present

## 2023-01-31 LAB — HM MAMMOGRAPHY

## 2023-02-23 DIAGNOSIS — F3181 Bipolar II disorder: Secondary | ICD-10-CM | POA: Diagnosis not present

## 2023-03-21 DIAGNOSIS — F3181 Bipolar II disorder: Secondary | ICD-10-CM | POA: Diagnosis not present

## 2023-03-22 ENCOUNTER — Encounter: Payer: Self-pay | Admitting: Medical-Surgical

## 2023-04-20 NOTE — Progress Notes (Signed)
   04/20/2023  Patient ID: Dominique Russell, female   DOB: [birth year], 68 y.o.   MRN: [id number]  In basket message stating patient had called PCP office to schedule a visit with me.  Attempted to call patient on home and mobile number but did not get a response.  HIPAA compliant voicemail left of mobile number with my direct number.  Lenna Gilford, PharmD, DPLA

## 2023-04-20 NOTE — Progress Notes (Signed)
   04/20/2023  Patient ID: Dominique Russell, female   DOB: August 09, 1955, 68 y.o.   MRN: [id number]  Pharmacy Quality Measure Review  This patient is appearing on a report for being at risk of failing TNM:  DM  Last documented A1c or GMI 8.4% on 07/05/2022  Sending patient MyChart message to schedule telephone visit for medication review and to check in on control of diabetes.  Lenna Gilford, PharmD, DPLA

## 2023-04-21 ENCOUNTER — Telehealth: Payer: Self-pay | Admitting: Medical-Surgical

## 2023-04-21 NOTE — Telephone Encounter (Signed)
 Copied from CRM [id number]. Topic: Appointments - Scheduling Inquiry for Clinic >> Apr 20, 2023  4:29 PM Nila Nephew wrote: Reason for CRM: Patient returning call to schedule an appointment with Pharmacist with PCP's office.

## 2023-04-26 DIAGNOSIS — M797 Fibromyalgia: Secondary | ICD-10-CM | POA: Diagnosis not present

## 2023-04-26 DIAGNOSIS — M170 Bilateral primary osteoarthritis of knee: Secondary | ICD-10-CM | POA: Diagnosis not present

## 2023-04-26 DIAGNOSIS — Z23 Encounter for immunization: Secondary | ICD-10-CM | POA: Diagnosis not present

## 2023-04-26 DIAGNOSIS — G894 Chronic pain syndrome: Secondary | ICD-10-CM | POA: Diagnosis not present

## 2023-04-26 DIAGNOSIS — M25561 Pain in right knee: Secondary | ICD-10-CM | POA: Diagnosis not present

## 2023-04-26 DIAGNOSIS — M549 Dorsalgia, unspecified: Secondary | ICD-10-CM | POA: Diagnosis not present

## 2023-04-27 ENCOUNTER — Telehealth: Payer: Self-pay

## 2023-04-27 NOTE — Progress Notes (Signed)
   04/27/2023  Patient ID: Dominique Russell, female   DOB: 22-Dec-1955, 68 y.o.   MRN: [id number]  Patient out reach attempt to schedule telephone visit for medication review and to address control of diabetes.  I was unable to reach the patient, but HIPAA compliant voicemail with my direct phone number was left.  I am also sending patient a MyChart message to attempt to schedule a telephone visit.  Lenna Gilford, PharmD, DPLA

## 2023-05-09 ENCOUNTER — Other Ambulatory Visit: Payer: Self-pay | Admitting: Medical-Surgical

## 2023-05-09 DIAGNOSIS — E1142 Type 2 diabetes mellitus with diabetic polyneuropathy: Secondary | ICD-10-CM | POA: Diagnosis not present

## 2023-05-09 DIAGNOSIS — M1A9XX0 Chronic gout, unspecified, without tophus (tophi): Secondary | ICD-10-CM

## 2023-05-09 DIAGNOSIS — E1169 Type 2 diabetes mellitus with other specified complication: Secondary | ICD-10-CM

## 2023-05-09 DIAGNOSIS — Z794 Long term (current) use of insulin: Secondary | ICD-10-CM | POA: Diagnosis not present

## 2023-05-09 DIAGNOSIS — E119 Type 2 diabetes mellitus without complications: Secondary | ICD-10-CM | POA: Diagnosis not present

## 2023-05-10 LAB — HEMOGLOBIN A1C: Hemoglobin A1C: 8

## 2023-05-11 NOTE — Progress Notes (Signed)
 Pharmacy Quality Measure Review  This patient is appearing on a report for TNM: DM  Last documented A1c or GMI 8.0% on 05/09/23  I have attempted to contacted patient x2 over the phone and x2 via MyChart message to schedule telephone visit to review medications and address control of T2DM.  Patient sees PCP 4/1, so I am routing message to provider with the following recommendations:  -Patient was seen by [facility] 3/18, and the following medication changes were made:  Trulicity 1.5mg  weekly was changed to [facility] 5mg  and patient was advised to taper Novolin 70/30 by 5 units as able (has been using 100 units BID with some hypoglycemia) -Verify access/affordability of Moun[facility]o -Review understanding of Novolog taper with goal to keep FBG 80-130 -Check FBG and 2hr post-prandial BG daily and record values -Patient will f/u with endo again in 6 months- I recommend monthly or quarterly PharmD engagement between visits  Lenna Gilford, PharmD, DPLA

## 2023-05-18 ENCOUNTER — Ambulatory Visit (INDEPENDENT_AMBULATORY_CARE_PROVIDER_SITE_OTHER)

## 2023-05-18 ENCOUNTER — Ambulatory Visit

## 2023-05-18 ENCOUNTER — Ambulatory Visit: Payer: 59 | Attending: Internal Medicine | Admitting: Internal Medicine

## 2023-05-18 ENCOUNTER — Encounter: Payer: Self-pay | Admitting: Internal Medicine

## 2023-05-18 VITALS — BP 131/84 | HR 112 | Resp 17 | Ht 63.0 in | Wt 246.0 lb

## 2023-05-18 DIAGNOSIS — M25572 Pain in left ankle and joints of left foot: Secondary | ICD-10-CM

## 2023-05-18 DIAGNOSIS — M25571 Pain in right ankle and joints of right foot: Secondary | ICD-10-CM

## 2023-05-18 DIAGNOSIS — M25561 Pain in right knee: Secondary | ICD-10-CM

## 2023-05-18 DIAGNOSIS — M899 Disorder of bone, unspecified: Secondary | ICD-10-CM | POA: Diagnosis not present

## 2023-05-18 DIAGNOSIS — M069 Rheumatoid arthritis, unspecified: Secondary | ICD-10-CM

## 2023-05-18 DIAGNOSIS — M25562 Pain in left knee: Secondary | ICD-10-CM

## 2023-05-18 DIAGNOSIS — G8929 Other chronic pain: Secondary | ICD-10-CM

## 2023-05-18 MED ORDER — TRIAMCINOLONE ACETONIDE 40 MG/ML IJ SUSP
40.0000 mg | INTRAMUSCULAR | Status: AC | PRN
  Administered 2023-05-18: 40 mg via INTRA_ARTICULAR

## 2023-05-18 MED ORDER — LIDOCAINE HCL 1 % IJ SOLN
2.0000 mL | INTRAMUSCULAR | Status: AC | PRN
Start: 2023-05-18 — End: 2023-05-18
  Administered 2023-05-18: 2 mL

## 2023-05-18 MED ORDER — LIDOCAINE HCL 1 % IJ SOLN
2.0000 mL | INTRAMUSCULAR | Status: AC | PRN
  Administered 2023-05-18: 2 mL

## 2023-05-18 NOTE — Patient Instructions (Signed)
 Strengthening exercises These exercises build strength and endurance in your shoulder. Endurance is the ability to use your muscles for a long time, even after they get tired. Scapular depression and retraction After you have practiced this exercise, try doing it without the arm support. Then, try doing it while standing instead of sitting. Sit on a stable chair. Support your arms in front of you with pillows, armrests, or a tabletop. Keep your elbows near the sides of your body. Gently move your shoulder blades down (scapular depression) and back toward your spine (retraction). Relax the muscles on the tops of your shoulders and in the back of your neck. Hold for __________ seconds. Slowly release the tension, and relax your muscles completely before you repeat the exercise. Repeat __________ times. Complete this exercise __________ times a day. Scapular depression  Sit in a stable chair that has armrests. Sit upright, with your feet flat on the floor. Put your hands on the armrests with your elbows bent and your fingers pointing forward. Your hands should be about even with the sides of your body. Push down on the armrests to lift yourself off the chair. Straighten your elbows and lift yourself up as much as you comfortably can. Move your shoulder blades down and back (scapular depression). Do not let your shoulders move up toward your ears. Keep your feet on the ground. As you get stronger, your feet should support less of your body weight as you do this exercise. Hold for __________ seconds. Slowly lower yourself back into the chair. Repeat __________ times. Complete this exercise __________ times a day. Scapular retraction  Sit in a stable chair without armrests, or stand. Secure an exercise band to a stable object in front of you so it is at shoulder height. Hold one end of the exercise band in each hand. Your palms should face each other. Straighten your elbows and lift your arms up  to shoulder height. Step back, away from the secured end of the exercise band, until the band stretches. Squeeze your shoulder blades together (scapular retraction) and move your elbows back behind you. Do not shrug your shoulders while you do this. Your elbows should stay at about chest or shoulder height. Keep your upper arms lifted, away from your sides. Hold for __________ seconds. Slowly return to the starting position. Repeat __________ times. Complete this exercise __________ times a day. Shoulder extension  Sit in a stable chair without armrests, or stand. Secure an exercise band to a stable object in front of you so it is at shoulder height. Hold one end of the exercise band in each hand. Your palms should face down. Straighten your elbows and lift your hands up to shoulder height. Step back, away from the secured end of the exercise band, until the band stretches. Squeeze your shoulder blades together and pull your hands down to the sides of your thighs (extension). Stop when your hands are straight down by your sides. Do not let your hands go behind your body. Hold for __________ seconds. Slowly return to the starting position. Repeat __________ times. Complete this exercise __________ times a day.

## 2023-05-18 NOTE — Progress Notes (Signed)
 Office Visit Note  Patient: Dominique Russell             Date of Birth: 11/09/1955           MRN: [id number]             PCP: Christen Butter, NP Referring: Christen Butter, NP Visit Date: 05/18/2023  Subjective:  Pain of the Right Ankle, Edema and Pain of the Left Ankle, Pain of the Left Knee, Pain of the Right Knee, Rash of the Left Elbow, and New Patient (Initial Visit)    Discussed the use of AI scribe software for clinical note transcription with the patient, who gave verbal consent to proceed.  History of Present Illness   Dominique Russell is a 67 year old female with history of rheumatoid arthritis and osteoarthritis who presents with worsening knee pain and swelling.  She was previously diagnosed with rheumatoid arthritis as well as fibromyalgia syndrome treated with combination of medications and joint injections.  It is not clear if she was ever on any specific disease modifying antirheumatic drug.  She had to establish with new rheumatology due to her provider retiring and saw Dr. Kathi Ludwig in 2014 at that evaluation did not indicate active inflammatory arthritis but symptoms seemed more due to her generalized osteoarthritis and chronic myofascial pain.  She has not been following regularly in rheumatology but sees pain management with pretty good benefit from combination of Cymbalta Lyrica Flexeril as needed Tylenol and injection treatments.  She has been experiencing worsening knee pain and swelling, which she attributes to her rheumatoid arthritis.  Apparently she has also been told she has bone-on-bone arthritis of the knees and previously been recommended to consider surgery which she is not interested in pursuing.  Despite previous treatments, the knee pain has been getting worse. She recalls receiving injections in her knees in the past, which provided temporary relief, but these treatments were discontinued she reports being due to insurance issues. She uses Voltaren gel topically on her  knees daily, which she finds helpful.  Chronic pain issues primarily affect her back, neck, shoulders, and knees. She is currently taking Cymbalta, which has helped reduce the frequency and severity of her headaches and pain in her knees. She has a history of fibromyalgia affecting her neck and shoulders, which has caused severe headaches in the past.  She has a history of neuropathy in her feet, with symptoms primarily affecting the soles. She describes the sensation as being worse in the bottom of her feet and does not report it affecting her legs.  She reports difficulty with mobility, particularly getting up and down from a chair, and mentions that her lower back and knees contribute to this difficulty. She lives in a split-level home and tries to minimize trips up and down the stairs by bringing necessary items upstairs in the morning.  She has experienced discoloration and itching on her elbow, which she attributes to dye from a pillow. The discoloration has been present for a while and becomes flaky when scratched.    Activities of Daily Living:  Patient reports morning stiffness for 2  hours.   Patient Reports nocturnal pain.  Difficulty dressing/grooming: Reports Difficulty climbing stairs: Reports Difficulty getting out of chair: Reports Difficulty using hands for taps, buttons, cutlery, and/or writing: Reports  Review of Systems  Constitutional:  Positive for fatigue.  HENT:  Positive for mouth dryness. Negative for mouth sores.   Eyes:  Positive for dryness.  Respiratory:  Positive  for shortness of breath.   Cardiovascular:  Positive for chest pain. Negative for palpitations.  Gastrointestinal:  Negative for blood in stool, constipation and diarrhea.  Endocrine: Positive for increased urination.  Genitourinary:  Positive for involuntary urination.  Musculoskeletal:  Positive for joint pain, gait problem, joint pain, joint swelling, myalgias, muscle weakness, morning stiffness,  muscle tenderness and myalgias.  Skin:  Positive for color change and rash. Negative for hair loss and sensitivity to sunlight.  Allergic/Immunologic: Negative for susceptible to infections.  Neurological:  Positive for tremors and headaches. Negative for dizziness.  Hematological:  Negative for swollen glands.  Psychiatric/Behavioral:  Positive for depressed mood and sleep disturbance. The patient is nervous/anxious.     PMFS History:  Patient Active Problem List   Diagnosis Date Noted   OAB (overactive bladder) 08/17/2021   Motion sickness 08/17/2021   Balance problems 08/17/2021   Frequent falls 08/17/2021   Polyneuropathy associated with underlying disease (HCC) 08/17/2021   Hyperuricemia 09/11/2020   Arthralgia of right foot 09/11/2020   Peripheral edema 09/11/2020   DDD (degenerative disc disease), cervical 01/23/2020   OSA (obstructive sleep apnea) 01/22/2019   Bone disorder 09/06/2018   Essential hypertension 03/17/2016   Psoriasis 03/17/2016   Long-term insulin use in type 2 diabetes (HCC) 02/09/2015   Class 3 severe obesity due to excess calories with serious comorbidity and body mass index (BMI) of 40.0 to 44.9 in adult (HCC) 05/16/2014   DM (diabetes mellitus) (HCC) 01/23/2013   Low back pain 01/23/2013   Shoulder pain 01/23/2013   Bilateral knee pain 12/26/2012   Bilateral shoulder pain 12/26/2012   Headache 09/26/2012   Benign essential tremor 09/26/2012   Fibromyalgia 03/02/2011   Generalized osteoarthritis of multiple sites 03/02/2011   Rheumatoid arthritis (HCC) 03/02/2011    Past Medical History:  Diagnosis Date   Diabetes (HCC)    Essential tremor    Fibromyalgia    Hypertension     Family History  Problem Relation Age of Onset   Depression Mother    Hypertension Mother    Kidney failure Father    Kidney failure Brother    Other Sister        MVA   Tremor Neg Hx    Past Surgical History:  Procedure Laterality Date   BREAST REDUCTION SURGERY      Social History   Social History Narrative   Patient lives at home with her daughter. She enjoys spending time with her family, going to church and watching television.   Immunization History  Administered Date(s) Administered   Fluad Quad(high Dose 65+) 11/20/2018, 12/01/2021   Fluad Trivalent(High Dose 65+) 11/22/2022   H1N1 01/24/2008   Influenza Split 11/27/2007, 11/14/2008, 12/02/2014   Influenza,inj,Quad PF,6+ Mos 11/23/2012, 10/26/2016, 12/27/2017, 11/25/2019   Influenza,inj,quad, With Preservative 01/14/2014   Influenza-Unspecified 12/13/2010, 11/01/2011, 11/23/2012, 11/22/2015, 12/17/2015, 01/26/2021   Moderna Covid-19 Vaccine Bivalent Booster 33yrs & up 01/26/2021   PFIZER(Purple Top)SARS-COV-2 Vaccination 06/10/2019, 07/01/2019, 01/23/2020   Pneumococcal Conjugate-13 05/21/2019   Pneumococcal Polysaccharide-23 03/29/2011, 09/14/2016   Tdap 09/14/2016   Zoster Recombinant(Shingrix) 08/01/2018, 10/23/2018   Zoster, Live 01/14/2014     Objective: Vital Signs: BP 131/84 (BP Location: Left Arm, Patient Position: Sitting, Cuff Size: Normal)   Pulse (!) 112   Resp 17   Ht 5\' 3"  (1.6 m)   Wt 246 lb (111.6 kg)   BMI 43.58 kg/m    Physical Exam Constitutional:      Appearance: She is obese.  Eyes:  Conjunctiva/sclera: Conjunctivae normal.  Cardiovascular:     Rate and Rhythm: Normal rate and regular rhythm.  Pulmonary:     Effort: Pulmonary effort is normal.     Breath sounds: Normal breath sounds.  Musculoskeletal:     Comments: 1+ pitting edema in ankles with sensitivity to pressure no overlying skin rash or discoloration  Lymphadenopathy:     Cervical: No cervical adenopathy.  Skin:    General: Skin is warm and dry.     Findings: Rash present.     Comments: Hyperpigmented patch on the left forearm distal to elbow with irregular but sharply demarcated border, with a area of hypopigmentation in the center Scattered round excoriated spots on both shins   Neurological:     Mental Status: She is alert.  Psychiatric:        Mood and Affect: Mood normal.      Musculoskeletal Exam:  Tenderness to pressure throughout cervical and thoracic paraspinal muscles extending across scapula and over upper arms Shoulder guarding against overhead abduction, resting position with right shoulder more anterior and lower Elbows full ROM no tenderness or swelling Wrists full ROM no tenderness or swelling Fingers full ROM no tenderness or swelling Slightly decreased flexion extension range of motion both knees, patellofemoral crepitus, no palpable effusions No ankle tenderness to pressure or palpable synovitis, there is some overlying soft tissue swelling Decreased ankle flexion range of motion, low arch with increase in overpronation Tenderness to pressure on plantar side anterior to calcaneus with a palpable nodule at about midfoot below arch  Investigation: No additional findings.  Imaging: XR KNEE 3 VIEW RIGHT Result Date: 05/18/2023 X-ray right knee 3 views There is mild medial and lateral compartment joint space narrowing.  Appear to be small marginal enthesophytes on lateral side.  Possibly some calcification at the medial border at meniscus.  Patellofemoral with worse marginal osteophytes much more advanced on lateral border.  Also with large superior and inferior patellar enthesophytes. Impression Tricompartmental osteoarthritis most advanced in patellofemoral compartment and with large patellar enthesophytes  XR KNEE 3 VIEW LEFT Result Date: 05/18/2023 X-ray left knee 3 views There is mild medial compartment joint space narrowing lateral appears preserved.  No significant marginal osteophytes.  Patellofemoral joint with marginal osteophytes on both sides and there are large patellar enthesophytes.  No visible effusion or abnormal calcifications. Impression Osteoarthritis most advanced in the patellofemoral compartment also with large patellar  enthesophyte  XR Ankle 2 Views Left Result Date: 05/18/2023 X-ray left foot 2 views Tibiotalar joint space alignment appears normal.  There are moderate posterior and plantar calcaneal enthesophytes.  Midfoot joint spaces appear overall preserved with minimal dorsal osteophytes.  Slightly abnormal but no severe loss of normal midfoot joint architecture. Impression Mild degenerative changes, calcaneal enthesophytes  XR Ankle 2 Views Right Result Date: 05/18/2023 X-ray right ankle 2 views Tibiotalar joint appears normal.  There are posterior and plantar calcaneal enthesophytes.  Midfoot joints without significant dorsal bone spurring.  Some subchondral cystic change.  No obvious loss of normal midfoot joint architecture. Impression Mild degenerative changes in the ankle and midfoot, calcaneal enthesophytes   Recent Labs: Lab Results  Component Value Date   WBC 5.6 03/24/2022   HGB 12.3 03/24/2022   PLT 194 03/24/2022   NA 140 11/22/2022   K 4.8 11/22/2022   CL 104 11/22/2022   CO2 23 11/22/2022   GLUCOSE 181 (H) 11/22/2022   BUN 22 11/22/2022   CREATININE 0.84 11/22/2022   BILITOT <0.2 11/22/2022  ALKPHOS 82 11/22/2022   AST 23 11/22/2022   ALT 18 11/22/2022   PROT 6.4 11/22/2022   ALBUMIN 4.0 11/22/2022   CALCIUM 9.3 11/22/2022   GFRAA 73 06/30/2020    Speciality Comments: No specialty comments available.  Procedures:  Large Joint Inj: bilateral knee on 05/18/2023 3:05 PM Indications: pain Details: 27 G 1.5 in needle, anteromedial approach Medications (Right): 2 mL lidocaine 1 %; 40 mg triamcinolone acetonide 40 MG/ML Medications (Left): 2 mL lidocaine 1 %; 40 mg triamcinolone acetonide 40 MG/ML Outcome: tolerated well, no immediate complications Procedure, treatment alternatives, risks and benefits explained, specific risks discussed. Consent was given by the patient. Immediately prior to procedure a time out was called to verify the correct patient, procedure, equipment,  support staff and site/side marked as required. Patient was prepped and draped in the usual sterile fashion.     Allergies: Amlodipine   Assessment / Plan:     Visit Diagnoses: Rheumatoid arthritis, involving unspecified site, unspecified whether rheumatoid factor present (HCC) - Plan: XR KNEE 3 VIEW RIGHT, XR KNEE 3 VIEW LEFT, XR Ankle 2 Views Left, XR Ankle 2 Views Right, Rheumatoid factor, Cyclic citrul peptide antibody, IgG, Sedimentation rate, C-reactive protein, C3 and C4 Rheumatoid arthritis with neck, shoulder, and back pain, likely due to poor posture and repetitive motion.  More structural pain involved in lower extremity problems evaluated mostly for the knees and ankles today.  There is significant osteoarthritis of both knees but is most advanced in the patellofemoral compartment also with large enthesophytes suggesting chronic enthesopathy or tendinitis.  In the ankles there is not a severe drop of the midfoot arch but does have changes suggesting plantar fascial fibromatosis. - Provided exercises to improve posture and strengthen back muscles. - Provided exercises for plantar fasciitis - Consider referral to physical therapy. - Checking labs today including RF CCP sed rate CRP and complements for evidence of RA disease activity - Bilateral knee steroid injection today for primary osteoarthritis  Knee Pain Chronic knee pain with worsening symptoms. She wishes to avoid surgery.  Previously good response to injections has not had them in a while now, review of chronic pain management note performing genicular nerve ablations with moderate success.  Peripheral Edema Chronic ankle and leg swelling likely due to diabetes and venous insufficiency.  She reports previous compressive socks were intolerable due to being excessively tight. - Recommend low-pressure compression socks (8-10 mmHg).  Plantar Fasciitis Nodule on the bottom of the foot consistent with plantar fasciitis, causing  pain upon pressure.     Orders: Orders Placed This Encounter  Procedures   Large Joint Inj   XR KNEE 3 VIEW RIGHT   XR KNEE 3 VIEW LEFT   XR Ankle 2 Views Left   XR Ankle 2 Views Right   Rheumatoid factor   Cyclic citrul peptide antibody, IgG   Sedimentation rate   C-reactive protein   C3 and C4   No orders of the defined types were placed in this encounter.    Follow-Up Instructions: Return in about 3 months (around 08/18/2023) for New pt ?RA/OA b/l knee inj f/u 3mos.   Fuller Plan, MD  Note - This record has been created using [employer].  Chart creation errors have been sought, but may not always  have been located. Such creation errors do not reflect on  the standard of medical care.

## 2023-05-20 LAB — RHEUMATOID FACTOR: Rheumatoid fact SerPl-aCnc: <10 [IU]/mL (ref <14)

## 2023-05-20 LAB — C-REACTIVE PROTEIN: CRP: 4.4 mg/L (ref <8.0)

## 2023-05-20 LAB — SEDIMENTATION RATE: Sed Rate: 22 mm/h (ref 0–30)

## 2023-05-20 LAB — C3 AND C4
C3 Complement: 191 mg/dL (ref 83–193)
C4 Complement: 34 mg/dL (ref 15–57)

## 2023-05-20 LAB — CYCLIC CITRUL PEPTIDE ANTIBODY, IGG: Cyclic Citrullin Peptide Ab: <16 U

## 2023-05-21 ENCOUNTER — Other Ambulatory Visit: Payer: Self-pay | Admitting: Medical-Surgical

## 2023-05-23 ENCOUNTER — Ambulatory Visit (INDEPENDENT_AMBULATORY_CARE_PROVIDER_SITE_OTHER): Payer: 59 | Admitting: Medical-Surgical

## 2023-05-23 ENCOUNTER — Encounter: Payer: Self-pay | Admitting: Medical-Surgical

## 2023-05-23 VITALS — BP 132/77 | HR 124 | Resp 20 | Ht 63.0 in | Wt 242.1 lb

## 2023-05-23 DIAGNOSIS — E1142 Type 2 diabetes mellitus with diabetic polyneuropathy: Secondary | ICD-10-CM | POA: Diagnosis not present

## 2023-05-23 DIAGNOSIS — F419 Anxiety disorder, unspecified: Secondary | ICD-10-CM | POA: Diagnosis not present

## 2023-05-23 DIAGNOSIS — N1831 Chronic kidney disease, stage 3a: Secondary | ICD-10-CM | POA: Diagnosis not present

## 2023-05-23 DIAGNOSIS — Z6841 Body Mass Index (BMI) 40.0 and over, adult: Secondary | ICD-10-CM

## 2023-05-23 DIAGNOSIS — I10 Essential (primary) hypertension: Secondary | ICD-10-CM | POA: Diagnosis not present

## 2023-05-23 DIAGNOSIS — F0150 Vascular dementia without behavioral disturbance: Secondary | ICD-10-CM

## 2023-05-23 DIAGNOSIS — E66813 Obesity, class 3: Secondary | ICD-10-CM | POA: Diagnosis not present

## 2023-05-23 DIAGNOSIS — Z794 Long term (current) use of insulin: Secondary | ICD-10-CM | POA: Diagnosis not present

## 2023-05-23 MED ORDER — PROPRANOLOL HCL 10 MG PO TABS: 10.0000 mg | ORAL_TABLET | Freq: Two times a day (BID) | ORAL | 1 refills | Status: DC | PRN

## 2023-05-23 NOTE — Progress Notes (Unsigned)
        Established patient visit  History, exam, impression, and plan:  1. Essential hypertension (Primary) Very pleasant 68 year old female presenting today with a history of hypertension that is currently well-managed with valsartan 320 mg and hydrochlorothiazide 25 mg daily.  Tolerates both medications well without side effects.  Not checking blood pressures at home.  Does try to eat a low-sodium diet whenever possible.  Minimal physical activity.  Denies concerning symptoms outside of chronic swelling of the left ankle.  Requesting to discontinue hydrochlorothiazide and switch to something else because she heard that it can cause long-term complications for your heart and kidneys.  Cardiopulmonary exam is normal today.  Blood pressure is at goal.  Continue valsartan and hydrochlorothiazide as prescribed.  Reviewed alternative medications such as chlorthalidone, spironolactone, and Lasix.  Noted that all of these can interfere with electrolytes and kidney function and that we would have to recheck labs 1 to 2 weeks after switching over.  After conversation, she is amenable to continuing hydrochlorothiazide as previously prescribed.  2. Type 2 diabetes mellitus with diabetic polyneuropathy, with long-term current use of insulin (HCC) Managed by endocrinology.  Most recent hemoglobin A1c at 8% on 05/10/2023.  Foot exam completed today with no areas of neuropathy. - [facility] Diabetes Foot Exam  3. Class 3 severe obesity due to excess calories with serious comorbidity and body mass index (BMI) of 40.0 to 44.9 in adult [facility]) Discussed the importance of weight loss to help with chronic conditions and generalized pain.  Mobility is limited and she does not currently exercise.  Would recommend she start some gentle seated exercises and progress as tolerated.  Also recommend working on dietary modification.  She is on Mounjaro 5 mg weekly which should help with weight loss.  4. Anxiety Reports that she is  interested in having Xanax prescribed for her.  She took this in the past for anxiety on an as-needed basis but admits that she was taking it 1-2 times daily every day.  Reports that she gets very emotional in certain situations which can become uncontrollable.  On review of her chart, she was taking Xanax 1 mg tablets.  Reviewed recommendations for mood management and limited use of benzodiazepines due to tolerance and dependence.  She is already on Cymbalta 60 mg daily to treat fibromyalgia and chronic pain.  Used trazodone to help with insomnia in the past.  Reviewed alternative options to help with anxiety.  She typically has an elevated heart rate and her blood pressure is on the upper border of normal.  Plan to start propranolol 10-20 mg twice daily as needed for acute anxiety.  5. Vascular dementia without behavioral disturbance (HCC) Managed by neurology.  6. Stage 3a chronic kidney disease (HCC) Renal function checked 11/22/2022 shows great improvement in kidney function with all normal BUN, creatinine, and GFR.  Continue to avoid nephrotoxic medications and work to stay very well-hydrated.  Procedures performed this visit: None.  Return in about 4 weeks (around 06/20/2023) for HTN/anxiety follow up.  __________________________________ Thayer Ohm, DNP, APRN, FNP-BC Primary Care and Sports Medicine [facility] [place]

## 2023-05-23 NOTE — Patient Instructions (Signed)
 How to Take Your Blood Pressure Blood pressure measures how strongly your blood is pressing against the walls of your arteries. Arteries are blood vessels that carry blood from your heart throughout your body. You can take your blood pressure at home with a machine. You may need to check your blood pressure at home: To check if you have high blood pressure (hypertension). To check your blood pressure over time. To make sure your blood pressure medicine is working. Supplies needed: Blood pressure machine, or monitor. A chair to sit in. This should be a chair where you can sit upright with your back supported. Do not sit on a soft couch or an armchair. Table or desk. Small notebook. Pencil or pen. How to prepare Avoid these things for 30 minutes before checking your blood pressure: Having drinks with caffeine in them, such as coffee or tea. Drinking alcohol. Eating. Smoking. Exercising. Do these things five minutes before checking your blood pressure: Go to the bathroom and pee (urinate). Sit in a chair. Be quiet. Do not talk. How to take your blood pressure Follow the instructions that came with your machine. If you have a digital blood pressure monitor, these may be the instructions: Sit up straight. Place your feet on the floor. Do not cross your ankles or legs. Rest your left arm at the level of your heart. You may rest it on a table, desk, or chair. Pull up your shirt sleeve. Wrap the blood pressure cuff around the upper part of your left arm. The cuff should be 1 inch (2.5 cm) above your elbow. It is best to wrap the cuff around bare skin. Fit the cuff snugly around your arm, but not too tightly. You should be able to place only one finger between the cuff and your arm. Place the cord so that it rests in the bend of your elbow. Press the power button. Sit quietly while the cuff fills with air and loses air. Write down the numbers on the screen. Wait 2-3 minutes and then repeat  steps 1-10. What do the numbers mean? Two numbers make up your blood pressure. The first number is called systolic pressure. The second is called diastolic pressure. An example of a blood pressure reading is "120 over 80" (or 120/80). If you are an adult and do not have a medical condition, use this guide to find out if your blood pressure is normal: Normal First number: below 120. Second number: below 80. Elevated First number: 120-129. Second number: below 80. Hypertension stage 1 First number: 130-139. Second number: 80-89. Hypertension stage 2 First number: 140 or above. Second number: 90 or above. Your blood pressure is above normal even if only the first or only the second number is above normal. Follow these instructions at home: Medicines Take over-the-counter and prescription medicines only as told by your doctor. Tell your doctor if your medicine is causing side effects. General instructions Check your blood pressure as often as your doctor tells you to. Check your blood pressure at the same time every day. Take your monitor to your next doctor's appointment. Your doctor will: Make sure you are using it correctly. Make sure it is working right. Understand what your blood pressure numbers should be. Keep all follow-up visits. General tips You will need a blood pressure machine or monitor. Your doctor can suggest a monitor. You can buy one at a drugstore or online. When choosing one: Choose one with an arm cuff. Choose one that wraps around your  upper arm. Only one finger should fit between your arm and the cuff. Do not choose one that measures your blood pressure from your wrist or finger. Where to find more information American Heart Association: www.heart.org Contact a doctor if: Your blood pressure keeps being high. Your blood pressure is suddenly low. Get help right away if: Your first blood pressure number is higher than 180. Your second blood pressure number is  higher than 120. These symptoms may be an emergency. Do not wait to see if the symptoms will go away. Get help right away. Call 911. Summary Check your blood pressure at the same time every day. Avoid caffeine, alcohol, smoking, and exercise for 30 minutes before checking your blood pressure. Make sure you understand what your blood pressure numbers should be. This information is not intended to replace advice given to you by your health care provider. Make sure you discuss any questions you have with your health care provider. Document Revised: 10/22/2020 Document Reviewed: 10/22/2020 Elsevier Patient Education  2024 [employer].

## 2023-05-25 DIAGNOSIS — N1831 Chronic kidney disease, stage 3a: Secondary | ICD-10-CM | POA: Insufficient documentation

## 2023-05-25 DIAGNOSIS — F419 Anxiety disorder, unspecified: Secondary | ICD-10-CM | POA: Insufficient documentation

## 2023-05-25 DIAGNOSIS — F0150 Vascular dementia without behavioral disturbance: Secondary | ICD-10-CM | POA: Insufficient documentation

## 2023-06-03 ENCOUNTER — Other Ambulatory Visit: Payer: Self-pay | Admitting: Medical-Surgical

## 2023-06-03 DIAGNOSIS — M1A9XX0 Chronic gout, unspecified, without tophus (tophi): Secondary | ICD-10-CM

## 2023-06-03 DIAGNOSIS — E1169 Type 2 diabetes mellitus with other specified complication: Secondary | ICD-10-CM

## 2023-06-16 ENCOUNTER — Other Ambulatory Visit: Payer: Self-pay | Admitting: Medical-Surgical

## 2023-06-20 ENCOUNTER — Encounter: Payer: Self-pay | Admitting: Medical-Surgical

## 2023-06-20 ENCOUNTER — Ambulatory Visit (INDEPENDENT_AMBULATORY_CARE_PROVIDER_SITE_OTHER): Admitting: Medical-Surgical

## 2023-06-20 VITALS — BP 113/72 | HR 77 | Resp 20 | Ht 63.0 in | Wt 257.0 lb

## 2023-06-20 DIAGNOSIS — R600 Localized edema: Secondary | ICD-10-CM

## 2023-06-20 DIAGNOSIS — I10 Essential (primary) hypertension: Secondary | ICD-10-CM

## 2023-06-20 DIAGNOSIS — F419 Anxiety disorder, unspecified: Secondary | ICD-10-CM | POA: Diagnosis not present

## 2023-06-20 MED ORDER — FUROSEMIDE 20 MG PO TABS: ORAL_TABLET | ORAL | 0 refills | Status: AC

## 2023-06-20 NOTE — Progress Notes (Unsigned)
   Established patient visit  History, exam, impression, and plan:  1. Cough, unspecified type 2. Sore throat Pleasant 68 year old female presenting today with reports of upper respiratory symptoms.  Notes that this started approximately 5 days ago with her nose and eyes burning.  On Saturday, she felt unwell and by Sunday she notes that she felt awful.  Her throat has been sore and she has been coughing.  Notes that her cough has been occasionally productive of small amounts of pink-tinged sputum, usually in the morning.  Her left ear has now developed pressure/discomfort and she has significant sinus congestion.  She has tried drinking hot tea and increasing her fluid consumption.  She has also used Chloraseptic for the sore throat.  Continues to have significant issues with hoarseness.  She saw ENT and they recommended just using Atrovent nasal spray twice daily and follow-up with them after a couple of months.  POCT strep, flu, and COVID testing all negative today.  Below for physical exam. - POCT rapid strep A - POCT Influenza A/B - POC COVID-19  3. Viral URI with cough Despite negative testing, suspect that her symptoms are truly related to a viral URI.  Discussed the timeline for resolution of a viral illness since symptoms can last 7 to 14 days.  With her severe hoarseness and significant sinus congestion, treating with Decadron 4 mg twice daily.  Adding Tussionex for cough suppression.  Okay to use Tylenol/ibuprofen for fever/discomfort.  Continue conservative measures at home.  If improvement in symptoms not noted over the next 2 to 3 days or symptoms improved but quickly worsen again, consider adding antibiotic therapy for secondary bacterial infection.   Procedures performed this visit: None.  Return if symptoms worsen or fail to improve.  __________________________________ Thayer Ohm, DNP, APRN, FNP-BC Primary Care and Sports Medicine [facility] [place]

## 2023-06-27 ENCOUNTER — Telehealth: Payer: Self-pay

## 2023-06-27 NOTE — Telephone Encounter (Signed)
 Patient was identified as falling into the [place] Measure - Diabetes.   Patient was: Left voicemail to schedule with primary care provider.

## 2023-07-05 DIAGNOSIS — G8929 Other chronic pain: Secondary | ICD-10-CM | POA: Diagnosis not present

## 2023-07-05 DIAGNOSIS — M25561 Pain in right knee: Secondary | ICD-10-CM | POA: Diagnosis not present

## 2023-07-05 DIAGNOSIS — M25562 Pain in left knee: Secondary | ICD-10-CM | POA: Diagnosis not present

## 2023-07-05 DIAGNOSIS — M549 Dorsalgia, unspecified: Secondary | ICD-10-CM | POA: Diagnosis not present

## 2023-07-05 DIAGNOSIS — M797 Fibromyalgia: Secondary | ICD-10-CM | POA: Diagnosis not present

## 2023-07-05 DIAGNOSIS — G894 Chronic pain syndrome: Secondary | ICD-10-CM | POA: Diagnosis not present

## 2023-07-20 ENCOUNTER — Other Ambulatory Visit: Payer: Self-pay | Admitting: Medical-Surgical

## 2023-07-25 ENCOUNTER — Encounter: Payer: Self-pay | Admitting: Internal Medicine

## 2023-07-25 ENCOUNTER — Ambulatory Visit: Attending: Internal Medicine | Admitting: Internal Medicine

## 2023-07-25 VITALS — BP 144/84 | HR 105 | Resp 16 | Ht 63.0 in | Wt 245.0 lb

## 2023-07-25 DIAGNOSIS — M170 Bilateral primary osteoarthritis of knee: Secondary | ICD-10-CM | POA: Diagnosis not present

## 2023-07-25 NOTE — Patient Instructions (Signed)
 Straight leg raises This exercise strengthens the muscles in front of your thigh (quadriceps) and the muscles that move your hips (hip flexors). Lie on your back with your left / right leg extended and your other knee bent. Tense the muscles in the front of your left / right thigh. You should see your kneecap slide up or see increased dimpling just above the knee. Your thigh may even shake a bit. Keep these muscles tight as you raise your leg 4-6 inches (10-15 cm) off the floor. Do not let your knee bend. Hold this position for __________ seconds. Keep these muscles tense as you lower your leg. Relax your muscles slowly and completely after each repetition. Repeat __________ times. Complete this exercise __________ times a day. Straight leg raises, side-lying This exercise strengthens the muscles that rotate the leg at the hip and move it away from your body (hip abductors). Lie on your side with your left / right leg in the top position. Lie so your head, shoulder, knee, and hip line up. You may bend your bottom knee to help you keep your balance. Roll your hips slightly forward so your hips are stacked directly over each other and your left / right knee is facing forward. Leading with your heel, lift your top leg 4-6 inches (10-15 cm). You should feel the muscles in your outer hip lifting. Do not let your foot drift forward. Do not let your knee roll toward the ceiling. Hold this position for __________ seconds. Slowly return your leg to the starting position. Let your muscles relax completely after each repetition. Repeat __________ times. Complete this exercise __________ times a day.

## 2023-07-25 NOTE — Progress Notes (Signed)
 Office Visit Note  Patient: Dominique Russell             Date of Birth: 10/18/1955           MRN: [id number]             PCP: Cherre Cornish, NP Referring: Cherre Cornish, NP Visit Date: 07/25/2023   Subjective:  Follow-up (Patient states the shots in both knees did not work. )   Discussed the use of AI scribe software for clinical note transcription with the patient, who gave verbal consent to proceed.  History of Present Illness   Dominique Russell is a 68 year old female with osteoarthritis who presents with knee pain.  She experiences significant knee pain, primarily in the anterior aspect of her knees, described as a burning and stinging sensation. The pain is localized to her patellae, where bone spurs have been identified. Movement, especially knee flexion, exacerbates the pain and results in a grinding sensation due to bone-on-bone contact. She describes the pain as feeling like 'something just stinging like a bee might be in there.'  She has a history of both rheumatoid arthritis and osteoarthritis. Recent blood tests for rheumatoid arthritis markers, including sedimentation rate and C-reactive protein, returned normal results. In March, she received a steroid injection in her knees, which did not provide relief. She takes ibuprofen 800 mg occasionally, which helps alleviate the pain.  She has a history of urinary incontinence, particularly when coughing or sneezing, which she attributes to her kidney issues. She tries to limit her fluid intake to manage this condition. No current swelling in the knees.      Previous HPI 05/18/23 [name] is a 68 year old female with history of rheumatoid arthritis and osteoarthritis who presents with worsening knee pain and swelling.  She was previously diagnosed with rheumatoid arthritis as well as fibromyalgia syndrome treated with combination of medications and joint injections.  It is not clear if she was ever on any specific disease  modifying antirheumatic drug.  She had to establish with new rheumatology due to her provider retiring and saw Dr. Henrine Logan in 2014 at that evaluation did not indicate active inflammatory arthritis but symptoms seemed more due to her generalized osteoarthritis and chronic myofascial pain.  She has not been following regularly in rheumatology but sees pain management with pretty good benefit from combination of Cymbalta  Lyrica Flexeril  as needed Tylenol and injection treatments.   She has been experiencing worsening knee pain and swelling, which she attributes to her rheumatoid arthritis.  Apparently she has also been told she has bone-on-bone arthritis of the knees and previously been recommended to consider surgery which she is not interested in pursuing.  Despite previous treatments, the knee pain has been getting worse. She recalls receiving injections in her knees in the past, which provided temporary relief, but these treatments were discontinued she reports being due to insurance issues. She uses Voltaren gel topically on her knees daily, which she finds helpful.   Chronic pain issues primarily affect her back, neck, shoulders, and knees. She is currently taking Cymbalta , which has helped reduce the frequency and severity of her headaches and pain in her knees. She has a history of fibromyalgia affecting her neck and shoulders, which has caused severe headaches in the past.   She has a history of neuropathy in her feet, with symptoms primarily affecting the soles. She describes the sensation as being worse in the bottom of her feet and does  not report it affecting her legs.   She reports difficulty with mobility, particularly getting up and down from a chair, and mentions that her lower back and knees contribute to this difficulty. She lives in a split-level home and tries to minimize trips up and down the stairs by bringing necessary items upstairs in the morning.   She has experienced discoloration and  itching on her elbow, which she attributes to dye from a pillow. The discoloration has been present for a while and becomes flaky when scratched.    Review of Systems  Constitutional:  Positive for fatigue.  HENT:  Positive for mouth dryness. Negative for mouth sores.   Eyes:  Positive for dryness.  Respiratory:  Negative for shortness of breath.   Cardiovascular:  Negative for chest pain and palpitations.  Gastrointestinal:  Positive for constipation. Negative for blood in stool and diarrhea.  Endocrine: Negative for increased urination.  Genitourinary:  Positive for involuntary urination.  Musculoskeletal:  Positive for joint pain, gait problem, joint pain, myalgias, muscle weakness, morning stiffness, muscle tenderness and myalgias. Negative for joint swelling.  Skin:  Positive for sensitivity to sunlight. Negative for color change, rash and hair loss.  Allergic/Immunologic: Negative for susceptible to infections.  Neurological:  Negative for dizziness and headaches.  Hematological:  Negative for swollen glands.  Psychiatric/Behavioral:  Positive for depressed mood. Negative for sleep disturbance. The patient is nervous/anxious.     PMFS History:  Patient Active Problem List   Diagnosis Date Noted   Anxiety 05/25/2023   Vascular dementia without behavioral disturbance (HCC) 05/25/2023   Stage 3a chronic kidney disease (HCC) 05/25/2023   Impaired functional mobility, balance, gait, and endurance 05/06/2022   OAB (overactive bladder) 08/17/2021   Motion sickness 08/17/2021   Balance problems 08/17/2021   Frequent falls 08/17/2021   Polyneuropathy associated with underlying disease (HCC) 08/17/2021   Hyperuricemia 09/11/2020   Arthralgia of right foot 09/11/2020   Peripheral edema 09/11/2020   DDD (degenerative disc disease), cervical 01/23/2020   OSA (obstructive sleep apnea) 01/22/2019   Bone disorder 09/06/2018   Essential hypertension 03/17/2016   Psoriasis 03/17/2016    Long-term insulin  use in type 2 diabetes (HCC) 02/09/2015   Class 3 severe obesity due to excess calories with serious comorbidity and body mass index (BMI) of 40.0 to 44.9 in adult 05/16/2014   DM (diabetes mellitus) (HCC) 01/23/2013   Low back pain 01/23/2013   Shoulder pain 01/23/2013   Bilateral knee pain 12/26/2012   Bilateral shoulder pain 12/26/2012   Headache 09/26/2012   Benign essential tremor 09/26/2012   Fibromyalgia 03/02/2011   Generalized osteoarthritis of multiple sites 03/02/2011   Rheumatoid arthritis (HCC) 03/02/2011    Past Medical History:  Diagnosis Date   Diabetes (HCC)    Essential tremor    Fibromyalgia    Hypertension     Family History  Problem Relation Age of Onset   Depression Mother    Hypertension Mother    Kidney failure Father    Kidney failure Brother    Other Sister        MVA   Tremor Neg Hx    Past Surgical History:  Procedure Laterality Date   BREAST REDUCTION SURGERY     Social History   Social History Narrative   Patient lives at home with her daughter. She enjoys spending time with her family, going to church and watching television.   Immunization History  Administered Date(s) Administered   Fluad Quad(high Dose 65+) 11/20/2018, 12/01/2021  Fluad Trivalent(High Dose 65+) 11/22/2022   Fluzone Influenza virus vaccine,trivalent (IIV3), split virus 12/02/2014   H1N1 01/24/2008   Influenza Split 11/27/2007, 11/14/2008   Influenza,inj,Quad PF,6+ Mos 11/23/2012, 10/26/2016, 12/27/2017, 11/25/2019   Influenza,inj,quad, With Preservative 01/14/2014   Influenza-Unspecified 12/13/2010, 11/01/2011, 11/23/2012, 11/22/2015, 12/17/2015, 01/26/2021   Moderna Covid-19 Vaccine  Bivalent Booster 68yrs & up 01/26/2021   PFIZER(Purple Top)SARS-COV-2 Vaccination 06/10/2019, 07/01/2019, 01/23/2020   Pneumococcal Conjugate-13 05/21/2019   Pneumococcal Polysaccharide-23 03/29/2011, 09/14/2016   Tdap 09/14/2016   Zoster Recombinant(Shingrix )  08/01/2018, 10/23/2018   Zoster, Live 01/14/2014     Objective: Vital Signs: BP (!) 144/84 (BP Location: Left Arm, Patient Position: Sitting, Cuff Size: Normal)   Pulse (!) 105   Resp 16   Ht 5' 3 (1.6 m)   Wt 245 lb (111.1 kg)   BMI 43.40 kg/m    Physical Exam Constitutional:      Appearance: She is obese.   Cardiovascular:     Rate and Rhythm: Normal rate and regular rhythm.  Pulmonary:     Effort: Pulmonary effort is normal.     Breath sounds: Normal breath sounds.   Skin:    General: Skin is warm and dry.     Comments: Round flat excoriated skin patches on anterior shins bilateral   Neurological:     Mental Status: She is alert.   Psychiatric:        Mood and Affect: Mood normal.      Musculoskeletal Exam:  Shoulder guarding against overhead abduction, resting position with right shoulder more anterior and lower Elbows full ROM no tenderness or swelling Fingers full ROM no tenderness or swelling Slightly decreased flexion extension range of motion both knees, patellofemoral crepitus, tenderness along superior edge of left knee anteriorly without a palpable effusion No ankle tenderness to pressure or palpable synovitis, there is some overlying soft tissue swelling  Investigation: No additional findings.  Imaging: No results found.  Recent Labs: Lab Results  Component Value Date   WBC 5.6 03/24/2022   HGB 12.3 03/24/2022   PLT 194 03/24/2022   NA 140 11/22/2022   K 4.8 11/22/2022   CL 104 11/22/2022   CO2 23 11/22/2022   GLUCOSE 181 (H) 11/22/2022   BUN 22 11/22/2022   CREATININE 0.84 11/22/2022   BILITOT <0.2 11/22/2022   ALKPHOS 82 11/22/2022   AST 23 11/22/2022   ALT 18 11/22/2022   PROT 6.4 11/22/2022   ALBUMIN 4.0 11/22/2022   CALCIUM  9.3 11/22/2022   GFRAA 73 06/30/2020    Speciality Comments: No specialty comments available.  Procedures:  No procedures performed Allergies: Amlodipine  and Gabapentin   Assessment / Plan:     Visit  Diagnoses: Bilateral primary osteoarthritis of knee - Plan: AMB referral to sports medicine Chronic osteoarthritis with patellar involvement, bone spurs, and cartilage wear. X-rays confirm significant changes. Blood tests negative for rheumatoid arthritis markers. - Refer to orthopedic or sports medicine specialist for evaluation and potential viscosupplementation. - Provide low-impact quadriceps strengthening exercises.  Chronic kidney disease, unspecified Chronic kidney disease with unspecified cause. High-dose ibuprofen poses nephrotoxicity risk, especially with existing renal issues and urinary incontinence. - Advise against long-term high-dose ibuprofen due to nephrotoxicity risk.    Orders: Orders Placed This Encounter  Procedures   AMB referral to sports medicine   No orders of the defined types were placed in this encounter.    Follow-Up Instructions: No follow-ups on file.   Matt Song, MD  Note - This record has been created  using Animal nutritionist.  Chart creation errors have been sought, but may not always  have been located. Such creation errors do not reflect on  the standard of medical care.

## 2023-07-26 ENCOUNTER — Telehealth: Payer: Self-pay

## 2023-07-26 NOTE — Telephone Encounter (Signed)
 Copied from CRM [id number]. Topic: Clinical - Prescription Issue >> Jul 26, 2023 10:04 AM Dominique Russell wrote: Reason for CRM: Patient calling in for a replacement for propranolol  (INDERAL ) 10 MG tablet because it causes constipation.   Would also like to refill the old blood pressure medication she had before but could remember the name. Please give call patient back #[phone] (H)

## 2023-07-26 NOTE — Telephone Encounter (Signed)
 Copied from CRM [id number]. Topic: Clinical - Medication Question >> Jul 26, 2023  2:54 PM Carrielelia G wrote: Patient Dominique Russell stated she will no longer be taking the medication: propranolol  (INDERAL ) 10 MG tablet it causes constipation.  So she would like to go back to the old bp medication (does not remember the name) please advise.   She also states she will need it soon.

## 2023-07-26 NOTE — Telephone Encounter (Signed)
 Attempted call to patient on listed home # - requesting a return call.

## 2023-07-27 NOTE — Telephone Encounter (Signed)
 Patient informed.

## 2023-07-27 NOTE — Telephone Encounter (Signed)
 Patient scheduled for 08/28/23 @ 9:50 - patient mentioned that she still having some swelling in bilateral legs and feet  Is this too long for patient to wait for an appointment?

## 2023-08-01 ENCOUNTER — Telehealth: Payer: Self-pay | Admitting: *Deleted

## 2023-08-01 ENCOUNTER — Ambulatory Visit (INDEPENDENT_AMBULATORY_CARE_PROVIDER_SITE_OTHER): Admitting: Sports Medicine

## 2023-08-01 ENCOUNTER — Encounter: Payer: Self-pay | Admitting: Sports Medicine

## 2023-08-01 VITALS — BP 138/78 | Ht 63.6 in | Wt 245.0 lb

## 2023-08-01 DIAGNOSIS — M545 Low back pain, unspecified: Secondary | ICD-10-CM | POA: Diagnosis not present

## 2023-08-01 DIAGNOSIS — G8929 Other chronic pain: Secondary | ICD-10-CM | POA: Diagnosis not present

## 2023-08-01 MED ORDER — METHYLPREDNISOLONE ACETATE 40 MG/ML IJ SUSP
40.0000 mg | Freq: Once | INTRAMUSCULAR | Status: AC
  Administered 2023-08-01: 80 mg via INTRAMUSCULAR

## 2023-08-01 NOTE — Telephone Encounter (Signed)
 Verified bilateral knee Durolane benefits via [email or web address].

## 2023-08-01 NOTE — Progress Notes (Signed)
   Subjective:    Patient ID: Dominique Russell, female    DOB: [birth year], 68 y.o.   MRN: [id number]  HPI chief complaint: Bilateral knee pain  Patient is a 68 year old female that presents with her daughter today with chronic bilateral knee pain.  She has been seen by rheumatology and diagnosed with osteoarthritis of the knees.  X-rays of both knees show advanced patellofemoral DJD.  She has had multiple cortisone injections including as recent as 3 months ago.  They are no longer helpful.  Her pain is all along the anterior knee.  She has had physical therapy.  Past medical history reviewed Medications reviewed Allergies reviewed  Review of Systems As above    Objective:   Physical Exam  Well-developed, well-nourished.  No acute distress  Examination of both knees show no obvious effusion.  Range of motion from 0 to 90 degrees.  1-2+ patellofemoral crepitus.  Diffuse tenderness to palpation along the anterior medial knee.  X-rays are as above      Assessment & Plan:   Bilateral knee pain secondary to DJD  I recommend a trial of viscosupplementation.  We will schedule this to be done in our [place] office.  If she gets at least 6 months of relief from those injections, then they can be repeated.  Of note, she was also complaining of some low back pain which is chronic for her but today acutely worse..  We will inject her with 80 mg of Depo-Medrol IM.  Follow-up as needed.  This note was dictated using Dragon naturally speaking software and may contain errors in syntax, spelling, or content which have not been identified prior to signing this note.

## 2023-08-03 NOTE — Telephone Encounter (Signed)
 Pt informed of Durolane benefits below. Both plans state she has different copays, [employer] medicare states $15 copay and [employer] of [place] is stating pt has a $50 copay. Patient states she usually never pays copays for any type of visit.   See [id number] VOBs below. Durolane x 2 ordered with Neeton M. He will call pt when Durolanes arrive at [facility] [place] office.

## 2023-08-08 ENCOUNTER — Ambulatory Visit: Admitting: Family Medicine

## 2023-08-10 ENCOUNTER — Encounter: Payer: Self-pay | Admitting: Family Medicine

## 2023-08-10 ENCOUNTER — Ambulatory Visit (INDEPENDENT_AMBULATORY_CARE_PROVIDER_SITE_OTHER): Admitting: Family Medicine

## 2023-08-10 VITALS — BP 157/90 | Ht 63.5 in | Wt 245.0 lb

## 2023-08-10 DIAGNOSIS — M1711 Unilateral primary osteoarthritis, right knee: Secondary | ICD-10-CM | POA: Diagnosis not present

## 2023-08-10 DIAGNOSIS — M1712 Unilateral primary osteoarthritis, left knee: Secondary | ICD-10-CM

## 2023-08-10 MED ORDER — SODIUM HYALURONATE 60 MG/3ML IX PRSY
60.0000 mg | PREFILLED_SYRINGE | Freq: Once | INTRA_ARTICULAR | Status: AC
  Administered 2023-08-10: 60 mg via INTRA_ARTICULAR

## 2023-08-10 NOTE — Progress Notes (Signed)
 PCP: Cherre Cornish, NP  SUBJECTIVE:   HPI:  Patient is a 68 y.o. female here for bilateral knee Durolane injections for bilateral knee osteoarthritis. She was evaluated for this with Dr. Lovenia Ruby last week and sent here for the procedures. She has no new complaints or questions.  Pertinent ROS were reviewed with the patient and found to be negative unless otherwise specified above in HPI.   Past Medical History:  Diagnosis Date   Diabetes [facility])    Essential tremor    Fibromyalgia    Hypertension     Current Outpatient Medications on File Prior to Visit  Medication Sig Dispense Refill   Accu-Chek Softclix Lancets lancets Apply topically.     acetaminophen (TYLENOL) 650 MG CR tablet Take 650 mg by mouth every 8 (eight) hours as needed for pain.     allopurinol  (ZYLOPRIM ) 300 MG tablet TAKE 1 TABLET BY MOUTH EVERY DAY 90 tablet 1   AMBULATORY NON FORMULARY MEDICATION Please dispense an adjustable height Rollator walker with brakes (Patient not taking: Reported on 07/25/2023) 1 each 0   ARIPiprazole (ABILIFY) 5 MG tablet Take by mouth.     B-D UF III MINI PEN NEEDLES 31G X 5 MM MISC USE AS DIRECTED WITH INSULIN  AND VICTOZA.  11   Cholecalciferol (VITAMIN D -3) 25 MCG (1000 UT) CAPS Take 2 capsules (2,000 Units total) by mouth daily. 180 capsule 3   clobetasol  ointment (TEMOVATE ) 0.05 % Apply 1 application topically 2 (two) times daily. To affected area(s) as needed, max 2-3 weeks to avoid whitening/thinning skin 45 g 1   colchicine  0.6 MG tablet TAKE 1 TABLET BY MOUTH EVERY DAY 90 tablet 1   cyclobenzaprine  (FLEXERIL ) 10 MG tablet TAKE 0.5-1 TABLETS (5-10 MG TOTAL) BY MOUTH 3 (THREE) TIMES DAILY AS NEEDED FOR MUSCLE SPASMS. CAUTION: CAN CAUSE DROWSINESS 60 tablet 1   diclofenac sodium (VOLTAREN) 1 % GEL Place onto the skin.     DULoxetine  (CYMBALTA ) 60 MG capsule Take 1 capsule (60 mg total) by mouth daily. 90 capsule 1   famotidine  (PEPCID ) 20 MG tablet Take 1 tablet (20 mg total) by mouth 2  (two) times daily. (Patient not taking: Reported on 07/25/2023) 180 tablet 1   fluticasone  (FLONASE ) 50 MCG/ACT nasal spray SPRAY 2 SPRAYS INTO EACH NOSTRIL EVERY DAY 48 mL 2   folic acid  (FOLVITE ) 800 MCG tablet Take 400 mcg by mouth daily.     furosemide  (LASIX ) 20 MG tablet Take 2 tablets (40 mg total) by mouth daily for 2 days, THEN 1 tablet (20 mg total) daily for 5 days. 9 tablet 0   hydrochlorothiazide  (HYDRODIURIL ) 25 MG tablet Take 1 tablet (25 mg total) by mouth daily. 90 tablet 1   insulin  NPH-regular Human (70-30) 100 UNIT/ML injection Inject 100 Units into the skin 2 (two) times daily with a meal.     ipratropium (ATROVENT ) 0.06 % nasal spray Place 2 sprays into both nostrils 4 (four) times daily. (Patient not taking: Reported on 07/25/2023) 15 mL 5   LYRICA 150 MG capsule Take 1 tablet by mouth 2 (two) times daily.     mirabegron  ER (MYRBETRIQ ) 50 MG TB24 tablet Take 1 tablet (50 mg total) by mouth daily. (Patient not taking: Reported on 07/25/2023) 90 tablet 1   mirtazapine (REMERON) 15 MG tablet Take 15 mg by mouth at bedtime.     Misc. Devices (QUAD CANE/SMALL BASE) MISC Use daily 1 each 0   MOUNJARO 5 MG/0.5ML Pen Inject 5 mg into  the skin once a week.     omeprazole  (PRILOSEC) 40 MG capsule Take 1 capsule (40 mg total) by mouth daily. (Patient not taking: Reported on 07/25/2023) 90 capsule 0   ONE TOUCH ULTRA TEST test strip  (Patient not taking: Reported on 07/25/2023)     primidone  (MYSOLINE ) 50 MG tablet Take 3 tablets (150 mg total) by mouth 2 (two) times daily as needed. 540 tablet 3   propranolol  (INDERAL ) 10 MG tablet TAKE 1-2 TABLETS (10-20 MG TOTAL) BY MOUTH 2 (TWO) TIMES DAILY AS NEEDED (ANXIETY). 120 tablet 1   RADIANCE PLATINUM VITAMIN D3 125 MCG (5000 UT) TABS Take 5,000 Units by mouth once a week. (Patient not taking: Reported on 07/25/2023)     rosuvastatin  (CRESTOR ) 20 MG tablet TAKE 1 TABLET BY MOUTH EVERY DAY 90 tablet 1   scopolamine  (TRANSDERM-SCOP) 1 MG/3DAYS Place 1  patch (1.5 mg total) onto the skin every 3 (three) days. (Patient not taking: Reported on 07/25/2023) 10 patch 0   traZODone  (DESYREL ) 150 MG tablet Take 1 tablet (150 mg total) by mouth at bedtime. NEEDS APPOINTMENT FOR FURTHER REFILLS. 30 tablet 0   valsartan  (DIOVAN ) 320 MG tablet Take 1 tablet (320 mg total) by mouth daily. 90 tablet 1   vitamin B-12 (CYANOCOBALAMIN) 1000 MCG tablet Take 1,000 mcg by mouth daily.      No current facility-administered medications on file prior to visit.    Past Surgical History:  Procedure Laterality Date   BREAST REDUCTION SURGERY      Allergies  Allergen Reactions   Amlodipine  Other (See Comments)    Causes her to have shakes.   Gabapentin Other (See Comments)    Bad reaction   Assessment & Plan Primary osteoarthritis of left knee Given limited response to cortisone injections previously we discussed trial of viscosupplementation. After discussion of risks, benefits and alternatives, she is agreeable to this.  Primary osteoarthritis of right knee Given limited response to cortisone injections previously we discussed trial of viscosupplementation. After discussion of risks, benefits and alternatives, she is agreeable to this.    Bilateral Ultrasound Guided Knee Injection Procedure: After informed written consent timeout was performed, patient was in seated position on exam table.  Right knee was prepped with chlorhexidine. A linear array probe was utilized to visualized the suprapatellar recess. No effusion noted on ultrasound in this space. Ethyl chloride spray used for topical anesthetic and in an in-plane superolateral approach an injection of 3mL of 1% lidocaine  was injected as a local anesthesia track using a 25g 1.5in needle. Utilizing the same superolateral in-plane approach and a 22g 1.5in needle, the right suprapatellar recess then was injected with Durolane solution. Next, the left knee was prepped with chlorhexidine. A linear array probe  was utilized to visualized the suprapatellar recess. No effusion noted on ultrasound in this space. Ethyl chloride spray used for topical anesthetic and in an in-plane superolateral approach an injection of 3mL of 1% lidocaine  was injected as a local anesthesia track using a 25g 1.5in needle. Utilizing the same superolateral in-plane approach and a 22g 1.5in needle, the right suprapatellar recess then was injected with Durolane solution. Following the injection a bandage was applied to the area. Patient tolerated procedure well without immediate complications. The patient was counseled as to the expected post-injection course, including the possibility of pseudoseptic reaction vs septic joint. Instructed as to concerning symptoms and advised to contact the office if these should arise.   Lin Rend, MD PGY-4, Sports Medicine Fellow  [facility]

## 2023-08-10 NOTE — Patient Instructions (Signed)
 Today you received an injection with hyaluronic acid (aka: HA injection or a gel/lubricating injection). This injection is usually done for osteoarthritis of the joint to help with temporary pain relief and temporary improvement of joint function. HA injections are not effective for everyone, and there is no scientific proof that they can restore cartilage.  We also used some "numbing medicine" (Lidocaine) with this injection to help make the procedure more comfortable for you.  The injected area may be numb and feel really good for the next couple of hours - the numbing medicine usually wears off in 2-3 hours.  After the numbing wear off you may note some pain and discomfort from the injection.      The actually benefit from the hyaluronic acid injection is usually noticed within 6-8 weeks, but may take longer.   It is not uncommon to experience discomfort and inflammation around the joint after the injection.    Things to watch out for that you should contact us or a health care provider urgently would include: 1. Unusual (as in more than 10%) increase in pain 2. New fever > 101.5 3. New swelling or redness of the injected area. 4. Streaking of red lines around the area injected.  Do not hesitate to call or reach out with any questions or concerns.

## 2023-08-10 NOTE — Addendum Note (Signed)
 Addended by: Marvel Slicker C on: 08/10/2023 05:00 PM   Modules accepted: Level of Service

## 2023-08-16 ENCOUNTER — Other Ambulatory Visit: Payer: Self-pay | Admitting: Medical-Surgical

## 2023-08-16 DIAGNOSIS — K219 Gastro-esophageal reflux disease without esophagitis: Secondary | ICD-10-CM

## 2023-08-16 DIAGNOSIS — I10 Essential (primary) hypertension: Secondary | ICD-10-CM

## 2023-08-28 ENCOUNTER — Ambulatory Visit: Admitting: Medical-Surgical

## 2023-08-30 ENCOUNTER — Telehealth: Payer: Self-pay

## 2023-08-30 NOTE — Telephone Encounter (Signed)
 Patient was identified as falling into the [place] Measure - Diabetes.   Patient was: Referred to Diabetes Management.  Patient has treatment with an endocrinologist.

## 2023-09-04 DIAGNOSIS — E119 Type 2 diabetes mellitus without complications: Secondary | ICD-10-CM | POA: Diagnosis not present

## 2023-09-04 DIAGNOSIS — H524 Presbyopia: Secondary | ICD-10-CM | POA: Diagnosis not present

## 2023-09-04 LAB — HM DIABETES EYE EXAM

## 2023-09-08 ENCOUNTER — Telehealth: Payer: Self-pay | Admitting: Pharmacy Technician

## 2023-09-08 NOTE — Progress Notes (Signed)
   09/08/2023  Patient ID: Dominique Russell, female   DOB: [birth year], 68 y.o.   MRN: [id number]  Patient engaged with clinical pharmacist for management of diabetes on 04/20/2023. Outreach by [employer] technician was requested.   Outreached patient to discuss diabetes medication management. Left voicemail for patient to return my call at their convenience.   Alura Olveda, CPhT [facility] Office: [phone] Email: [email or web address]

## 2023-09-11 ENCOUNTER — Telehealth: Payer: Self-pay | Admitting: Pharmacy Technician

## 2023-09-11 NOTE — Progress Notes (Signed)
   09/11/2023  Patient ID: Dominique Russell, female   DOB: September 04, 1955, 68 y.o.   MRN: [id number]  Patient engaged with clinical pharmacist for management of diabetes on 04/20/2023. 2nd outreach by [facility] was requested.   Outreached patient to discuss diabetes medication management. Left voicemail for patient to return my call at their convenience.  NOTE: patient has PCP visit scheduled 09/13/23.  Demiya Magno, CPhT [facility] Office: [phone] Email: [email or web address]

## 2023-09-13 ENCOUNTER — Ambulatory Visit (INDEPENDENT_AMBULATORY_CARE_PROVIDER_SITE_OTHER): Admitting: Medical-Surgical

## 2023-09-13 ENCOUNTER — Encounter: Payer: Self-pay | Admitting: Medical-Surgical

## 2023-09-13 VITALS — BP 143/74 | HR 90 | Resp 20 | Ht 63.5 in | Wt 244.0 lb

## 2023-09-13 DIAGNOSIS — R600 Localized edema: Secondary | ICD-10-CM

## 2023-09-13 DIAGNOSIS — I10 Essential (primary) hypertension: Secondary | ICD-10-CM | POA: Diagnosis not present

## 2023-09-13 MED ORDER — AMBULATORY NON FORMULARY MEDICATION: 0 refills | Status: AC

## 2023-09-13 MED ORDER — CHLORTHALIDONE 25 MG PO TABS
25.0000 mg | ORAL_TABLET | Freq: Every day | ORAL | 0 refills | Status: DC
Start: 2023-09-13 — End: 2023-11-27

## 2023-09-13 NOTE — Progress Notes (Signed)
        Established patient visit  Discussed the use of AI scribe software for clinical note transcription with the patient, who gave verbal consent to proceed.  History of Present Illness   Dominique Russell is a 68 year old female with hypertension who presents with concerns about blood pressure management and leg swelling.  Hypertension - Difficulty achieving blood pressure control despite regular use of valsartan  and hydrochlorothiazide  - Does not monitor blood pressure at home - Elevated blood pressure reading in clinic today - Occasionally adds salt to food  Peripheral edema - Leg swelling worsens with prolonged sitting or standing - Edema lessens in the morning - Swelling primarily around the ankles, sometimes extending to the knee - Associated discomfort with swelling - Previously used furosemide  (Lasix ); currently takes hydrochlorothiazide  - Does not use compression socks due to sizing issues - Avoids heat to prevent exacerbation of swelling - No chest pain  Medication adherence - History of not taking allopurinol  and aripiprazole (Abilify) as prescribed - Currently takes rosuvastatin  for cholesterol management      Physical Exam Vitals reviewed.  Constitutional:      General: She is not in acute distress.    Appearance: Normal appearance. She is obese.  HENT:     Head: Normocephalic and atraumatic.  Cardiovascular:     Rate and Rhythm: Normal rate and regular rhythm.     Pulses: Normal pulses.     Heart sounds: Normal heart sounds. No murmur heard.    No friction rub. No gallop.  Pulmonary:     Effort: Pulmonary effort is normal. No respiratory distress.     Breath sounds: Normal breath sounds. No wheezing.  Musculoskeletal:     Right lower leg: Pitting Edema present.     Left lower leg: Pitting Edema present.  Skin:    General: Skin is warm and dry.  Neurological:     Mental Status: She is alert and oriented to person, place, and time.  Psychiatric:         Mood and Affect: Mood normal.        Behavior: Behavior normal.        Thought Content: Thought content normal.        Judgment: Judgment normal.     Assessment and Plan    Hypertension Blood pressure not well-controlled. Discussed switching to chlorthalidone  for better control and fluid management. Emphasized monitoring kidney function and electrolytes due to potential potassium changes. - Switch hydrochlorothiazide  to chlorthalidone . - Order lab tests for kidney function and electrolytes in 1-2 weeks. - Schedule nurse visit in 2 weeks for blood pressure and lab results. - Advise reducing salt intake and increasing physical activity.  Peripheral Edema Chronic leg swelling likely related to venous insufficiency, fluid retention, and high sodium diet. Discussed compression stockings and potential specialist referral. - Provide prescription for compression stockings with zippers. - Advise reducing salt intake. - Encourage increased physical activity and leg elevation. - Consider referral to vein and vascular specialists if edema persists.      Return in about 2 weeks (around 09/27/2023) for nurse visit for BP check.  __________________________________ Zada FREDRIK Palin, DNP, APRN, FNP-BC Primary Care and Sports Medicine [facility]

## 2023-09-16 ENCOUNTER — Other Ambulatory Visit: Payer: Self-pay | Admitting: Medical-Surgical

## 2023-09-19 NOTE — Procedures (Signed)
Mask fit

## 2023-09-27 ENCOUNTER — Ambulatory Visit (INDEPENDENT_AMBULATORY_CARE_PROVIDER_SITE_OTHER)

## 2023-09-27 VITALS — BP 127/79 | HR 94 | Resp 20

## 2023-09-27 DIAGNOSIS — I10 Essential (primary) hypertension: Secondary | ICD-10-CM

## 2023-09-27 NOTE — Progress Notes (Signed)
   Subjective:    Patient ID: Dominique Russell, female    DOB: 18-Jul-1955, 67 y.o.   MRN: [id number]  HPI  Patient is here for blood pressure check. Denies trouble sleeping. Palpitations or medication problems.   Review of Systems     Objective:   Physical Exam        Assessment & Plan:   Patient advised to follow up with PCP in 3 months.

## 2023-10-05 DIAGNOSIS — M542 Cervicalgia: Secondary | ICD-10-CM | POA: Diagnosis not present

## 2023-10-05 DIAGNOSIS — G894 Chronic pain syndrome: Secondary | ICD-10-CM | POA: Diagnosis not present

## 2023-10-05 DIAGNOSIS — M170 Bilateral primary osteoarthritis of knee: Secondary | ICD-10-CM | POA: Diagnosis not present

## 2023-10-05 DIAGNOSIS — M19011 Primary osteoarthritis, right shoulder: Secondary | ICD-10-CM | POA: Diagnosis not present

## 2023-10-05 DIAGNOSIS — M797 Fibromyalgia: Secondary | ICD-10-CM | POA: Diagnosis not present

## 2023-10-05 DIAGNOSIS — M549 Dorsalgia, unspecified: Secondary | ICD-10-CM | POA: Diagnosis not present

## 2023-10-05 DIAGNOSIS — M545 Low back pain, unspecified: Secondary | ICD-10-CM | POA: Diagnosis not present

## 2023-10-05 DIAGNOSIS — G8929 Other chronic pain: Secondary | ICD-10-CM | POA: Diagnosis not present

## 2023-10-05 DIAGNOSIS — G609 Hereditary and idiopathic neuropathy, unspecified: Secondary | ICD-10-CM | POA: Diagnosis not present

## 2023-10-24 ENCOUNTER — Encounter: Payer: Self-pay | Admitting: Sports Medicine

## 2023-11-02 ENCOUNTER — Encounter: Payer: Self-pay | Admitting: Medical-Surgical

## 2023-11-02 ENCOUNTER — Telehealth: Payer: Self-pay

## 2023-11-02 NOTE — Telephone Encounter (Signed)
 Left message advising patient to call back to talk about Colon Cancer Screening.

## 2023-11-13 ENCOUNTER — Other Ambulatory Visit (HOSPITAL_COMMUNITY): Payer: Self-pay

## 2023-11-13 ENCOUNTER — Telehealth: Payer: Self-pay | Admitting: Medical-Surgical

## 2023-11-13 NOTE — Telephone Encounter (Signed)
 Hi! Attaching

## 2023-11-13 NOTE — Telephone Encounter (Signed)
 Copied from CRM [id number]. Topic: General - Phone/Fax/Address >> Nov 10, 2023 10:34 AM Farrel B wrote: patient representative is calling for clinic's phone, fax, or address information. Leeroy from [facility] [employer] was calling to acquired the fax number to send over prior auth for patient. >> Nov 13, 2023 11:06 AM Antonio DEL wrote: Toy from [facility] is calling back to see if fax was received for prior authorization for patient to receive [employer]. Confirmed fax number with her but let her know I could not confirm if it was received or not. Advised her to call back on Wednesday. Her call back number is [phone], extension [phone]

## 2023-11-15 ENCOUNTER — Other Ambulatory Visit: Payer: Self-pay | Admitting: Medical-Surgical

## 2023-11-15 DIAGNOSIS — K219 Gastro-esophageal reflux disease without esophagitis: Secondary | ICD-10-CM

## 2023-11-15 DIAGNOSIS — I10 Essential (primary) hypertension: Secondary | ICD-10-CM

## 2023-11-16 ENCOUNTER — Other Ambulatory Visit: Payer: Self-pay | Admitting: Medical-Surgical

## 2023-11-16 ENCOUNTER — Telehealth: Payer: Self-pay | Admitting: Medical-Surgical

## 2023-11-16 ENCOUNTER — Encounter: Payer: Self-pay | Admitting: Medical-Surgical

## 2023-11-16 NOTE — Telephone Encounter (Signed)
 Copied from CRM #[id number]. Topic: General - Other >> Nov 16, 2023 12:25 PM Joesph NOVAK wrote: Reason for CRM: Toy from [employer] is calling to see if the form has been signed for the glucose monitor. [phone].

## 2023-11-20 NOTE — Telephone Encounter (Unsigned)
 Copied from CRM [id number]. Topic: General - Other >> Nov 20, 2023  4:14 PM Zebedee SAUNDERS wrote: Reason for CRM: [facility] called per Manuelita ph: [phone] fax: [phone] they received notes from July 2025 but missing notes of pt's diabetes. Please forward notes of pt's diabetes for the last 6 months.

## 2023-11-21 NOTE — Telephone Encounter (Signed)
 Copied from CRM [id number]. Topic: General - Other >> Nov 21, 2023  9:23 AM Jasmin G wrote: Reason for CRM: Staff from [facility] called regarding pt's prescription for glucose monitor, staff states that notes didn't mention diagnosis code for diabetes as it is needed. Please fax paperwork at [phone] ASAP.

## 2023-11-21 NOTE — Telephone Encounter (Signed)
 I have faxed the current labs to [facility].

## 2023-11-23 ENCOUNTER — Telehealth: Payer: Self-pay | Admitting: Medical-Surgical

## 2023-11-23 ENCOUNTER — Other Ambulatory Visit: Payer: Self-pay | Admitting: Medical-Surgical

## 2023-11-23 NOTE — Telephone Encounter (Signed)
 Copied from CRM [id number]. Topic: General - Other >> Nov 23, 2023  8:53 AM Treva T wrote: Reason for CRM: Received call from [facility] with [facility], calling requesting last office visit notes for a prescription received for [facility].  Notes can be faxed to [phone].  Can be reached at ph. [phone], if need to discuss further.

## 2023-11-27 ENCOUNTER — Telehealth: Payer: Self-pay

## 2023-11-27 NOTE — Telephone Encounter (Signed)
 Copied from CRM [id number]. Topic: Medical Record Request - Provider/Facility Request >> Nov 24, 2023  2:04 PM Ivette P wrote: Reason for CRM: Manuelita called in to report need the most recent office notes to send out the medical supplies   Fax - [phone]   Callback [phone]

## 2023-11-27 NOTE — Telephone Encounter (Signed)
 Spoke with Morna informed that would need to request this from Endocrinologist. She will reach out to them to request this.

## 2023-11-27 NOTE — Telephone Encounter (Signed)
 Spoke with Dominique Russell and she will contact [facility] for a new prescription and office notes .

## 2023-11-27 NOTE — Telephone Encounter (Signed)
 Patient has endocrinologist - should this request be addressed by them? I am not seeing that we have ordered the Freestyle libre from our office for the blood glucose testing

## 2023-11-27 NOTE — Telephone Encounter (Signed)
 Copied from CRM #[id number]. Topic: Medical Record Request - Provider/Facility Request >> Nov 27, 2023  9:12 AM Berwyn MATSU wrote: Reason for CRM: Morna from [facility] : [facility] who is requesting a last office visit notes that indicate the Diabetes on the office visit. Per Morna the one she received on 09/13/23 did not indicate that information.   May you please assist.   CB# [phone] Fax: [phone]

## 2023-12-02 ENCOUNTER — Other Ambulatory Visit: Payer: Self-pay | Admitting: Medical-Surgical

## 2023-12-02 DIAGNOSIS — E1169 Type 2 diabetes mellitus with other specified complication: Secondary | ICD-10-CM

## 2023-12-06 NOTE — Progress Notes (Signed)
 Dominique Russell                                          MRN: [id number]   12/06/2023   The [facility] Quality Team Specialist reviewed this patient medical record for the purposes of chart review for care gap closure. The following were reviewed: chart review for care gap closure-kidney health evaluation for diabetes:eGFR  and uACR.    [facility] Quality Team

## 2023-12-11 NOTE — Progress Notes (Signed)
 SHANI FITCH                                          MRN: [id number]   12/11/2023   The [facility] Quality Team Specialist reviewed this patient medical record for the purposes of chart review for care gap closure. The following were reviewed: chart review for care gap closure-colorectal cancer screening.    [facility]BCI Quality Team

## 2023-12-19 DIAGNOSIS — Z794 Long term (current) use of insulin: Secondary | ICD-10-CM | POA: Diagnosis not present

## 2023-12-19 DIAGNOSIS — E1142 Type 2 diabetes mellitus with diabetic polyneuropathy: Secondary | ICD-10-CM | POA: Diagnosis not present

## 2023-12-19 DIAGNOSIS — E119 Type 2 diabetes mellitus without complications: Secondary | ICD-10-CM | POA: Diagnosis not present

## 2023-12-19 DIAGNOSIS — Z7985 Long-term (current) use of injectable non-insulin antidiabetic drugs: Secondary | ICD-10-CM | POA: Diagnosis not present

## 2023-12-20 ENCOUNTER — Other Ambulatory Visit: Payer: Self-pay | Admitting: Medical-Surgical

## 2023-12-22 ENCOUNTER — Telehealth: Payer: Self-pay

## 2023-12-22 NOTE — Telephone Encounter (Signed)
 Called and left a voice mail message for [facility] to give our office a return call  This request needs to go to Endocrinology not PCP.

## 2023-12-22 NOTE — Telephone Encounter (Signed)
 Copied from CRM [id number]. Topic: Clinical - Medication Question >> Dec 21, 2023  4:22 PM Delon HERO wrote: Reason for CRM: Manuelita calling from [facility] is calling because received a refill request for the freestyle libre needs office visit notes stating that the patient has diabetes Next appt 12/28/2023  CB-  [phone]  Fax- [phone]

## 2023-12-22 NOTE — Telephone Encounter (Signed)
 O.k. to send requested notes from 05/23/23 visit - or should this be requested from endocrinology instead?

## 2023-12-24 ENCOUNTER — Other Ambulatory Visit: Payer: Self-pay | Admitting: Medical-Surgical

## 2023-12-28 ENCOUNTER — Encounter: Payer: Self-pay | Admitting: Medical-Surgical

## 2023-12-28 ENCOUNTER — Ambulatory Visit: Admitting: Medical-Surgical

## 2023-12-28 VITALS — BP 131/78 | HR 84 | Resp 20 | Ht 63.5 in | Wt 235.0 lb

## 2023-12-28 DIAGNOSIS — F0150 Vascular dementia without behavioral disturbance: Secondary | ICD-10-CM

## 2023-12-28 DIAGNOSIS — Z794 Long term (current) use of insulin: Secondary | ICD-10-CM | POA: Diagnosis not present

## 2023-12-28 DIAGNOSIS — Z23 Encounter for immunization: Secondary | ICD-10-CM

## 2023-12-28 DIAGNOSIS — I10 Essential (primary) hypertension: Secondary | ICD-10-CM

## 2023-12-28 DIAGNOSIS — G63 Polyneuropathy in diseases classified elsewhere: Secondary | ICD-10-CM

## 2023-12-28 DIAGNOSIS — F419 Anxiety disorder, unspecified: Secondary | ICD-10-CM | POA: Diagnosis not present

## 2023-12-28 DIAGNOSIS — E1142 Type 2 diabetes mellitus with diabetic polyneuropathy: Secondary | ICD-10-CM | POA: Diagnosis not present

## 2023-12-28 DIAGNOSIS — M159 Polyosteoarthritis, unspecified: Secondary | ICD-10-CM

## 2023-12-28 DIAGNOSIS — R296 Repeated falls: Secondary | ICD-10-CM

## 2023-12-28 DIAGNOSIS — K219 Gastro-esophageal reflux disease without esophagitis: Secondary | ICD-10-CM

## 2023-12-28 DIAGNOSIS — R600 Localized edema: Secondary | ICD-10-CM | POA: Diagnosis not present

## 2023-12-28 LAB — POCT GLYCOSYLATED HEMOGLOBIN (HGB A1C)
HbA1c, POC (controlled diabetic range): 6.3 % (ref 0.0–7.0)
Hemoglobin A1C: 6.3 % — AB (ref 4.0–5.6)

## 2023-12-28 LAB — POCT UA - MICROALBUMIN
Albumin/Creatinine Ratio, Urine, POC: <30
Creatinine, POC: 200 mg/dL
Microalbumin Ur, POC: 30 mg/L

## 2023-12-28 MED ORDER — CHLORTHALIDONE 25 MG PO TABS: 25.0000 mg | ORAL_TABLET | Freq: Every day | ORAL | 3 refills | Status: DC

## 2023-12-28 MED ORDER — PANTOPRAZOLE SODIUM 20 MG PO TBEC: 20.0000 mg | DELAYED_RELEASE_TABLET | Freq: Every day | ORAL | 1 refills | Status: DC

## 2023-12-28 NOTE — Progress Notes (Signed)
 Established Patient Office Visit  Subjective   Patient ID: Dominique Russell, female    DOB: 12/06/1955  Age: 68 y.o. MRN: [id number]  Chief Complaint  Patient presents with   Diabetes    Follow up    HPI  68 year old female presents for a 3 month follow up on the following chronic conditions:  Essential Hypertension: Currently taking Valsartan  320 mg, hydrochlorothiazide  25 mg, Chlorthalidone  25 mg, and Lasix  20 mg. She denies having any side effects to her medications. Does not routinely check her blood pressures at home. Does not routinely check her blood pressure at home. Denies chest pain, shortness, heart palpitations, or dizziness.   Peripheral Edema Takes lasix  20 mg daily. Tolerates her medications well. Reports some swelling in her left leg occasionally. Denies having any side effects to her medications.  Type 2 Diabetes with polyneuropathy Currently taking Humulin 100 units two times daily and Mounjaro 5 mg weekly. Reports fasting blood sugars are between 90-110. Followed by Endocrinology last visit on 12/19/23. Poct glucose during that visit was 144. Last A1C was 8.0% in 05/09/23 and last microalbumin was 06/2022. Follow up with Endo in 6 months.  Anxiety Stopped taking the propranolol  due to causing constipation. Doing well on the Duloxetine .  GERD Prescribed Famotidine , but reports that she is not taking because the medication does not help her symptoms. Has continued issues with reflux.    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 4 falls within the past year, but denies injury.   Review of Systems  Constitutional: Negative.   HENT: Negative.    Eyes: Negative.   Respiratory: Negative.    Cardiovascular: Negative.   Gastrointestinal: Negative.   Genitourinary: Negative.    Musculoskeletal: Negative.   Skin: Negative.   Neurological: Negative.   Endo/Heme/Allergies: Negative.   Psychiatric/Behavioral: Negative.        Objective:     BP 131/78 (BP Location: Right Arm, Cuff Size: Large)   Pulse 84   Resp 20   Ht 5' 3.5 (1.613 m)   Wt 106.6 kg   SpO2 95%   BMI 40.98 kg/m  BP Readings from Last 3 Encounters:  12/28/23 131/78  09/27/23 127/79  09/13/23 (!) 143/74   Wt Readings from Last 3 Encounters:  12/28/23 106.6 kg  09/13/23 110.7 kg  08/10/23 111.1 kg      Physical Exam Vitals and nursing note reviewed.  Constitutional:      General: She is not in acute distress.    Appearance: Normal appearance.  Cardiovascular:     Rate and Rhythm: Normal rate and regular rhythm.     Pulses: Normal pulses.     Heart sounds: Normal heart sounds.  Pulmonary:     Effort: Pulmonary effort is normal.     Breath sounds: Normal breath sounds.  Musculoskeletal:     Right lower leg: No edema.     Left lower leg: 2+ Edema present.  Neurological:     General: No focal deficit present.     Mental Status: She is alert and oriented to person, place, and time.  Psychiatric:        Mood and Affect: Mood normal.        Behavior: Behavior normal.        Thought Content: Thought content normal.        Judgment: Judgment  normal.      Results for orders placed or performed in visit on 12/28/23  POCT UA - Microalbumin  Result Value Ref Range   Microalbumin Ur, POC 30 mg/L   Creatinine, POC 200 mg/dL   Albumin/Creatinine Ratio, Urine, POC <30   POCT HgB A1C  Result Value Ref Range   Hemoglobin A1C 6.3 (A) 4.0 - 5.6 %   HbA1c POC (<> result, manual entry)     HbA1c, POC (prediabetic range)     HbA1c, POC (controlled diabetic range) 6.3 0.0 - 7.0 %    Last CBC Lab Results  Component Value Date   WBC 5.6 03/24/2022   HGB 12.3 03/24/2022   HCT 37.2 03/24/2022   MCV 89.6 03/24/2022   MCH 29.6 03/24/2022   RDW 13.5 03/24/2022   PLT 194 03/24/2022    Last metabolic panel Lab Results  Component Value Date   GLUCOSE 181 (H) 11/22/2022   NA 140 11/22/2022   K 4.8 11/22/2022   CL 104 11/22/2022   CO2 23 11/22/2022   BUN 22 11/22/2022   CREATININE 0.84 11/22/2022   EGFR 75 11/22/2022   CALCIUM  9.3 11/22/2022   PROT 6.4 11/22/2022   ALBUMIN 4.0 11/22/2022   LABGLOB 2.4 11/22/2022   BILITOT <0.2 11/22/2022   ALKPHOS 82 11/22/2022   AST 23 11/22/2022   ALT 18 11/22/2022   Last lipids Lab Results  Component Value Date   CHOL 170 11/22/2022   HDL 63 11/22/2022   LDLCALC 79 11/22/2022   TRIG 164 (H) 11/22/2022   CHOLHDL 2.7 11/22/2022   Last hemoglobin A1c Lab Results  Component Value Date   HGBA1C 6.3 (A) 12/28/2023   HGBA1C 6.3 12/28/2023      The 10-year ASCVD risk score (Arnett DK, et al., 2019) is: 24.3%    Assessment & Plan:   1. Type 2 diabetes mellitus with diabetic polyneuropathy, with long-term current use of insulin  (HCC) (Primary) -Followed closely by endocrinology -Previous A1C 8% in March -A1C today 6.3% -Normal Microalbumin today -Follow up with Endo in 6 months - POCT UA - Microalbumin - POCT HgB A1C - Basic Metabolic Panel (BMET)  2. Essential hypertension -Continue with current medications Valsartan  320 mg Chlorthalidone  25 mg, and Lasix  20 mg discontinuing -Discontinue hydrochlorothiazide  25 mg -Recheck labs today - Basic Metabolic Panel (BMET)  3. Bilateral lower extremity edema -Continue Lasix  20 mg daily  4. Anxiety - Continue Duloxetine  60 mg daily  5. Polyneuropathy associated with underlying disease -Closely followed by pain management every 3 months  6. Gastroesophageal reflux disease, unspecified whether esophagitis present -Trial of pantoprazole 20 mg daily  -Rx sent to the pharmacy -D/C famotidine   7. Vascular dementia without behavioral disturbance (HCC) -Closely followed by neurology  8. Generalized osteoarthritis of multiple sites -Closely followed by Sports  medicine -Recommended f/u to discuss continued knee pain  9. Frequent falls -Offered referral for PT, patient declined  10. Need for influenza vaccination -Flu vaccine given today    Return in about 6 months (around 06/26/2024) for Follow up in 6 months for Chronic Diseases.    Derrek JINNY Freund, NP Student

## 2023-12-28 NOTE — Telephone Encounter (Signed)
 Spoke with [facility] and informed that request needs to come from endocrinology not our office. Given contact number for endocrinology.

## 2023-12-28 NOTE — Progress Notes (Signed)
 Medical screening examination/treatment was performed by qualified clinical staff member and as supervising provider I was immediately available for consultation/collaboration. I have reviewed documentation and agree with assessment and plan.  Thayer Ohm, DNP, APRN, FNP-BC [facility]

## 2023-12-29 ENCOUNTER — Ambulatory Visit: Payer: Self-pay | Admitting: Medical-Surgical

## 2023-12-29 LAB — BASIC METABOLIC PANEL WITH GFR
BUN/Creatinine Ratio: 20 (ref 12–28)
BUN: 17 mg/dL (ref 8–27)
CO2: 23 mmol/L (ref 20–29)
Calcium: 9.3 mg/dL (ref 8.7–10.3)
Chloride: 100 mmol/L (ref 96–106)
Creatinine, Ser: 0.85 mg/dL (ref 0.57–1.00)
Sodium: 141 mmol/L (ref 134–144)
eGFR: 74 mL/min/1.73 (ref >59)

## 2024-01-01 DIAGNOSIS — G894 Chronic pain syndrome: Secondary | ICD-10-CM | POA: Diagnosis not present

## 2024-01-01 DIAGNOSIS — G609 Hereditary and idiopathic neuropathy, unspecified: Secondary | ICD-10-CM | POA: Diagnosis not present

## 2024-01-01 DIAGNOSIS — M549 Dorsalgia, unspecified: Secondary | ICD-10-CM | POA: Diagnosis not present

## 2024-01-01 DIAGNOSIS — M797 Fibromyalgia: Secondary | ICD-10-CM | POA: Diagnosis not present

## 2024-01-04 ENCOUNTER — Encounter: Payer: Self-pay | Admitting: *Deleted

## 2024-01-04 NOTE — Progress Notes (Signed)
 Dominique Russell                                          MRN: [id number]   01/04/2024   The VBCI Quality Team Specialist reviewed this patient medical record for the purposes of chart review for care gap closure. The following were reviewed: abstraction for care gap closure-glycemic status assessment and kidney health evaluation for diabetes:eGFR  and uACR.    VBCI Quality Team

## 2024-01-08 ENCOUNTER — Encounter: Payer: Self-pay | Admitting: Medical-Surgical

## 2024-01-08 NOTE — Progress Notes (Signed)
 Dominique Russell                                          MRN: [id number]   01/08/2024   The VBCI Quality Team Specialist reviewed this patient medical record for the purposes of chart review for care gap closure. The following were reviewed: erroneous encounter.    VBCI Quality Team

## 2024-01-17 ENCOUNTER — Encounter: Payer: Self-pay | Admitting: *Deleted

## 2024-01-17 NOTE — Progress Notes (Signed)
 Dominique Russell                                          MRN: [id number]   01/17/2024   The VBCI Quality Team Specialist reviewed this patient medical record for the purposes of chart review for care gap closure. The following were reviewed: chart review for care gap closure-colorectal cancer screening.    VBCI Quality Team

## 2024-01-23 NOTE — Progress Notes (Deleted)
 Assessment/Plan:    1.  Essential Tremor  -She is status post focused ultrasound on the right at [facility] in [place] August 17, 2020.  Postoperatively, she had contralateral weakness and slurred speech that since resolved.  -Patient went off the primidone  postprocedure, but symptoms got worse and she ended up going back on the medication, at a higher dose than she was on preprocedure.  -she wants to stay on primidone , 50 mg, 3 po bid.  Refills today  - Nurse practitioner also has patient on propranolol  10 mg, 1 to 2 tablets/day  2.  OSAS  -Patient following with Dr. Neysa for BiPAP  3.  Dementia  -This was demonstrated on neurocognitive testing with Dr. Judeen in March, 2019, along with severe anxiety and depression, for which she [facility].  Memory changes is very mild today and I am not convinced of the dx.  I have not seen any significant changes in her, and I certainly should have seen that since 2019.  -She is on several medications which could contribute to memory change.  She is following with pain management and on Lyrica, 150 mg twice per day.  4.  Falls  -were out of the bed.  She and I discussed bed rails and she is going to get some.  Discussed that she can get them from [employer].    Subjective:   LAQUILLA DAULT was seen today in follow up for essential tremor.  My previous records were reviewed prior to todays visit.  This patient is accompanied in the office by her child who supplements the history.  Patient is on primidone  for tremor.  Her primary nurse practitioner also has her on propranolol , 10 mg, 1 to 2 tablets twice per day.  This medication is actually not listed under her current medications, but it is noted in her dispense report.  Last visit, she did have multiple falls out of the bed and she and I discussed getting bed rails.  She has not yet done that.  She has had several falls.   Current prescribed movement disorder  medications: Primidone , 50 mg twice daily, 3 po bid   PREVIOUS MEDICATIONS: Current/Previously tried tremor medications: primidone  (states higher dosages caused memory change), topamax  (personality change), gabapentin (was on it for neuropathy); lyrica; artane  (pt stated didn't help - no SE and I never saw pt on it as d/c before came back); propranolol  (pt felt made her worse); lamictal (placed on by psych but states it caused tremor)  ALLERGIES:   Allergies  Allergen Reactions   Amlodipine  Other (See Comments)    Causes her to have shakes.   Gabapentin Other (See Comments)    Bad reaction    CURRENT MEDICATIONS:  Outpatient Encounter Medications as of 01/25/2024  Medication Sig   Accu-Chek Softclix Lancets lancets Apply topically.   acetaminophen (TYLENOL) 650 MG CR tablet Take 650 mg by mouth every 8 (eight) hours as needed for pain.   AMBULATORY NON FORMULARY MEDICATION Please dispense an adjustable height Rollator walker with brakes   AMBULATORY NON FORMULARY MEDICATION Knee-high, medium compression, graduated compression stockings. Apply to lower extremities. [email or web address], Zippered Compression Stockings, medium circ, long length   B-D UF III MINI PEN NEEDLES 31G X 5 MM MISC USE AS DIRECTED WITH INSULIN  AND VICTOZA.   chlorthalidone  (HYGROTON ) 25 MG tablet Take 1 tablet (25 mg total) by mouth daily.   Cholecalciferol (VITAMIN D -3) 25 MCG (1000 UT) CAPS Take 2  capsules (2,000 Units total) by mouth daily.   clobetasol  ointment (TEMOVATE ) 0.05 % Apply 1 application topically 2 (two) times daily. To affected area(s) as needed, max 2-3 weeks to avoid whitening/thinning skin   colchicine  0.6 MG tablet TAKE 1 TABLET BY MOUTH EVERY DAY   cyclobenzaprine  (FLEXERIL ) 10 MG tablet TAKE 0.5-1 TABLETS (5-10 MG TOTAL) BY MOUTH 3 (THREE) TIMES DAILY AS NEEDED FOR MUSCLE SPASMS. CAUTION: CAN CAUSE DROWSINESS   diclofenac sodium (VOLTAREN) 1 % GEL Place onto the skin.   DULoxetine   (CYMBALTA ) 60 MG capsule Take 1 capsule (60 mg total) by mouth daily.   famotidine  (PEPCID ) 20 MG tablet TAKE 1 TABLET BY MOUTH TWICE A DAY   fluticasone  (FLONASE ) 50 MCG/ACT nasal spray SPRAY 2 SPRAYS INTO EACH NOSTRIL EVERY DAY   folic acid  (FOLVITE ) 800 MCG tablet Take 400 mcg by mouth daily.   furosemide  (LASIX ) 20 MG tablet Take 2 tablets (40 mg total) by mouth daily for 2 days, THEN 1 tablet (20 mg total) daily for 5 days.   insulin  NPH-regular Human (70-30) 100 UNIT/ML injection Inject 100 Units into the skin 2 (two) times daily with a meal.   LYRICA 150 MG capsule Take 1 tablet by mouth 2 (two) times daily.   mirtazapine (REMERON) 15 MG tablet Take 15 mg by mouth at bedtime.   Misc. Devices (QUAD CANE/SMALL BASE) MISC Use daily   MOUNJARO 5 MG/0.5ML Pen Inject 5 mg into the skin once a week.   pantoprazole  (PROTONIX ) 20 MG tablet Take 1 tablet (20 mg total) by mouth daily.   primidone  (MYSOLINE ) 50 MG tablet Take 3 tablets (150 mg total) by mouth 2 (two) times daily as needed.   rosuvastatin  (CRESTOR ) 20 MG tablet TAKE 1 TABLET BY MOUTH EVERY DAY   traZODone  (DESYREL ) 150 MG tablet Take 1 tablet (150 mg total) by mouth at bedtime. NEEDS APPOINTMENT FOR FURTHER REFILLS.   valsartan  (DIOVAN ) 320 MG tablet Take 1 tablet (320 mg total) by mouth daily.   vitamin B-12 (CYANOCOBALAMIN) 1000 MCG tablet Take 1,000 mcg by mouth daily.    No facility-administered encounter medications on file as of 01/25/2024.     Objective:    PHYSICAL EXAMINATION:    VITALS:   There were no vitals filed for this visit.  GEN:  The patient appears stated age and is in NAD. HEENT:  Normocephalic, atraumatic.  The mucous membranes are moist.   Neurological examination:  Orientation: The patient is alert and oriented x3. Cranial nerves: There is good facial symmetry. The speech is fluent and clear. Soft palate rises symmetrically and there is no tongue deviation. Hearing is intact to conversational  tone. Sensation: Sensation is intact to light touch throughout Motor: Strength is at least antigravity x4.  Movement examination: Tone: There is normal tone in the UE/LE Abnormal movements: no rest tremor.  No postural tremor today.  No intention tremor today.  Archimedes spirals are without tremor. Coordination:  There is no decremation with RAM's  I have reviewed and interpreted the following labs independently   Chemistry      Component Value Date/Time   NA 141 12/28/2023 1153   K CANCELED 12/28/2023 1153   CL 100 12/28/2023 1153   CO2 23 12/28/2023 1153   BUN 17 12/28/2023 1153   CREATININE 0.85 12/28/2023 1153   CREATININE 0.99 04/01/2022 1053   GLU 84 07/20/2018 0000      Component Value Date/Time   CALCIUM  9.3 12/28/2023 1153   ALKPHOS 82  11/22/2022 1537   AST 23 11/22/2022 1537   ALT 18 11/22/2022 1537   BILITOT <0.2 11/22/2022 1537      Lab Results  Component Value Date   WBC 5.6 03/24/2022   HGB 12.3 03/24/2022   HCT 37.2 03/24/2022   MCV 89.6 03/24/2022   PLT 194 03/24/2022   Lab Results  Component Value Date   TSH 1.08 12/01/2021     Chemistry      Component Value Date/Time   NA 141 12/28/2023 1153   K CANCELED 12/28/2023 1153   CL 100 12/28/2023 1153   CO2 23 12/28/2023 1153   BUN 17 12/28/2023 1153   CREATININE 0.85 12/28/2023 1153   CREATININE 0.99 04/01/2022 1053   GLU 84 07/20/2018 0000      Component Value Date/Time   CALCIUM  9.3 12/28/2023 1153   ALKPHOS 82 11/22/2022 1537   AST 23 11/22/2022 1537   ALT 18 11/22/2022 1537   BILITOT <0.2 11/22/2022 1537       Cc:  Willo Mini, NP

## 2024-01-24 ENCOUNTER — Other Ambulatory Visit: Payer: Self-pay | Admitting: Medical-Surgical

## 2024-01-25 ENCOUNTER — Ambulatory Visit: Payer: 59 | Admitting: Neurology

## 2024-01-25 ENCOUNTER — Encounter: Payer: Self-pay | Admitting: Neurology

## 2024-01-25 DIAGNOSIS — Z029 Encounter for administrative examinations, unspecified: Secondary | ICD-10-CM

## 2024-01-31 ENCOUNTER — Encounter: Payer: Self-pay | Admitting: Medical-Surgical

## 2024-01-31 LAB — HM MAMMOGRAPHY

## 2024-02-01 ENCOUNTER — Other Ambulatory Visit: Payer: Self-pay | Admitting: Medical-Surgical

## 2024-02-01 DIAGNOSIS — E1169 Type 2 diabetes mellitus with other specified complication: Secondary | ICD-10-CM

## 2024-02-17 DIAGNOSIS — G4733 Obstructive sleep apnea (adult) (pediatric): Secondary | ICD-10-CM | POA: Diagnosis not present

## 2024-02-18 ENCOUNTER — Other Ambulatory Visit: Payer: Self-pay | Admitting: Medical-Surgical

## 2024-03-11 NOTE — Progress Notes (Signed)
 Dominique Russell                                          MRN: [id number]   03/11/2024   The VBCI Quality Team Specialist reviewed this patient medical record for the purposes of chart review for care gap closure. The following were reviewed: chart review for care gap closure-glycemic status assessment.    VBCI Quality Team

## 2024-03-14 ENCOUNTER — Other Ambulatory Visit: Payer: Self-pay

## 2024-03-14 DIAGNOSIS — E1169 Type 2 diabetes mellitus with other specified complication: Secondary | ICD-10-CM

## 2024-03-14 MED ORDER — PANTOPRAZOLE SODIUM 20 MG PO TBEC: 20.0000 mg | DELAYED_RELEASE_TABLET | Freq: Every day | ORAL | 1 refills | Status: AC

## 2024-03-14 MED ORDER — FLUTICASONE PROPIONATE 50 MCG/ACT NA SUSP: 2.0000 | Freq: Every day | NASAL | 2 refills | Status: DC

## 2024-03-14 MED ORDER — ROSUVASTATIN CALCIUM 20 MG PO TABS: 20.0000 mg | ORAL_TABLET | Freq: Every day | ORAL | 1 refills | Status: DC

## 2024-03-14 MED ORDER — CHLORTHALIDONE 25 MG PO TABS: 25.0000 mg | ORAL_TABLET | Freq: Every day | ORAL | 3 refills | Status: DC

## 2024-03-15 ENCOUNTER — Telehealth: Payer: Self-pay | Admitting: Neurology

## 2024-03-15 NOTE — Telephone Encounter (Signed)
 I left message on patient's voicemail to contact [facility] to have Rx sent to PCP, this is not a medication prescribed by Dr. Asberry Tat.

## 2024-03-15 NOTE — Telephone Encounter (Signed)
 Refill rqst from [facility] famotidine  (PEPCID ) 20 MG tablet send to [facility] pharmacy mail order fax [phone]

## 2024-03-15 NOTE — Progress Notes (Signed)
 RALPHINE HINKS                                          MRN: [id number]   03/15/2024   The VBCI Quality Team Specialist reviewed this patient medical record for the purposes of chart review for care gap closure. The following were reviewed: A1C was already submitted to [employer] and [employer] portal on 01/04/2024.    VBCI Quality Team

## 2024-03-19 ENCOUNTER — Other Ambulatory Visit: Payer: Self-pay

## 2024-03-19 MED ORDER — FLUTICASONE PROPIONATE 50 MCG/ACT NA SUSP: 2.0000 | Freq: Every day | NASAL | 2 refills | Status: DC

## 2024-03-19 MED ORDER — CHLORTHALIDONE 25 MG PO TABS: 25.0000 mg | ORAL_TABLET | Freq: Every day | ORAL | 1 refills | Status: AC

## 2024-03-22 ENCOUNTER — Telehealth: Payer: Self-pay

## 2024-03-22 ENCOUNTER — Other Ambulatory Visit: Payer: Self-pay | Admitting: Medical-Surgical

## 2024-03-22 ENCOUNTER — Ambulatory Visit: Admitting: Family Medicine

## 2024-03-22 DIAGNOSIS — E1169 Type 2 diabetes mellitus with other specified complication: Secondary | ICD-10-CM

## 2024-03-22 MED ORDER — ROSUVASTATIN CALCIUM 20 MG PO TABS: 20.0000 mg | ORAL_TABLET | Freq: Every day | ORAL | 1 refills | Status: DC

## 2024-03-22 NOTE — Telephone Encounter (Signed)
 Copied from CRM #[id number]. Topic: Clinical - Medication Refill >> Mar 22, 2024 12:37 PM [name] wrote: Medication:  DULoxetine  (CYMBALTA ) 60 MG capsule  cyclobenzaprine  (FLEXERIL ) 10 MG tablet LYRICA 150 MG capsule MOUNJARO 5 MG/0.5ML Pen  Has the patient contacted their pharmacy? Yes (Agent: If no, request that the patient contact the pharmacy for the refill. If patient does not wish to contact the pharmacy document the reason why and proceed with request.) (Agent: If yes, when and what did the pharmacy advise?) Pharmacy needs order to refill  This is the patient's preferred pharmacy:   [facility] - [place], [place] - [address] [place] [address] Phone: [phone] Fax: [phone]  Is this the correct pharmacy for this prescription? Yes If no, delete pharmacy and type the correct one.   Has the prescription been filled recently? No  Is the patient out of the medication? No  Has the patient been seen for an appointment in the last year OR does the patient have an upcoming appointment? Yes  Can we respond through MyChart? No  Agent: Please be advised that Rx refills may take up to 3 business days. We ask that you follow-up with your pharmacy.

## 2024-03-22 NOTE — Telephone Encounter (Signed)
 Sent prescription

## 2024-03-22 NOTE — Telephone Encounter (Signed)
 Copied from CRM [id number]. Topic: Clinical - Medication Refill >> Mar 22, 2024  2:22 PM Dominique Russell wrote: Medication: rosuvastatin  (CRESTOR ) 20 MG tablet [[id number]]  Has the patient contacted their pharmacy? Yes (Agent: If no, request that the patient contact the pharmacy for the refill. If patient does not wish to contact the pharmacy document the reason why and proceed with request.) (Agent: If yes, when and what did the pharmacy advise?)  This is the patient's preferred pharmacy:  [facility] [phone] - [place], [place] - [address] [place] [address] Phone: [phone] Fax: [phone]    Is this the correct pharmacy for this prescription? Yes If no, delete pharmacy and type the correct one.   Has the prescription been filled recently? Yes  Is the patient out of the medication? Yes  Has the patient been seen for an appointment in the last year OR does the patient have an upcoming appointment? Yes  Can we respond through MyChart? Yes  Agent: Please be advised that Rx refills may take up to 3 business days. We ask that you follow-up with your pharmacy.

## 2024-03-22 NOTE — Telephone Encounter (Signed)
 Left message for a return call. Joy has never prescribed Flexeril , Lyrica or Mounjaro. Joy hasn't prescribed duloxetine  since 2023.

## 2024-03-22 NOTE — Progress Notes (Signed)
 Dominique Russell                                          MRN: [id number]   03/22/2024   The VBCI Quality Team Specialist reviewed this patient medical record for the purposes of chart review for care gap closure. The following were reviewed: abstraction for care gap closure-glycemic status assessment.    VBCI Quality Team

## 2024-03-25 ENCOUNTER — Ambulatory Visit: Payer: Self-pay

## 2024-03-25 ENCOUNTER — Telehealth: Admitting: Physician Assistant

## 2024-03-25 ENCOUNTER — Ambulatory Visit: Admitting: Medical-Surgical

## 2024-03-25 ENCOUNTER — Telehealth: Admitting: Medical-Surgical

## 2024-03-25 ENCOUNTER — Encounter: Payer: Self-pay | Admitting: Medical-Surgical

## 2024-03-25 DIAGNOSIS — Z91199 Patient's noncompliance with other medical treatment and regimen due to unspecified reason: Secondary | ICD-10-CM

## 2024-03-25 NOTE — Telephone Encounter (Signed)
 FYI - the patient missed their VV with the provider for symptoms of an earache.   Per E2C2 Triage RN - Daughter working downstairs and has pt clinical cytogeneticist on her phone - could hear daughter yelling at her around the times she was supposed to be on her video visits. Daughter got stuck on call so couldn't bring phone to patient.

## 2024-03-25 NOTE — Progress Notes (Signed)
 Called pt and LVM advising her to sign on if she could.

## 2024-03-25 NOTE — Progress Notes (Signed)
 The patient no-showed for appointment despite this provider sending direct link, reaching out via phone with no response and waiting for at least 10 minutes from appointment time for patient to join. They will be marked as a NS for this appointment/time.   Laure Kidney, PA-C

## 2024-03-25 NOTE — Progress Notes (Signed)
 Provider joined the virtual waiting room at 2:41pm. Patient not present. Direct text link sent. At 2:49pm, patient still not present. MA was unable to get patient or her daughter on the phone for pre-visit check in. Provider called using [employer] virtual platform however call went to VM. VM left requesting use of the direct link to join the virtual platform. Instructed that if unable to join the virtual visit, she would need to be rescheduled for in office visit tomorrow. Provider rejoined virtual waiting room at 2:51pm. Patient did not join the visit. Provider signed off at 2:55pm. No show for virtual appointment.

## 2024-03-25 NOTE — Telephone Encounter (Signed)
 FYI Only or Action Required?: FYI only for provider: appointment scheduled on 2/2.  Patient was last seen in primary care on 03/25/2024 by Dominique Mini, NP.  Called Nurse Triage reporting Otalgia.  Symptoms began a week ago.  Interventions attempted: Rest, hydration, or home remedies.  Symptoms are: gradually worsening.  Triage Disposition: See Physician Within 24 Hours  Patient/caregiver understands and will follow disposition?: Yes  Message from [name] sent at 03/25/2024  4:01 PM EST  Reason for Triage: ear ache nose running, feeling bad   Reason for Disposition  Earache  (Exceptions: Brief ear pain of lasting less than 60 minutes, or earache occurring during air travel.)  Answer Assessment - Initial Assessment Questions Patient with earache and runny nose, sinus congestion for one week. Left ear worse than right. Has cotton ball in left ear so no air gets in.  Ear pain 10/10. Denies cough, CP, SOB, Fever. Reports some Sweating. Endorses Dizziness when trying to stand-- stands still for a few moments before can move.  Daughter working downstairs and has pt clinical cytogeneticist on her phone - could hear daughter yelling at her around the times she was supposed to be on her video visits. Daughter got stuck on call so couldn't bring phone to patient.  VUC appt made for this evening after daughter completes work. Patient aware will need to go to UC or into OV if cannot make VV work.   1. LOCATION: Which ear is involved?     Left ear worse than right  2. ONSET: When did the ear pain start?      Week  3. SEVERITY: How bad is the pain?  (Scale 1-10; mild, moderate or severe)     10/10 4. URI SYMPTOMS: Do you have a runny nose or cough?     Runny nose 5. FEVER: Do you have a fever? If Yes, ask: What is your temperature, how was it measured, and when did it start?     Denies  6. CAUSE: Have you been swimming recently?, How often do you use Q-TIPS?, Have you had any recent air  travel or scuba diving?     Ear infection  7. OTHER SYMPTOMS: Do you have any other symptoms? (e.g., decreased hearing, dizziness, headache, stiff neck, vomiting)     Sinus Congestion, slight headache,, runny nose (white/clear)  Protocols used: Rilla

## 2024-03-27 NOTE — Telephone Encounter (Signed)
 Attempted to contacted the patient regarding the provider's note for an in-office visit if patient is still experiencing ear pain. No answer, left a detailed vm msg to return a call back to the clinic to schedule an appointment as needed. Direct call back information provided.

## 2024-03-28 ENCOUNTER — Encounter: Payer: Self-pay | Admitting: Urgent Care

## 2024-03-28 ENCOUNTER — Ambulatory Visit: Admitting: Urgent Care

## 2024-03-28 VITALS — BP 122/76 | HR 93 | Temp 98.1°F | Ht 63.5 in | Wt 232.0 lb

## 2024-03-28 DIAGNOSIS — H6592 Unspecified nonsuppurative otitis media, left ear: Secondary | ICD-10-CM

## 2024-03-28 DIAGNOSIS — R296 Repeated falls: Secondary | ICD-10-CM

## 2024-03-28 DIAGNOSIS — J22 Unspecified acute lower respiratory infection: Secondary | ICD-10-CM

## 2024-03-28 DIAGNOSIS — E1169 Type 2 diabetes mellitus with other specified complication: Secondary | ICD-10-CM

## 2024-03-28 DIAGNOSIS — J3489 Other specified disorders of nose and nasal sinuses: Secondary | ICD-10-CM

## 2024-03-28 MED ORDER — ROSUVASTATIN CALCIUM 20 MG PO TABS: 20.0000 mg | ORAL_TABLET | Freq: Every day | ORAL | 1 refills | Status: AC

## 2024-03-28 MED ORDER — AMOXICILLIN-POT CLAVULANATE 875-125 MG PO TABS: 1.0000 | ORAL_TABLET | Freq: Two times a day (BID) | ORAL | 0 refills | Status: AC

## 2024-03-28 MED ORDER — FLUTICASONE PROPIONATE 50 MCG/ACT NA SUSP: 2.0000 | Freq: Every day | NASAL | 6 refills | Status: AC
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
## 2024-03-28 NOTE — Patient Instructions (Addendum)
 You have a lower respiratory tract infection. Please start taking the antibiotic, Augmentin , twice daily with food. Take it for all 10 days, do not stop early just because you feel better. Take an over the counter probiotic or yogurt daily to help prevent diarrhea/ yeast infection.  Use Flonase  daily to help with inflammation of the nasal passage. It is also recommended that you use nasal saline/ sinus washes to cleans the sinus passages. Hot steam from a shower or vaporizer may also be beneficial to help open up the upper airway. Eucalyptus can be helpful.

## 2024-03-28 NOTE — Telephone Encounter (Signed)
 The patient was scheduled an appointment with PA Whitney on 03/28/24 for the following symptoms. Note will be closed.

## 2024-03-29 ENCOUNTER — Telehealth: Payer: Self-pay

## 2024-03-29 NOTE — Telephone Encounter (Signed)
 Attempted to start the order through parachute Primary insurance is not able to complete eligibility [facility] medicare)   Put in a call to patient daughter to see if patient has new insurance for this year or if the card # had changed.  Also wanting to know if she has preference as to where the cane order is sent.   Left a voicemail message requesting a return call.

## 2024-03-29 NOTE — Telephone Encounter (Signed)
 Message receveived from Teresita crain as below are you able to help me get this patient a 4 pronged cane? I do not recall where she wanted it sent, but maybe we can order it in parachute? I did place an order in her chart.

## 2024-03-29 NOTE — Telephone Encounter (Signed)
 Order went through for Quad cane with parachute to [employer] [facility]. Patient insurance was verified.

## 2024-03-29 NOTE — Telephone Encounter (Signed)
 Spoke with patient.  She will call [facility] medicare and check on why this is showing ineligible. She will call me back at my direct # and let me know what she finds out.  Once the insurance is resolved she is requesting to have the order for the cane sent to [employer] [facility] .  Will await the patient's return call.

## 2024-05-27 ENCOUNTER — Ambulatory Visit: Admitting: Neurology

## 2024-06-27 ENCOUNTER — Ambulatory Visit: Admitting: Medical-Surgical
